# Patient Record
Sex: Male | Born: 2000
Health system: Southern US, Community
[De-identification: ages and names within clinical notes are randomized; demographics above are authoritative.]

---
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
## 2019-05-11 ENCOUNTER — Emergency Department (HOSPITAL_COMMUNITY): Payer: Medicaid Other

## 2019-05-11 ENCOUNTER — Encounter (HOSPITAL_COMMUNITY): Payer: Self-pay | Admitting: Emergency Medicine

## 2019-05-11 ENCOUNTER — Other Ambulatory Visit: Payer: Self-pay

## 2019-05-11 ENCOUNTER — Emergency Department (HOSPITAL_COMMUNITY)
Admission: EM | Admit: 2019-05-11 | Discharge: 2019-05-11 | Disposition: A | Discharge status: Home / Self Care | Payer: Medicaid Other | Attending: Emergency Medicine | Admitting: Emergency Medicine

## 2019-05-11 ENCOUNTER — Telehealth: Payer: Self-pay | Admitting: Physician Assistant

## 2019-05-11 DIAGNOSIS — R590 Localized enlarged lymph nodes: Secondary | ICD-10-CM | POA: Diagnosis not present

## 2019-05-11 DIAGNOSIS — R221 Localized swelling, mass and lump, neck: Secondary | ICD-10-CM | POA: Diagnosis present

## 2019-05-11 DIAGNOSIS — R591 Generalized enlarged lymph nodes: Secondary | ICD-10-CM

## 2019-05-11 LAB — COMPREHENSIVE METABOLIC PANEL
ALT: 42 U/L (ref 0–44)
AST: 30 U/L (ref 15–41)
Albumin: 2.3 g/dL — ABNORMAL LOW (ref 3.5–5.0)
Alkaline Phosphatase: 216 U/L — ABNORMAL HIGH (ref 38–126)
Anion gap: 12 (ref 5–15)
BUN: <5 mg/dL — ABNORMAL LOW (ref 6–20)
CO2: 25 mmol/L (ref 22–32)
Calcium: 8.8 mg/dL — ABNORMAL LOW (ref 8.9–10.3)
Chloride: 95 mmol/L — ABNORMAL LOW (ref 98–111)
Creatinine, Ser: 0.62 mg/dL (ref 0.61–1.24)
GFR calc Af Amer: >60 mL/min (ref >60)
GFR calc non Af Amer: >60 mL/min (ref >60)
Glucose, Bld: 94 mg/dL (ref 70–99)
Potassium: 3.9 mmol/L (ref 3.5–5.1)
Sodium: 132 mmol/L — ABNORMAL LOW (ref 135–145)
Total Bilirubin: 0.4 mg/dL (ref 0.3–1.2)
Total Protein: 8.8 g/dL — ABNORMAL HIGH (ref 6.5–8.1)

## 2019-05-11 LAB — CBC WITH DIFFERENTIAL/PLATELET
Abs Immature Granulocytes: 0 10*3/uL (ref 0.00–0.07)
Basophils Absolute: 0 10*3/uL (ref 0.0–0.1)
Basophils Relative: 0 %
Eosinophils Absolute: 0 10*3/uL (ref 0.0–0.5)
Eosinophils Relative: 0 %
HCT: 27.5 % — ABNORMAL LOW (ref 39.0–52.0)
Hemoglobin: 8.3 g/dL — ABNORMAL LOW (ref 13.0–17.0)
Lymphocytes Relative: 5 %
Lymphs Abs: 1.3 10*3/uL (ref 0.7–4.0)
MCH: 22.4 pg — ABNORMAL LOW (ref 26.0–34.0)
MCHC: 30.2 g/dL (ref 30.0–36.0)
MCV: 74.1 fL — ABNORMAL LOW (ref 80.0–100.0)
Monocytes Absolute: 1.5 10*3/uL — ABNORMAL HIGH (ref 0.1–1.0)
Monocytes Relative: 6 %
Neutro Abs: 22.7 10*3/uL — ABNORMAL HIGH (ref 1.7–7.7)
Neutrophils Relative %: 89 %
Platelets: 478 10*3/uL — ABNORMAL HIGH (ref 150–400)
RBC: 3.71 MIL/uL — ABNORMAL LOW (ref 4.22–5.81)
RDW: 18.8 % — ABNORMAL HIGH (ref 11.5–15.5)
WBC: 25.5 10*3/uL — ABNORMAL HIGH (ref 4.0–10.5)
nRBC: 0 % (ref 0.0–0.2)
nRBC: 0 /100 WBC

## 2019-05-11 LAB — LIPASE, BLOOD: Lipase: 18 U/L (ref 11–51)

## 2019-05-11 MED ORDER — ONDANSETRON 4 MG PO TBDP: ORAL_TABLET | ORAL | 0 refills | Status: DC

## 2019-05-11 MED ORDER — IOHEXOL 350 MG/ML SOLN
100.0000 mL | Freq: Once | INTRAVENOUS | Status: AC | PRN
  Administered 2019-05-11: 100 mL via INTRAVENOUS

## 2019-05-11 MED ORDER — SODIUM CHLORIDE 0.9 % IV BOLUS
1000.0000 mL | Freq: Once | INTRAVENOUS | Status: AC
  Administered 2019-05-11: 1000 mL via INTRAVENOUS

## 2019-05-11 MED ORDER — SODIUM CHLORIDE 0.9 % IV SOLN
Freq: Once | INTRAVENOUS | Status: AC
  Administered 2019-05-11: 17:00:00 via INTRAVENOUS

## 2019-05-11 MED ORDER — IOHEXOL 300 MG/ML  SOLN
75.0000 mL | Freq: Once | INTRAMUSCULAR | Status: AC | PRN
Start: 2019-05-11 — End: 2019-05-11
  Administered 2019-05-11: 75 mL via INTRAVENOUS

## 2019-05-11 NOTE — Discharge Instructions (Signed)
Your CT scans are concerning for lymphoma and your lab work today shows increased white blood cells and low red blood cells which can also be seen with lymphoma.  You should receive a phone call from the cancer center for follow-up appointment in the next few days, if you do not hear from them please give them a call.  Return to the ED for any new or worsening symptoms.

## 2019-05-11 NOTE — ED Provider Notes (Addendum)
[facility] EMERGENCY DEPARTMENT Provider Note   CSN: [id number] Arrival date & time: 05/11/19  1216     History   Chief Complaint Chief Complaint  Patient presents with   neck swelling    HPI Child Campoy is a 18 y.o. male.     Jatavious Cartwright is a 18 y.o. male with a history of obesity, depression and ODD, who presents for evaluation of a left sided neck mass. Patient reports about 3-4 months ago he noticed a small movable bump at the base of the left side of his neck.  Over the past few months it has become increasingly large and intermittently painful.  He reports it is somewhat movable.  He reports since it has gotten larger it has caused him more pain.  He denies it causing sore throat or difficulty breathing.  He has not had any associated fevers or chills.  But does report that since this started he has noted that he has had a more sensitive stomach with frequent nausea and intermittent diarrhea, he reports some intermittent generalized abdominal pains but no focal pain in one area, and denies any current abdominal pain.  Denies any pain in his chest or shortness of breath.  He does report that over the past few months he has noted an unintentional weight loss of an estimated 20 pounds.  He reports that he thinks his mom had a similar issue that they said was cancer and she had to have a surgery on her neck.  He reports he does not have insurance and has avoided coming in regarding this, he has never been in hospital before and is very anxious.     History reviewed. No pertinent past medical history.  Patient Active Problem List   Diagnosis Date Noted   Morbid obesity ([place]) 05/31/2015   MDD (major depressive disorder), single episode, severe ([place]) 05/26/2015   ODD (oppositional defiant disorder) 05/25/2015    History reviewed. No pertinent surgical history.      Home Medications    Prior to Admission medications   Medication Sig Start  Date End Date Taking? Authorizing Provider  ondansetron (ZOFRAN ODT) 4 MG disintegrating tablet 40m ODT q4 hours prn nausea/vomit 05/11/19   FJacqlyn Larsen PA-C    Family History No family history on file.  Social History Social History   Tobacco Use   Smoking status: Never Smoker  Substance Use Topics   Alcohol use: No   Drug use: No     Allergies   Patient has no known allergies.   Review of Systems Review of Systems  Constitutional: Positive for appetite change and unexpected weight change. Negative for chills and fever.  HENT: Negative for facial swelling, sore throat, trouble swallowing and voice change.   Respiratory: Negative for cough and shortness of breath.   Cardiovascular: Negative for chest pain.  Gastrointestinal: Positive for abdominal pain, diarrhea, nausea and vomiting. Negative for blood in stool and constipation.  Musculoskeletal: Positive for neck pain (Neck mass). Negative for arthralgias and back pain.  Skin: Negative for color change, rash and wound.  Neurological: Negative for headaches.  All other systems reviewed and are negative.    Physical Exam Updated Vital Signs BP (!) 147/82    Pulse (!) 118    Temp 98.5 F (36.9 C) (Oral)    Resp 15    Ht 6' (1.829 m)    Wt 127 kg    SpO2 100%    BMI 37.97 kg/m  Physical Exam Vitals signs and nursing note reviewed.  Constitutional:      General: He is not in acute distress.    Appearance: Normal appearance. He is well-developed. He is not ill-appearing or diaphoretic.  HENT:     Head: Normocephalic and atraumatic.     Mouth/Throat:     Mouth: Mucous membranes are moist.     Pharynx: Oropharynx is clear.     Comments: Posterior oropharynx clear with no erythema, edema, or exudates.  Uvula midline.  Tolerating secretions, membranes moist. Eyes:     General:        Right eye: No discharge.        Left eye: No discharge.     Pupils: Pupils are equal, round, and reactive to light.  Neck:      Musculoskeletal: Neck supple.     Comments: Bilateral significantly enlarged lymph nodes palpable at the base of the neck, left worse than right, slightly tender to palpation, movable on exam.  No overlying skin changes. No stridor on auscultation Cardiovascular:     Rate and Rhythm: Regular rhythm. Tachycardia present.     Heart sounds: Normal heart sounds.     Comments: Tachycardia with regular rhythm Pulmonary:     Effort: Pulmonary effort is normal. No respiratory distress.     Breath sounds: Normal breath sounds. No wheezing or rales.     Comments: Respirations equal and unlabored, patient able to speak in full sentences, lungs clear to auscultation bilaterally Abdominal:     General: Bowel sounds are normal. There is no distension.     Palpations: Abdomen is soft. There is no mass.     Tenderness: There is no abdominal tenderness. There is no guarding.     Comments: Abdomen soft, nondistended, nontender to palpation in all quadrants without guarding or peritoneal signs  Musculoskeletal:        General: No deformity.  Lymphadenopathy:     Cervical: Cervical adenopathy present.  Skin:    General: Skin is warm and dry.     Capillary Refill: Capillary refill takes less than 2 seconds.  Neurological:     Mental Status: He is alert.     Coordination: Coordination normal.     Comments: Speech is clear, able to follow commands Moves extremities without ataxia, coordination intact  Psychiatric:        Mood and Affect: Mood is anxious.        Behavior: Behavior normal.      ED Treatments / Results  Labs (all labs ordered are listed, but only abnormal results are displayed) Labs Reviewed  CBC WITH DIFFERENTIAL/PLATELET - Abnormal; Notable for the following components:      Result Value   WBC 25.5 (*)    RBC 3.71 (*)    Hemoglobin 8.3 (*)    HCT 27.5 (*)    MCV 74.1 (*)    MCH 22.4 (*)    RDW 18.8 (*)    Platelets 478 (*)    Neutro Abs 22.7 (*)    Monocytes Absolute 1.5  (*)    All other components within normal limits  COMPREHENSIVE METABOLIC PANEL - Abnormal; Notable for the following components:   Sodium 132 (*)    Chloride 95 (*)    BUN <5 (*)    Calcium 8.8 (*)    Total Protein 8.8 (*)    Albumin 2.3 (*)    Alkaline Phosphatase 216 (*)    All other components within normal limits  LIPASE,  BLOOD    EKG None  Radiology Ct Soft Tissue Neck W Contrast  Result Date: 05/11/2019 CLINICAL DATA:  Painful left-sided neck mass. EXAM: CT NECK WITH CONTRAST TECHNIQUE: Multidetector CT imaging of the neck was performed using the standard protocol following the bolus administration of intravenous contrast. CONTRAST:  24m OMNIPAQUE IOHEXOL 300 MG/ML  SOLN COMPARISON:  None. FINDINGS: Pharynx and larynx: No focal mucosal or submucosal lesions are present. Adenoid tissue is within normal limits. Soft palate and tongue base are normal. Vallecular is normal. Hypopharynx is within normal limits. Vocal cords are midline and symmetric. The trachea is normal. Salivary glands: Submandibular and parotid glands and ducts are within normal limits. Thyroid: Normal Lymph nodes: Multiple bulky lymph nodes are present bilaterally. Homogeneous node in the left level 2 station is likely palpable, measuring 3.4 x 2.3 x 3.0 cm. Left posterior level 2 and level 3 lymph node measures up to 3.6 cm. A left level 3 node or nodal mass measures 4.6 x 3.7 x 5.3 cm. Enlarged supraclavicular nodes are present bilaterally, right greater than left. Marked adenopathy extends into the upper mediastinum. The largest right paratracheal node measures 5.2 x 3.5 cm. Left prevascular nodes measure up to 4.3 x 4.1 cm. Vascular: The right internal jugular vein is displaced anteriorly without occlusion. No arterial lesions are present. Limited intracranial: Within normal limits. Visualized orbits: The globes and orbits are within normal limits. Mastoids and visualized paranasal sinuses: The paranasal sinuses and  mastoid air cells are clear. Skeleton: Vertebral body heights alignment are normal. No focal lytic or blastic lesions are present. Upper chest: The lung apices are clear. IMPRESSION: 1. Extensive bulky adenopathy throughout the neck bilaterally, right greater than left. This is most concerning for a lymphoproliferative disorder such as lymphoma. 2. No primary lesion is evident. 3. The right internal jugular vein is displaced anteriorly without occlusion. These results were called by telephone at the time of interpretation on 05/11/2019 at 3:30 pm to provider [facility] , who verbally acknowledged these results. Electronically Signed   By: CSan MorelleM.D.   On: 05/11/2019 15:33   Ct Angio Chest Pe W And/or Wo Contrast  Result Date: 05/11/2019 CLINICAL DATA:  New diagnosis of probable lymphoma in the neck. Tachycardia. Rule out pulmonary embolism. Evaluate for adenopathy. EXAM: CT ANGIOGRAPHY CHEST CT ABDOMEN AND PELVIS WITH CONTRAST TECHNIQUE: Multidetector CT imaging of the chest was performed using the standard protocol during bolus administration of intravenous contrast. Multiplanar CT image reconstructions and MIPs were obtained to evaluate the vascular anatomy. Multidetector CT imaging of the abdomen and pelvis was performed using the standard protocol during bolus administration of intravenous contrast. CONTRAST:  1072mOMNIPAQUE IOHEXOL 350 MG/ML SOLN COMPARISON:  None. FINDINGS: CTA CHEST FINDINGS Cardiovascular: The quality of this exam for evaluation of pulmonary embolism is poor to moderate. The bolus is suboptimally timed, with contrast centered in the SVC. No pulmonary embolism to the large lobar level. Normal aortic caliber. Mild cardiomegaly. Small pericardial effusion. Mediastinum/Nodes: Massive low cervical/supraclavicular adenopathy, as on dedicated neck CT. Left axillary adenopathy including at 1.5 cm on 29/5. Confluent mediastinal adenopathy, with a nodal conglomerate in the  prevascular space measuring on the order of 8.9 x 6.2 cm on 41/5. Right paratracheal node measures 4.4 cm on image 43/5. Right hilar adenopathy at 2.8 cm on 56/5. Right paravertebral adenopathy versus extramedullary hematopoiesis. Example at 1.9 x 4.2 cm on 120/5. Lungs/Pleura: No pleural fluid. Pleural-based nodules, including the right upper lobe at 7  mm on 57/6 most likely represent subpleural lymph nodes. No lobar consolidation. Musculoskeletal: Moderate bilateral gynecomastia. No acute osseous abnormality. Review of the MIP images confirms the above findings. CT ABDOMEN and PELVIS FINDINGS Hepatobiliary: Hepatomegaly at 23.8 cm craniocaudal. Mild hepatic steatosis with sparing adjacent the gallbladder. Normal gallbladder, without biliary ductal dilatation. Pancreas: Normal, without mass or ductal dilatation. Spleen: Splenomegaly, including at 16.7 x 10.7 x 13.2 cm. Heterogeneous splenic density, likely due to lymphomatous involvement. Example vague inferior splenic mass of 4.0 cm on 37/3. Adrenals/Urinary Tract: Normal adrenal glands. Normal kidneys, without hydronephrosis. Normal urinary bladder. Stomach/Bowel: Normal stomach, without wall thickening. Normal colon, appendix, and terminal ileum. Normal small bowel. Vascular/Lymphatic: Normal caliber of the aorta and branch vessels. Abdominal retroperitoneal adenopathy, with a 1.8 cm left periaortic node on 38/3. Porta hepatis node of 1.2 cm on 33/3. No pelvic sidewall adenopathy. Reproductive: Normal prostate. Other: No significant free fluid. Musculoskeletal: No acute osseous abnormality. Review of the MIP images confirms the above findings. IMPRESSION: 1. suboptimal evaluation for pulmonary embolism. No large embolism as detailed above. 2. Adenopathy within the chest and abdomen, consistent with lymphoma. 3. Hepatosplenomegaly with heterogeneous splenic enhancement/attenuation, suspicious for splenic involvement. 4. Paravertebral adenopathy versus  extramedullary hematopoiesis. 5. Bilateral gynecomastia. Electronically Signed   By: Abigail Miyamoto M.D.   On: 05/11/2019 17:26   Ct Abdomen Pelvis W Contrast  Result Date: 05/11/2019 CLINICAL DATA:  New diagnosis of probable lymphoma in the neck. Tachycardia. Rule out pulmonary embolism. Evaluate for adenopathy. EXAM: CT ANGIOGRAPHY CHEST CT ABDOMEN AND PELVIS WITH CONTRAST TECHNIQUE: Multidetector CT imaging of the chest was performed using the standard protocol during bolus administration of intravenous contrast. Multiplanar CT image reconstructions and MIPs were obtained to evaluate the vascular anatomy. Multidetector CT imaging of the abdomen and pelvis was performed using the standard protocol during bolus administration of intravenous contrast. CONTRAST:  173m OMNIPAQUE IOHEXOL 350 MG/ML SOLN COMPARISON:  None. FINDINGS: CTA CHEST FINDINGS Cardiovascular: The quality of this exam for evaluation of pulmonary embolism is poor to moderate. The bolus is suboptimally timed, with contrast centered in the SVC. No pulmonary embolism to the large lobar level. Normal aortic caliber. Mild cardiomegaly. Small pericardial effusion. Mediastinum/Nodes: Massive low cervical/supraclavicular adenopathy, as on dedicated neck CT. Left axillary adenopathy including at 1.5 cm on 29/5. Confluent mediastinal adenopathy, with a nodal conglomerate in the prevascular space measuring on the order of 8.9 x 6.2 cm on 41/5. Right paratracheal node measures 4.4 cm on image 43/5. Right hilar adenopathy at 2.8 cm on 56/5. Right paravertebral adenopathy versus extramedullary hematopoiesis. Example at 1.9 x 4.2 cm on 120/5. Lungs/Pleura: No pleural fluid. Pleural-based nodules, including the right upper lobe at 7 mm on 57/6 most likely represent subpleural lymph nodes. No lobar consolidation. Musculoskeletal: Moderate bilateral gynecomastia. No acute osseous abnormality. Review of the MIP images confirms the above findings. CT ABDOMEN and  PELVIS FINDINGS Hepatobiliary: Hepatomegaly at 23.8 cm craniocaudal. Mild hepatic steatosis with sparing adjacent the gallbladder. Normal gallbladder, without biliary ductal dilatation. Pancreas: Normal, without mass or ductal dilatation. Spleen: Splenomegaly, including at 16.7 x 10.7 x 13.2 cm. Heterogeneous splenic density, likely due to lymphomatous involvement. Example vague inferior splenic mass of 4.0 cm on 37/3. Adrenals/Urinary Tract: Normal adrenal glands. Normal kidneys, without hydronephrosis. Normal urinary bladder. Stomach/Bowel: Normal stomach, without wall thickening. Normal colon, appendix, and terminal ileum. Normal small bowel. Vascular/Lymphatic: Normal caliber of the aorta and branch vessels. Abdominal retroperitoneal adenopathy, with a 1.8 cm left periaortic node on 38/3.  Porta hepatis node of 1.2 cm on 33/3. No pelvic sidewall adenopathy. Reproductive: Normal prostate. Other: No significant free fluid. Musculoskeletal: No acute osseous abnormality. Review of the MIP images confirms the above findings. IMPRESSION: 1. suboptimal evaluation for pulmonary embolism. No large embolism as detailed above. 2. Adenopathy within the chest and abdomen, consistent with lymphoma. 3. Hepatosplenomegaly with heterogeneous splenic enhancement/attenuation, suspicious for splenic involvement. 4. Paravertebral adenopathy versus extramedullary hematopoiesis. 5. Bilateral gynecomastia. Electronically Signed   By: Abigail Miyamoto M.D.   On: 05/11/2019 17:26    Procedures Procedures (including critical care time)  Medications Ordered in ED Medications  sodium chloride 0.9 % bolus 1,000 mL (0 mLs Intravenous Stopped 05/11/19 1704)  iohexol (OMNIPAQUE) 300 MG/ML solution 75 mL (75 mLs Intravenous Contrast Given 05/11/19 1519)  0.9 %  sodium chloride infusion ( Intravenous Stopped 05/11/19 1845)  iohexol (OMNIPAQUE) 350 MG/ML injection 100 mL (100 mLs Intravenous Contrast Given 05/11/19 1653)     Initial  Impression / Assessment and Plan / ED Course  I have reviewed the triage vital signs and the nursing notes.  Pertinent labs & imaging results that were available during my care of the patient were reviewed by me and considered in my medical decision making (see chart for details).  18 year old male presents with large mass at the base of his neck suspicious for enlarged lymph nodes he has similar but smaller mass on the left as well as some additional smaller cervical lymphadenopathy.  Patient reports this has been enlarging over the past 3 to 4 months.  Patient also reports associated night sweats and a 20 pound weight loss over the past 3 to 4 months.  On arrival patient is tachycardic but afebrile, he does endorse that he is very anxious and does not like coming to hospitals.  He has no other focal infectious symptoms.  Posterior oropharynx is clear and patient has no stridor.  Lungs are clear and abdomen is nontender to palpation.  Patient does report that he has had some frequent nausea and vomiting and some occasional diarrhea but does not have any current abdominal pain.  Will get basic labs and lipase and CT soft tissue neck with contrast to evaluate neck masses, very concerned for enlarged lymph nodes and with patient's associated weight loss and night sweats concern for potential lymphoma.  Labs show a leukocytosis of 25.5 with left shift and new anemia of 8.3, platelets of 478, again this raises concern for hematologic cancer.  Patient does not have fevers or focal infectious symptoms to suggest infectious etiology for leukocytosis.  Patient with mild hyponatremia and hypochloremia, given IV fluids, alk phos elevated, all other LFTs normal and patient without focal right upper quadrant tenderness, suspect this is in the setting of malignancy.  CT soft tissue neck with extensive enlarged lymph nodes in the neck bilaterally concerning for a lymphoproliferative disorder such as lymphoma, no evident  primary lesion.  The right internal jugular vein is displaced anteriorly but no evidence of occlusion.  I was called by Dr. Lawrence Santiago with radiology regarding these results.  Will consult oncology.  Case discussed with Dr. Earlie Server with oncology who recommends getting CTs of the chest abdomen and pelvis, but feels that this can be treated outpatient and patient will not require admission for further evaluation.  Given patient's tachycardia will get CT angio of the chest to rule out PE and CT abdomen pelvis with contrast to help better characterize spread of potential lymphoma.  CTA suboptimal but shows  no large PE patient is not having any chest pain or shortness of breath., adenopathy within the chest and abdomen consistent with lymphoma as well as hepatosplenomegaly with heterogeneous splenic enhancement suggestive of spleen involvement.  There is also paravertebral adenopathy versus extra medullary hematopoiesis noted.  Discussed results with patient and the concern for lymphoma.  Patient expresses understanding and I provided ample time to ask questions. I stressed the importance of follow-up, discussed with patient that he should hear from the oncology clinic in the next few days but if not he will call them to schedule follow-up.  Will prescribe Zofran for patient's intermittent nausea and vomiting.  Encouraged frequent small meals and drinking plenty of water.  Return precautions discussed.  Patient expresses understanding and agreement, discharged home in good condition.  Final Clinical Impressions(s) / ED Diagnoses   Final diagnoses:  Lymphadenopathy    ED Discharge Orders         Ordered    Ambulatory referral to Hematology / Oncology    Comments: Newly diagnosed lymphoma, discussed with Dr. Earlie Server   05/11/19 1816    ondansetron (ZOFRAN ODT) 4 MG disintegrating tablet     05/11/19 1843           Jacqlyn Larsen, PA-C 05/11/19 1904    Jacqlyn Larsen, PA-C 05/11/19  [place], DO 05/12/19 [phone]

## 2019-05-11 NOTE — ED Notes (Signed)
Patient transported to CT 

## 2019-05-11 NOTE — ED Triage Notes (Signed)
Pt reports swelling to L side of neck for the past few months, states lesion was initially a small bump under his skin that was moveable and lesion has gotten larger.

## 2019-05-11 NOTE — Telephone Encounter (Signed)
Received a new patient referral via staff msg from Dr. Julien Nordmann to schedule an appt w/either him or Cassie for lymphoma. I scheduled Terry Quinn to see Cassie on 12/10 at 1pm. Pt is currently in the hospital.

## 2019-05-13 ENCOUNTER — Ambulatory Visit: Payer: Self-pay | Admitting: Physician Assistant

## 2019-05-14 ENCOUNTER — Telehealth: Payer: Self-pay | Admitting: Physician Assistant

## 2019-05-14 ENCOUNTER — Telehealth: Payer: Self-pay | Admitting: Internal Medicine

## 2019-05-14 ENCOUNTER — Ambulatory Visit: Payer: Self-pay | Admitting: Physician Assistant

## 2019-05-14 ENCOUNTER — Telehealth: Payer: Self-pay | Admitting: *Deleted

## 2019-05-14 NOTE — Telephone Encounter (Signed)
Mr. Terry Quinn has been cld and rescheduled to see Dr. Julien Nordmann on 12/11 at [facility]. Pt aware to arrive 15 minutes early.

## 2019-05-14 NOTE — Telephone Encounter (Signed)
Received call from pt stating that he missed call yesterday.  Informed of appt today.  He states he needs to r/s.  Message to new pt coordinator.

## 2019-05-14 NOTE — Telephone Encounter (Signed)
Pt has been moved to Cassie's schedule for 12/11 at 9am.

## 2019-05-15 ENCOUNTER — Telehealth: Payer: Self-pay | Admitting: Physician Assistant

## 2019-05-15 ENCOUNTER — Ambulatory Visit: Payer: Self-pay | Admitting: Physician Assistant

## 2019-05-15 ENCOUNTER — Other Ambulatory Visit: Payer: Self-pay | Admitting: Physician Assistant

## 2019-05-15 DIAGNOSIS — R591 Generalized enlarged lymph nodes: Secondary | ICD-10-CM

## 2019-05-15 NOTE — Telephone Encounter (Signed)
I spoke to the patient about his appointment today. The patient did not show up for either of his scheduled appointments on 12/10 and 12/11. The patient stated that he did not have a ride to his appointment today and got on the incorrect bus. I discussed with the patient that we would still like to see him today if he is able to make it here. He stated that he could not come in today. I will reach out to our transportation coordinator to see if she is able to assist him with his transportation needs. We will reschedule him for Monday 05/18/2019 at 11:30. He expressed understanding.

## 2019-05-15 NOTE — Telephone Encounter (Signed)
Pt has been rescheduled to see Cassie on 12/14 at 1130am w/labs at 11am.

## 2019-05-18 ENCOUNTER — Inpatient Hospital Stay: Payer: Medicaid Other

## 2019-05-18 ENCOUNTER — Telehealth: Payer: Self-pay | Admitting: Physician Assistant

## 2019-05-18 ENCOUNTER — Other Ambulatory Visit: Payer: Self-pay

## 2019-05-18 ENCOUNTER — Inpatient Hospital Stay: Payer: Medicaid Other | Attending: Physician Assistant | Admitting: Physician Assistant

## 2019-05-18 VITALS — BP 135/76 | HR 109 | Temp 98.0°F | Resp 18 | Ht 72.0 in | Wt 224.4 lb

## 2019-05-18 DIAGNOSIS — K760 Fatty (change of) liver, not elsewhere classified: Secondary | ICD-10-CM | POA: Diagnosis not present

## 2019-05-18 DIAGNOSIS — R911 Solitary pulmonary nodule: Secondary | ICD-10-CM | POA: Diagnosis not present

## 2019-05-18 DIAGNOSIS — Z560 Unemployment, unspecified: Secondary | ICD-10-CM | POA: Insufficient documentation

## 2019-05-18 DIAGNOSIS — R590 Localized enlarged lymph nodes: Secondary | ICD-10-CM | POA: Insufficient documentation

## 2019-05-18 DIAGNOSIS — Z20828 Contact with and (suspected) exposure to other viral communicable diseases: Secondary | ICD-10-CM | POA: Insufficient documentation

## 2019-05-18 DIAGNOSIS — R591 Generalized enlarged lymph nodes: Secondary | ICD-10-CM | POA: Diagnosis not present

## 2019-05-18 DIAGNOSIS — R0609 Other forms of dyspnea: Secondary | ICD-10-CM | POA: Diagnosis not present

## 2019-05-18 DIAGNOSIS — R5383 Other fatigue: Secondary | ICD-10-CM | POA: Insufficient documentation

## 2019-05-18 DIAGNOSIS — D649 Anemia, unspecified: Secondary | ICD-10-CM | POA: Insufficient documentation

## 2019-05-18 DIAGNOSIS — R162 Hepatomegaly with splenomegaly, not elsewhere classified: Secondary | ICD-10-CM | POA: Insufficient documentation

## 2019-05-18 DIAGNOSIS — R000 Tachycardia, unspecified: Secondary | ICD-10-CM | POA: Insufficient documentation

## 2019-05-18 DIAGNOSIS — R112 Nausea with vomiting, unspecified: Secondary | ICD-10-CM | POA: Diagnosis not present

## 2019-05-18 DIAGNOSIS — Z8249 Family history of ischemic heart disease and other diseases of the circulatory system: Secondary | ICD-10-CM | POA: Insufficient documentation

## 2019-05-18 DIAGNOSIS — R0602 Shortness of breath: Secondary | ICD-10-CM | POA: Diagnosis not present

## 2019-05-18 DIAGNOSIS — I313 Pericardial effusion (noninflammatory): Secondary | ICD-10-CM | POA: Insufficient documentation

## 2019-05-18 DIAGNOSIS — I77819 Aortic ectasia, unspecified site: Secondary | ICD-10-CM | POA: Diagnosis not present

## 2019-05-18 DIAGNOSIS — R634 Abnormal weight loss: Secondary | ICD-10-CM | POA: Insufficient documentation

## 2019-05-18 DIAGNOSIS — C782 Secondary malignant neoplasm of pleura: Secondary | ICD-10-CM | POA: Insufficient documentation

## 2019-05-18 DIAGNOSIS — Z79899 Other long term (current) drug therapy: Secondary | ICD-10-CM | POA: Insufficient documentation

## 2019-05-18 DIAGNOSIS — N62 Hypertrophy of breast: Secondary | ICD-10-CM | POA: Diagnosis not present

## 2019-05-18 DIAGNOSIS — Z808 Family history of malignant neoplasm of other organs or systems: Secondary | ICD-10-CM | POA: Insufficient documentation

## 2019-05-18 DIAGNOSIS — C8110 Nodular sclerosis classical Hodgkin lymphoma, unspecified site: Secondary | ICD-10-CM | POA: Insufficient documentation

## 2019-05-18 DIAGNOSIS — R61 Generalized hyperhidrosis: Secondary | ICD-10-CM | POA: Insufficient documentation

## 2019-05-18 DIAGNOSIS — R197 Diarrhea, unspecified: Secondary | ICD-10-CM | POA: Insufficient documentation

## 2019-05-18 DIAGNOSIS — R7989 Other specified abnormal findings of blood chemistry: Secondary | ICD-10-CM | POA: Diagnosis not present

## 2019-05-18 LAB — CMP (CANCER CENTER ONLY)
ALT: 49 U/L — ABNORMAL HIGH (ref 0–44)
AST: 47 U/L — ABNORMAL HIGH (ref 15–41)
Albumin: 2.4 g/dL — ABNORMAL LOW (ref 3.5–5.0)
Alkaline Phosphatase: 248 U/L — ABNORMAL HIGH (ref 38–126)
Anion gap: 11 (ref 5–15)
BUN: 7 mg/dL (ref 6–20)
CO2: 28 mmol/L (ref 22–32)
Calcium: 9.3 mg/dL (ref 8.9–10.3)
Chloride: 95 mmol/L — ABNORMAL LOW (ref 98–111)
Creatinine: 0.71 mg/dL (ref 0.61–1.24)
GFR, Est AFR Am: >60 mL/min (ref >60)
GFR, Estimated: >60 mL/min (ref >60)
Glucose, Bld: 95 mg/dL (ref 70–99)
Potassium: 4.5 mmol/L (ref 3.5–5.1)
Sodium: 134 mmol/L — ABNORMAL LOW (ref 135–145)
Total Bilirubin: 0.5 mg/dL (ref 0.3–1.2)
Total Protein: 9.1 g/dL — ABNORMAL HIGH (ref 6.5–8.1)

## 2019-05-18 LAB — CBC WITH DIFFERENTIAL (CANCER CENTER ONLY)
Abs Immature Granulocytes: 1.54 10*3/uL — ABNORMAL HIGH (ref 0.00–0.07)
Basophils Absolute: 0.1 10*3/uL (ref 0.0–0.1)
Basophils Relative: 0 %
Eosinophils Absolute: 0 10*3/uL (ref 0.0–0.5)
Eosinophils Relative: 0 %
HCT: 25.6 % — ABNORMAL LOW (ref 39.0–52.0)
Hemoglobin: 7.6 g/dL — ABNORMAL LOW (ref 13.0–17.0)
Immature Granulocytes: 5 %
Lymphocytes Relative: 4 %
Lymphs Abs: 1.1 10*3/uL (ref 0.7–4.0)
MCH: 22.2 pg — ABNORMAL LOW (ref 26.0–34.0)
MCHC: 29.7 g/dL — ABNORMAL LOW (ref 30.0–36.0)
MCV: 74.6 fL — ABNORMAL LOW (ref 80.0–100.0)
Monocytes Absolute: 1.5 10*3/uL — ABNORMAL HIGH (ref 0.1–1.0)
Monocytes Relative: 5 %
Neutro Abs: 24.1 10*3/uL — ABNORMAL HIGH (ref 1.7–7.7)
Neutrophils Relative %: 86 %
Platelet Count: 466 10*3/uL — ABNORMAL HIGH (ref 150–400)
RBC: 3.43 MIL/uL — ABNORMAL LOW (ref 4.22–5.81)
RDW: 19 % — ABNORMAL HIGH (ref 11.5–15.5)
WBC Count: 28.5 10*3/uL — ABNORMAL HIGH (ref 4.0–10.5)
nRBC: 0 % (ref 0.0–0.2)

## 2019-05-18 LAB — HEPATITIS PANEL, ACUTE
HCV Ab: NONREACTIVE
Hep A IgM: NONREACTIVE
Hep B C IgM: NONREACTIVE
Hepatitis B Surface Ag: NONREACTIVE

## 2019-05-18 LAB — SEDIMENTATION RATE: Sed Rate: 136 mm/hr — ABNORMAL HIGH (ref 0–16)

## 2019-05-18 LAB — LACTATE DEHYDROGENASE: LDH: 514 U/L — ABNORMAL HIGH (ref 98–192)

## 2019-05-18 LAB — URIC ACID: Uric Acid, Serum: 6.6 mg/dL (ref 3.7–8.6)

## 2019-05-18 NOTE — Progress Notes (Signed)
[place] [phone]   [phone]  CONSULT NOTE  REFERRING PHYSICIAN: Benedetto Goad PA-C  REASON FOR CONSULTATION:  Suspicious lymphoma   HPI Terry Quinn is a 18 y.o. male with a past medical history significant for depression with history of hospitalization for suicidal ideation in 2016, oppositional defiant disorder, and morbid obesity is referred to the clinic for evaluation of extensive lymphadenopathy which remains highly suspicious for lymphoma.  The patient presented to the emergency room on 05/11/2019 for the chief complaint of a left neck mass which had been enlarging over the past 3 to 4 months. The patient had a CT of the neck, chest, and abdomen and pelvis performed while in the ER to further characterize his condition.  His imaging showed extensive bulky adenopathy throughout the neck bilaterally, chest, and abdomen which remains highly suspicious for lymphoma. The scan also demonstrated hepatosplenomegaly with heterogeneous splenic enhancement/attenuation which is suspicious for splenic involvement. CBC from the ER showed leukocytosis with a WBC count of 25.5, microcytic anemia with a hemoglobin of 8.3, and elevated platelets at 478. He is here today for further evaluation and recommendations regarding his condition.  The patient's main complaint today is related to decreased exercise tolerance and significant fatigue.  He denies any recent fever, chills, or recent infections.  The patient endorses night sweats which has been occurring for approximately 2 months.  He also notes an approximately 10 to 15 pound unintentional weight loss over the course of the last 3-4 months.  He denies any nasal congestion, sore throat, dysuria, or skin infections.  He denies any chest pain, cough, or hemoptysis but reports shortness of breath with exertion.  He notes intermittent nausea and vomiting particularly with certain smells as the trigger.  He was  given a prescription for antiemetics while in the emergency room.  He denies any bleeding or bruising including epistaxis, gingival bleeding, hematuria, melena, or hematochezia. He used to donate blood but was told he was anemic at some point in the past during his blood donation.  He denies any history of any viral infections such as hepatitis, HIV, or Epstein-Barr virus. He mentions that he occasionally experiences diarrhea with one loose bowel movement every other day or so. He also noted that he has been having "itchy" patches all over his body that resolve spontaneously. He attributes this to possibly using new soap.   The patient's family history significant for a mother who had thyroid cancer and hypertension. The patient does not know his father's medical history.  The patient has a brother and a sister who are healthy.  The patient is currently unemployed.  He is single and does not have any children.  Patient denies any alcohol abuse.  He denies any history of cigarette smoking but endorses occasional marijuana use.  HPI  No past medical history on file.  No past surgical history on file.  No family history on file.  Social History Social History   Tobacco Use  . Smoking status: Never Smoker  Substance Use Topics  . Alcohol use: No  . Drug use: No    No Known Allergies  Current Outpatient Medications  Medication Sig Dispense Refill  . ondansetron (ZOFRAN ODT) 4 MG disintegrating tablet 76m ODT q4 hours prn nausea/vomit (Patient not taking: Reported on 05/18/2019) 10 tablet 0   No current facility-administered medications for this visit.    Review of Systems REVIEW OF SYSTEMS:   Review of Systems  Constitutional: Positive for  fatigue, night sweats, and unexpected weight loss. Negative for appetite change, chills, and fever.  HENT: Negative for mouth sores, nosebleeds, sore throat and trouble swallowing.   Eyes: Negative for eye problems and icterus.  Respiratory:  Positive for shortness of breath with exertion. Negative for cough, hemoptysis, and wheezing.  Cardiovascular: Negative for chest pain and leg swelling.  Gastrointestinal: Positive for intermittent nausea and vomiting and occassional diarrhea. Positive for occasional epigastric "cramping". Questionable early satiety. Negative for constipation.  Genitourinary: Negative for bladder incontinence, difficulty urinating, dysuria, frequency and hematuria.   Musculoskeletal: Negative for back pain, gait problem, neck pain and neck stiffness.  Skin: Positive for occasional pruritic rash "all over his body" (resolved at this time).  Neurological: Negative for dizziness, extremity weakness, gait problem, headaches, light-headedness and seizures.  Hematological: Positive for bilaterally cervical lymphadenopathy. Does not bruise/bleed easily.  Psychiatric/Behavioral: Negative for confusion, depression and sleep disturbance. The patient is not nervous/anxious.     PHYSICAL EXAMINATION:  Blood pressure 135/76, pulse (!) 109, temperature 98 F (36.7 C), temperature source Temporal, resp. rate 18, height 6' (1.829 m), weight 224 lb 6.4 oz (101.8 kg), SpO2 100 %.  ECOG PERFORMANCE STATUS: 1  Physical Exam  Constitutional: Oriented to person, place, and time and well-developed, well-nourished, and in no distress.  HENT:  Head: Normocephalic and atraumatic.  Mouth/Throat: Oropharynx is clear and moist. No oropharyngeal exudate.  Eyes: Conjunctivae are normal. Right eye exhibits no discharge. Left eye exhibits no discharge. No scleral icterus.  Neck: Normal range of motion. Neck supple.  Cardiovascular: Normal rate, regular rhythm, normal heart sounds and intact distal pulses.   Pulmonary/Chest: Effort normal and breath sounds normal. No respiratory distress. No wheezes. No rales.  Abdominal: Soft. Bowel sounds are normal. Exhibits no distension and no mass. There is no tenderness.  Musculoskeletal: Normal  range of motion. Exhibits no edema.  Lymphadenopathy:    Bulky cervical/supraclavicular lymphadenopathy bilaterally.  Neurological: Alert and oriented to person, place, and time. Exhibits normal muscle tone. Gait normal. Coordination normal.  Skin: Skin is warm and dry. No rash noted. Not diaphoretic. No erythema. No pallor.  Psychiatric: Mood, memory and judgment normal.  Vitals reviewed.  PERFORMANCE STATUS: ECOG 1  LABORATORY DATA: Lab Results  Component Value Date   WBC 28.5 (H) 05/18/2019   HGB 7.6 (L) 05/18/2019   HCT 25.6 (L) 05/18/2019   MCV 74.6 (L) 05/18/2019   PLT 466 (H) 05/18/2019      Chemistry      Component Value Date/Time   NA 134 (L) 05/18/2019 1044   K 4.5 05/18/2019 1044   CL 95 (L) 05/18/2019 1044   CO2 28 05/18/2019 1044   BUN 7 05/18/2019 1044   CREATININE 0.71 05/18/2019 1044      Component Value Date/Time   CALCIUM 9.3 05/18/2019 1044   ALKPHOS 248 (H) 05/18/2019 1044   AST 47 (H) 05/18/2019 1044   ALT 49 (H) 05/18/2019 1044   BILITOT 0.5 05/18/2019 1044       RADIOGRAPHIC STUDIES: CT Soft Tissue Neck W Contrast  Result Date: 05/11/2019 CLINICAL DATA:  Painful left-sided neck mass. EXAM: CT NECK WITH CONTRAST TECHNIQUE: Multidetector CT imaging of the neck was performed using the standard protocol following the bolus administration of intravenous contrast. CONTRAST:  17m OMNIPAQUE IOHEXOL 300 MG/ML  SOLN COMPARISON:  None. FINDINGS: Pharynx and larynx: No focal mucosal or submucosal lesions are present. Adenoid tissue is within normal limits. Soft palate and tongue base are normal. Vallecular is  normal. Hypopharynx is within normal limits. Vocal cords are midline and symmetric. The trachea is normal. Salivary glands: Submandibular and parotid glands and ducts are within normal limits. Thyroid: Normal Lymph nodes: Multiple bulky lymph nodes are present bilaterally. Homogeneous node in the left level 2 station is likely palpable, measuring 3.4 x 2.3  x 3.0 cm. Left posterior level 2 and level 3 lymph node measures up to 3.6 cm. A left level 3 node or nodal mass measures 4.6 x 3.7 x 5.3 cm. Enlarged supraclavicular nodes are present bilaterally, right greater than left. Marked adenopathy extends into the upper mediastinum. The largest right paratracheal node measures 5.2 x 3.5 cm. Left prevascular nodes measure up to 4.3 x 4.1 cm. Vascular: The right internal jugular vein is displaced anteriorly without occlusion. No arterial lesions are present. Limited intracranial: Within normal limits. Visualized orbits: The globes and orbits are within normal limits. Mastoids and visualized paranasal sinuses: The paranasal sinuses and mastoid air cells are clear. Skeleton: Vertebral body heights alignment are normal. No focal lytic or blastic lesions are present. Upper chest: The lung apices are clear. IMPRESSION: 1. Extensive bulky adenopathy throughout the neck bilaterally, right greater than left. This is most concerning for a lymphoproliferative disorder such as lymphoma. 2. No primary lesion is evident. 3. The right internal jugular vein is displaced anteriorly without occlusion. These results were called by telephone at the time of interpretation on 05/11/2019 at 3:30 pm to provider [facility] , who verbally acknowledged these results. Electronically Signed   By: San Morelle M.D.   On: 05/11/2019 15:33   CT Angio Chest PE W and/or Wo Contrast  Result Date: 05/11/2019 CLINICAL DATA:  New diagnosis of probable lymphoma in the neck. Tachycardia. Rule out pulmonary embolism. Evaluate for adenopathy. EXAM: CT ANGIOGRAPHY CHEST CT ABDOMEN AND PELVIS WITH CONTRAST TECHNIQUE: Multidetector CT imaging of the chest was performed using the standard protocol during bolus administration of intravenous contrast. Multiplanar CT image reconstructions and MIPs were obtained to evaluate the vascular anatomy. Multidetector CT imaging of the abdomen and pelvis was  performed using the standard protocol during bolus administration of intravenous contrast. CONTRAST:  125m OMNIPAQUE IOHEXOL 350 MG/ML SOLN COMPARISON:  None. FINDINGS: CTA CHEST FINDINGS Cardiovascular: The quality of this exam for evaluation of pulmonary embolism is poor to moderate. The bolus is suboptimally timed, with contrast centered in the SVC. No pulmonary embolism to the large lobar level. Normal aortic caliber. Mild cardiomegaly. Small pericardial effusion. Mediastinum/Nodes: Massive low cervical/supraclavicular adenopathy, as on dedicated neck CT. Left axillary adenopathy including at 1.5 cm on 29/5. Confluent mediastinal adenopathy, with a nodal conglomerate in the prevascular space measuring on the order of 8.9 x 6.2 cm on 41/5. Right paratracheal node measures 4.4 cm on image 43/5. Right hilar adenopathy at 2.8 cm on 56/5. Right paravertebral adenopathy versus extramedullary hematopoiesis. Example at 1.9 x 4.2 cm on 120/5. Lungs/Pleura: No pleural fluid. Pleural-based nodules, including the right upper lobe at 7 mm on 57/6 most likely represent subpleural lymph nodes. No lobar consolidation. Musculoskeletal: Moderate bilateral gynecomastia. No acute osseous abnormality. Review of the MIP images confirms the above findings. CT ABDOMEN and PELVIS FINDINGS Hepatobiliary: Hepatomegaly at 23.8 cm craniocaudal. Mild hepatic steatosis with sparing adjacent the gallbladder. Normal gallbladder, without biliary ductal dilatation. Pancreas: Normal, without mass or ductal dilatation. Spleen: Splenomegaly, including at 16.7 x 10.7 x 13.2 cm. Heterogeneous splenic density, likely due to lymphomatous involvement. Example vague inferior splenic mass of 4.0 cm on 37/3.  Adrenals/Urinary Tract: Normal adrenal glands. Normal kidneys, without hydronephrosis. Normal urinary bladder. Stomach/Bowel: Normal stomach, without wall thickening. Normal colon, appendix, and terminal ileum. Normal small bowel. Vascular/Lymphatic:  Normal caliber of the aorta and branch vessels. Abdominal retroperitoneal adenopathy, with a 1.8 cm left periaortic node on 38/3. Porta hepatis node of 1.2 cm on 33/3. No pelvic sidewall adenopathy. Reproductive: Normal prostate. Other: No significant free fluid. Musculoskeletal: No acute osseous abnormality. Review of the MIP images confirms the above findings. IMPRESSION: 1. suboptimal evaluation for pulmonary embolism. No large embolism as detailed above. 2. Adenopathy within the chest and abdomen, consistent with lymphoma. 3. Hepatosplenomegaly with heterogeneous splenic enhancement/attenuation, suspicious for splenic involvement. 4. Paravertebral adenopathy versus extramedullary hematopoiesis. 5. Bilateral gynecomastia. Electronically Signed   By: Abigail Miyamoto M.D.   On: 05/11/2019 17:26   CT ABDOMEN PELVIS W CONTRAST  Result Date: 05/11/2019 CLINICAL DATA:  New diagnosis of probable lymphoma in the neck. Tachycardia. Rule out pulmonary embolism. Evaluate for adenopathy. EXAM: CT ANGIOGRAPHY CHEST CT ABDOMEN AND PELVIS WITH CONTRAST TECHNIQUE: Multidetector CT imaging of the chest was performed using the standard protocol during bolus administration of intravenous contrast. Multiplanar CT image reconstructions and MIPs were obtained to evaluate the vascular anatomy. Multidetector CT imaging of the abdomen and pelvis was performed using the standard protocol during bolus administration of intravenous contrast. CONTRAST:  179m OMNIPAQUE IOHEXOL 350 MG/ML SOLN COMPARISON:  None. FINDINGS: CTA CHEST FINDINGS Cardiovascular: The quality of this exam for evaluation of pulmonary embolism is poor to moderate. The bolus is suboptimally timed, with contrast centered in the SVC. No pulmonary embolism to the large lobar level. Normal aortic caliber. Mild cardiomegaly. Small pericardial effusion. Mediastinum/Nodes: Massive low cervical/supraclavicular adenopathy, as on dedicated neck CT. Left axillary adenopathy  including at 1.5 cm on 29/5. Confluent mediastinal adenopathy, with a nodal conglomerate in the prevascular space measuring on the order of 8.9 x 6.2 cm on 41/5. Right paratracheal node measures 4.4 cm on image 43/5. Right hilar adenopathy at 2.8 cm on 56/5. Right paravertebral adenopathy versus extramedullary hematopoiesis. Example at 1.9 x 4.2 cm on 120/5. Lungs/Pleura: No pleural fluid. Pleural-based nodules, including the right upper lobe at 7 mm on 57/6 most likely represent subpleural lymph nodes. No lobar consolidation. Musculoskeletal: Moderate bilateral gynecomastia. No acute osseous abnormality. Review of the MIP images confirms the above findings. CT ABDOMEN and PELVIS FINDINGS Hepatobiliary: Hepatomegaly at 23.8 cm craniocaudal. Mild hepatic steatosis with sparing adjacent the gallbladder. Normal gallbladder, without biliary ductal dilatation. Pancreas: Normal, without mass or ductal dilatation. Spleen: Splenomegaly, including at 16.7 x 10.7 x 13.2 cm. Heterogeneous splenic density, likely due to lymphomatous involvement. Example vague inferior splenic mass of 4.0 cm on 37/3. Adrenals/Urinary Tract: Normal adrenal glands. Normal kidneys, without hydronephrosis. Normal urinary bladder. Stomach/Bowel: Normal stomach, without wall thickening. Normal colon, appendix, and terminal ileum. Normal small bowel. Vascular/Lymphatic: Normal caliber of the aorta and branch vessels. Abdominal retroperitoneal adenopathy, with a 1.8 cm left periaortic node on 38/3. Porta hepatis node of 1.2 cm on 33/3. No pelvic sidewall adenopathy. Reproductive: Normal prostate. Other: No significant free fluid. Musculoskeletal: No acute osseous abnormality. Review of the MIP images confirms the above findings. IMPRESSION: 1. suboptimal evaluation for pulmonary embolism. No large embolism as detailed above. 2. Adenopathy within the chest and abdomen, consistent with lymphoma. 3. Hepatosplenomegaly with heterogeneous splenic  enhancement/attenuation, suspicious for splenic involvement. 4. Paravertebral adenopathy versus extramedullary hematopoiesis. 5. Bilateral gynecomastia. Electronically Signed   By: KAbigail MiyamotoM.D.   On:  05/11/2019 17:26    ASSESSMENT: This is a very pleasant 18 year old Hispanic male who was referred to the clinic for evaluation for extensive bulky bilateral neck, chest, and abdominal lymphadenopathy which remains highly suspicious for lymphoma.    PLAN: Patient was seen with Dr. Julien Nordmann today.  Dr. Julien Nordmann recommends further evaluation of his condition. He had several labs studies performed today including a CBC, CMP, LDH, ESR, Epstein-Barr Barr serology, hepatitis panel, beta-2 microglobulin, and uric acid.  His CBC from today continues to show elevated WBC, anemia, and thrombocytosis.   Dr. Julien Nordmann recommends that we continue with the staging work-up and to confirm the diagnosis.  I sent a referral to interventional radiology for consideration of a bone marrow biopsy and aspirate to assess for bone marrow involvement as well as a ultrasound-guided needle core biopsy of the cervical lymph node to confirm the diagnosis.   I also will arrange for the patient to have a initial staging PET scan performed.  I have also placed an order for baseline PFTs and an echocardiogram to be performed which is needed prior to initiating chemotherapy.   The patient was also instructed to take an over-the-counter iron supplement for his anemia. I will add a sample to blood bank for his next lab draw in case he requires a blood transfusion in the near future.   We will see the patient back for follow-up visit in approximately 2 weeks for a more detailed discussion regarding his diagnosis and treatment options once we have the results of all of these studies.  The patient voices understanding of current disease status and treatment options and is in agreement with the current care plan.  All questions were  answered. The patient knows to call the clinic with any problems, questions or concerns. We can certainly see the patient much sooner if necessary.  Thank you so much for allowing me to participate in the care of Terry Quinn. I will continue to follow up the patient with you and assist in his care.  I spent 40 minutes counseling the patient face to face. The total time spent in the appointment was 60 minutes.  Disclaimer: This note was dictated with voice recognition software. Similar sounding words can inadvertently be transcribed and may not be corrected upon review.   Cassandra L Heilingoetter May 18, 2019, 1:19 PM    ADDENDUM: Hematology/Oncology Attending: I had a face-to-face encounter with the patient today.  I recommended his care plan.  This is a very pleasant 18 years old white male who presented to the emergency department a week ago complaining of progressive enlargement of lymphadenopathy in the left neck area.  Imaging studies including CT scan of the neck, chest, abdomen and pelvis showed extensive bulky adenopathy throughout the neck bilaterally right greater than left consistent with a lymphoproliferative disorder such as lymphoma.  The scan of the chest and abdomen showed adenopathy within the chest and abdomen with hepatosplenomegaly with heterogeneous splenic enhancement/attenuation suspicious for splenic involvement.  There was also paravertebral adenopathy versus extra medullary hematopoiesis. The patient was referred to me today for evaluation and recommendation regarding treatment of his condition. When seen today the patient continues to complain of unintentional weight loss as well as night sweats.  He has no recent viral infection.  He denied having any current chest pain or shortness of breath. This is likely a lymphoproliferative disorder like Hodgkin's lymphoma but non-Hodgkin lymphoma is also a possibility. I recommended for the patient to have several  studies  for evaluation of his lymphadenopathy including a PET scan as well as bone marrow biopsy and aspirate in addition to ultrasound-guided core biopsy of one of the supraclavicular enlarged lymph nodes.  We will also arrange for the patient to have 2D echo as well as pulmonary function test. We will order several serological studies today including hepatitis panel, HIV, EBV in addition to repeat CBC which showed pancytopenia as well as comprehensive metabolic panel, LDH and uric acid. The patient will come back for follow-up visit in 2 weeks for evaluation and more detailed discussion of his treatment options based on the final pathology report as well as the staging work-up. The patient was advised to call immediately if he has any concerning symptoms in the interval.  Disclaimer: This note was dictated with voice recognition software. Similar sounding words can inadvertently be transcribed and may be missed upon review. Eilleen Kempf, MD 05/18/19

## 2019-05-18 NOTE — Patient Instructions (Signed)
-  Please pick up over the counter iron supplements from your pharmacy and take 1 tablet daily.  -I have placed a referral to a department called interventional radiology. They need to do two procedures. They should hopefully be able to both on the same day. You will need a ultrasound biopsy of the lymph node in the neck. This is very important to confirm the diagnoses. You will also need a biopsy of your bone marrow to make sure that there is no involvement of the bone marrow.  -We will need a scan of your body to see all of the areas that are involved. This helps [place] stage your condition so we know how to best treat you -We will need you to get lung test to see what your lung function is before treatment.  -We also need an ultrasound of your heart to see what your heart function is before we start treatment.  -Please be on the lookout for a phone call from all of these departments. If you do not hear from one of the departments please call us at [phone]. Ask to talk to Dr. Worthy Flank nurse or [place].

## 2019-05-18 NOTE — Telephone Encounter (Signed)
I lft the pt a vm to confirm that he is coming to his scheduled appt w/Cassie for today.

## 2019-05-19 ENCOUNTER — Encounter (HOSPITAL_COMMUNITY): Payer: Self-pay

## 2019-05-19 ENCOUNTER — Telehealth: Payer: Self-pay | Admitting: Internal Medicine

## 2019-05-19 NOTE — Telephone Encounter (Signed)
Scheduled per los. Called and spoke with patient. Confirmed appt 

## 2019-05-19 NOTE — Progress Notes (Signed)
Terry Quinn Male, 18 y.o., 11/10/2000 MRN:  [id number] Phone:  [phone] ([phone] PCP:  Patient, No Pcp Per Primary Cvg:  None Next Appt With Radiology ([facility] PET) 05/22/2019 at 2:00 PM  RE: Biopsy Received: Yesterday Message Contents  Hassell, Daniel, MD  Graves, Carrielelia E      Same day or 1st avail.   Previous Messages  ----- Message -----  From: Graves, Carrielelia E  Sent: 05/18/2019  4:48 PM EST  To: Daniel Hassell, MD  Subject: RE: Biopsy                    The Bone Marrow Biopsy, when should that be scheduled?  ----- Message -----  From: Hassell, Daniel, MD  Sent: 05/18/2019  4:45 PM EST  To: Carrielelia E Graves  Subject: RE: Biopsy                    Ok   US core R supraclav LAN  R/o lymphoma    DD[facility]    ----- Message -----  From: Graves, Carrielelia E  Sent: 05/18/2019  3:38 PM EST  To: [facility] Procedure Requests  Subject: Biopsy                      Procedure Requested: US Biopsy (Lymph Node)   Reason for Procedure: Initial staging suspicious lymphoma. Left supraclavicular lymphadenopathy    Provider Requesting:  Heilingoetter, Cassandra L. PA-C  Provider Telephone: [phone]    Other Info: Future exams not yet scheduled: Bone Marrow Biopsy, PET, Echocardiogram limited   

## 2019-05-20 ENCOUNTER — Telehealth: Payer: Self-pay | Admitting: Physician Assistant

## 2019-05-20 LAB — BETA 2 MICROGLOBULIN, SERUM: Beta-2 Microglobulin: 2.8 mg/L — ABNORMAL HIGH (ref 0.6–2.4)

## 2019-05-20 NOTE — Telephone Encounter (Signed)
Gave the patient the number to schedule his PFT's. He expressed understanding and stated that he would call.

## 2019-05-21 LAB — EPSTEIN BARR VRS(EBV DNA BY PCR)
EBV DNA QN by PCR: 366 copies/mL
log10 EBV DNA Qn PCR: 2.563 log10 copy/mL

## 2019-05-22 ENCOUNTER — Other Ambulatory Visit: Payer: Self-pay | Admitting: Physician Assistant

## 2019-05-22 ENCOUNTER — Other Ambulatory Visit: Payer: Self-pay

## 2019-05-22 ENCOUNTER — Ambulatory Visit (HOSPITAL_COMMUNITY)
Admission: RE | Admit: 2019-05-22 | Discharge: 2019-05-22 | Disposition: A | Discharge status: Home / Self Care | Payer: Medicaid Other | Source: Ambulatory Visit | Attending: Physician Assistant | Admitting: Physician Assistant

## 2019-05-22 DIAGNOSIS — R591 Generalized enlarged lymph nodes: Secondary | ICD-10-CM

## 2019-05-22 LAB — GLUCOSE, CAPILLARY: Glucose-Capillary: 85 mg/dL (ref 70–99)

## 2019-05-22 MED ORDER — FLUDEOXYGLUCOSE F - 18 (FDG) INJECTION
12.0000 | Freq: Once | INTRAVENOUS | Status: AC | PRN
  Administered 2019-05-22: 13:00:00 12 via INTRAVENOUS

## 2019-05-25 ENCOUNTER — Other Ambulatory Visit: Payer: Self-pay

## 2019-05-25 ENCOUNTER — Ambulatory Visit (HOSPITAL_COMMUNITY)
Admission: RE | Admit: 2019-05-25 | Discharge: 2019-05-25 | Disposition: A | Discharge status: Home / Self Care | Payer: Medicaid Other | Source: Ambulatory Visit | Attending: Physician Assistant | Admitting: Physician Assistant

## 2019-05-25 DIAGNOSIS — I313 Pericardial effusion (noninflammatory): Secondary | ICD-10-CM | POA: Insufficient documentation

## 2019-05-25 DIAGNOSIS — Z01810 Encounter for preprocedural cardiovascular examination: Secondary | ICD-10-CM | POA: Diagnosis present

## 2019-05-25 DIAGNOSIS — R591 Generalized enlarged lymph nodes: Secondary | ICD-10-CM | POA: Diagnosis not present

## 2019-05-25 NOTE — Progress Notes (Signed)
  Echocardiogram 2D Echocardiogram has been performed.  Terry Quinn 05/25/2019, 2:24 PM

## 2019-05-26 ENCOUNTER — Other Ambulatory Visit: Payer: Self-pay | Admitting: Radiology

## 2019-05-27 ENCOUNTER — Ambulatory Visit (HOSPITAL_COMMUNITY)
Admission: RE | Admit: 2019-05-27 | Discharge: 2019-05-27 | Disposition: A | Discharge status: Home / Self Care | Payer: Medicaid Other | Source: Ambulatory Visit | Attending: Physician Assistant | Admitting: Physician Assistant

## 2019-05-27 ENCOUNTER — Encounter (HOSPITAL_COMMUNITY): Payer: Self-pay

## 2019-05-27 ENCOUNTER — Ambulatory Visit (HOSPITAL_COMMUNITY)
Admission: RE | Admit: 2019-05-27 | Discharge: 2019-05-27 | Disposition: A | Discharge status: Home / Self Care | Payer: Medicaid Other | Source: Ambulatory Visit | Attending: Diagnostic Radiology | Admitting: Diagnostic Radiology

## 2019-05-27 ENCOUNTER — Other Ambulatory Visit: Payer: Self-pay | Admitting: Physician Assistant

## 2019-05-27 ENCOUNTER — Other Ambulatory Visit: Payer: Self-pay

## 2019-05-27 DIAGNOSIS — R591 Generalized enlarged lymph nodes: Secondary | ICD-10-CM

## 2019-05-27 DIAGNOSIS — D72829 Elevated white blood cell count, unspecified: Secondary | ICD-10-CM | POA: Insufficient documentation

## 2019-05-27 DIAGNOSIS — D72822 Plasmacytosis: Secondary | ICD-10-CM | POA: Insufficient documentation

## 2019-05-27 DIAGNOSIS — C782 Secondary malignant neoplasm of pleura: Secondary | ICD-10-CM | POA: Diagnosis not present

## 2019-05-27 DIAGNOSIS — D509 Iron deficiency anemia, unspecified: Secondary | ICD-10-CM | POA: Insufficient documentation

## 2019-05-27 DIAGNOSIS — D7589 Other specified diseases of blood and blood-forming organs: Secondary | ICD-10-CM | POA: Diagnosis not present

## 2019-05-27 HISTORY — PX: IR US GUIDE BX ASP/DRAIN: IMG2392

## 2019-05-27 LAB — CBC WITH DIFFERENTIAL/PLATELET
Abs Immature Granulocytes: 1.62 10*3/uL — ABNORMAL HIGH (ref 0.00–0.07)
Basophils Absolute: 0.1 10*3/uL (ref 0.0–0.1)
Basophils Relative: 0 %
Eosinophils Absolute: 0 10*3/uL (ref 0.0–0.5)
Eosinophils Relative: 0 %
HCT: 25.3 % — ABNORMAL LOW (ref 39.0–52.0)
Hemoglobin: 7.6 g/dL — ABNORMAL LOW (ref 13.0–17.0)
Immature Granulocytes: 5 %
Lymphocytes Relative: 3 %
Lymphs Abs: 1 10*3/uL (ref 0.7–4.0)
MCH: 22.6 pg — ABNORMAL LOW (ref 26.0–34.0)
MCHC: 30 g/dL (ref 30.0–36.0)
MCV: 75.3 fL — ABNORMAL LOW (ref 80.0–100.0)
Monocytes Absolute: 1.5 10*3/uL — ABNORMAL HIGH (ref 0.1–1.0)
Monocytes Relative: 5 %
Neutro Abs: 27.6 10*3/uL — ABNORMAL HIGH (ref 1.7–7.7)
Neutrophils Relative %: 87 %
Platelets: 400 10*3/uL (ref 150–400)
RBC: 3.36 MIL/uL — ABNORMAL LOW (ref 4.22–5.81)
RDW: 18.8 % — ABNORMAL HIGH (ref 11.5–15.5)
WBC: 31.8 10*3/uL — ABNORMAL HIGH (ref 4.0–10.5)
nRBC: 0 % (ref 0.0–0.2)

## 2019-05-27 LAB — PROTIME-INR
INR: 1.4 — ABNORMAL HIGH (ref 0.8–1.2)
Prothrombin Time: 17.2 seconds — ABNORMAL HIGH (ref 11.4–15.2)

## 2019-05-27 MED ORDER — DIPHENHYDRAMINE HCL 50 MG/ML IJ SOLN
INTRAMUSCULAR | Status: AC
  Filled 2019-05-27: qty 1

## 2019-05-27 MED ORDER — FENTANYL CITRATE (PF) 100 MCG/2ML IJ SOLN
INTRAMUSCULAR | Status: AC | PRN
  Administered 2019-05-27 (×2): 50 ug via INTRAVENOUS

## 2019-05-27 MED ORDER — SODIUM CHLORIDE 0.9 % IV SOLN: INTRAVENOUS | Status: DC

## 2019-05-27 MED ORDER — FENTANYL CITRATE (PF) 100 MCG/2ML IJ SOLN
INTRAMUSCULAR | Status: AC
  Filled 2019-05-27: qty 4

## 2019-05-27 MED ORDER — DIPHENHYDRAMINE HCL 50 MG/ML IJ SOLN
INTRAMUSCULAR | Status: AC | PRN
  Administered 2019-05-27: 25 mg via INTRAVENOUS

## 2019-05-27 MED ORDER — LIDOCAINE HCL (PF) 1 % IJ SOLN
INTRAMUSCULAR | Status: AC | PRN
  Administered 2019-05-27: 10 mL
  Administered 2019-05-27: 5 mL

## 2019-05-27 MED ORDER — MIDAZOLAM HCL 2 MG/2ML IJ SOLN
INTRAMUSCULAR | Status: AC
  Filled 2019-05-27: qty 6

## 2019-05-27 MED ORDER — MIDAZOLAM HCL 2 MG/2ML IJ SOLN
INTRAMUSCULAR | Status: AC | PRN
  Administered 2019-05-27 (×3): 1 mg via INTRAVENOUS

## 2019-05-27 NOTE — Discharge Instructions (Signed)
Bone Marrow Aspiration and Bone Marrow Biopsy, Adult, Care After This sheet gives you information about how to care for yourself after your procedure. Your health care provider may also give you more specific instructions. If you have problems or questions, contact your health care provider. What can I expect after the procedure? After the procedure, it is common to have:  Mild pain and tenderness.  Swelling.  Bruising. Follow these instructions at home: Puncture site care  Follow instructions from your health care provider about how to take care of the puncture site. Make sure you: ? Wash your hands with soap and water before you change your bandage (dressing). If soap and water are not available, use hand sanitizer. ? Change your dressing as told by your health care provider.  Check your puncture siteevery day for signs of infection. Check for: ? More redness, swelling, or pain. ? More fluid or blood. ? Warmth. ? Pus or a bad smell. General instructions  Take over-the-counter and prescription medicines only as told by your health care provider.  Do not take baths, swim, or use a hot tub until your health care provider approves. Ask if you can take a shower or have a sponge bath.  Return to your normal activities as told by your health care provider. Ask your health care provider what activities are safe for you.  Do not drive for 24 hours if you were given a medicine to help you relax (sedative) during your procedure.  Keep all follow-up visits as told by your health care provider. This is important. Contact a health care provider if:  Your pain is not controlled with medicine. Get help right away if:  You have a fever.  You have more redness, swelling, or pain around the puncture site.  You have more fluid or blood coming from the puncture site.  Your puncture site feels warm to the touch.  You have pus or a bad smell coming from the puncture site. These symptoms  may represent a serious problem that is an emergency. Do not wait to see if the symptoms will go away. Get medical help right away. Call your local emergency services (911 in the [place] Do not drive yourself to the hospital. Summary  After the procedure, it is common to have mild pain, tenderness, swelling, and bruising.  Follow instructions from your health care provider about how to take care of the puncture site.  Get help right away if you have any symptoms of infection or if you have more blood or fluid coming from the puncture site. This information is not intended to replace advice given to you by your health care provider. Make sure you discuss any questions you have with your health care provider. Document Released: 12/08/2004 Document Revised: 09/03/2017 Document Reviewed: 11/02/2015 Elsevier Patient Education  2020 [employer].   Needle Biopsy, Care After These instructions tell you how to care for yourself after your procedure. Your doctor may also give you more specific instructions. Call your doctor if you have any problems or questions. What can I expect after the procedure? After the procedure, it is common to have:  Soreness.  Bruising.  Mild pain. Follow these instructions at home:   Return to your normal activities as told by your doctor. Ask your doctor what activities are safe for you.  Take over-the-counter and prescription medicines only as told by your doctor.  Wash your hands with soap and water before you change your bandage (dressing). If you cannot  use soap and water, use hand sanitizer.  Follow instructions from your doctor about: ? How to take care of your puncture site. ? When and how to change your bandage. ? When to remove your bandage.  Check your puncture site every day for signs of infection. Watch for: ? Redness, swelling, or pain. ? Fluid or blood. ? Pus or a bad smell. ? Warmth.  Do not take baths, swim, or use a hot tub until your  doctor approves. Ask your doctor if you may take showers. You may only be allowed to take sponge baths.  Keep all follow-up visits as told by your doctor. This is important. Contact a doctor if you have:  A fever.  Redness, swelling, or pain at the puncture site, and it lasts longer than a few days.  Fluid, blood, or pus coming from the puncture site.  Warmth coming from the puncture site. Get help right away if:  You have a lot of bleeding from the puncture site. Summary  After the procedure, it is common to have soreness, bruising, or mild pain at the puncture site.  Check your puncture site every day for signs of infection, such as redness, swelling, or pain.  Get help right away if you have severe bleeding from your puncture site. This information is not intended to replace advice given to you by your health care provider. Make sure you discuss any questions you have with your health care provider. Document Released: 05/03/2008 Document Revised: 06/03/2017 Document Reviewed: 06/03/2017 Elsevier Patient Education  2020 [employer].   Moderate Conscious Sedation, Adult, Care After These instructions provide you with information about caring for yourself after your procedure. Your health care provider may also give you more specific instructions. Your treatment has been planned according to current medical practices, but problems sometimes occur. Call your health care provider if you have any problems or questions after your procedure. What can I expect after the procedure? After your procedure, it is common:  To feel sleepy for several hours.  To feel clumsy and have poor balance for several hours.  To have poor judgment for several hours.  To vomit if you eat too soon. Follow these instructions at home: For at least 24 hours after the procedure:   Do not: ? Participate in activities where you could fall or become injured. ? Drive. ? Use heavy machinery. ? Drink  alcohol. ? Take sleeping pills or medicines that cause drowsiness. ? Make important decisions or sign legal documents. ? Take care of children on your own.  Rest. Eating and drinking  Follow the diet recommended by your health care provider.  If you vomit: ? Drink water, juice, or soup when you can drink without vomiting. ? Make sure you have little or no nausea before eating solid foods. General instructions  Have a responsible adult stay with you until you are awake and alert.  Take over-the-counter and prescription medicines only as told by your health care provider.  If you smoke, do not smoke without supervision.  Keep all follow-up visits as told by your health care provider. This is important. Contact a health care provider if:  You keep feeling nauseous or you keep vomiting.  You feel light-headed.  You develop a rash.  You have a fever. Get help right away if:  You have trouble breathing. This information is not intended to replace advice given to you by your health care provider. Make sure you discuss any questions you have with  your health care provider. Document Released: 03/11/2013 Document Revised: 05/03/2017 Document Reviewed: 09/10/2015 Elsevier Patient Education  2020 [employer].

## 2019-05-27 NOTE — Consult Note (Signed)
Chief Complaint: Patient was seen in consultation today for CT-guided bone marrow biopsy and image guided right supraclavicular lymph node biopsy   Referring Physician(s): Heilingoetter,Cassandra L/ Mohamed,M  Supervising Physician: Markus Daft  Patient Status: Terry Quinn  History of Present Illness: Terry Quinn is a 18 y.o. male smoker with past medical history significant for depression, suicidal ideation in 2016, oppositional defiant disorder, obesity, and enlarging left neck masses/lymphadenopathy suspicious for lymphoma.  Subsequent imaging has revealed bulky hypermetabolic, cervical and mediastinal adenopathy with metastases to the pleural space in the right lung, mildly enlarged hypermetabolic retroperitoneal and periaortic adenopathy as well as iliac adenopathy, diffuse hypermetabolic activity in the marrow and multiple foci of hypermetabolic activity in spleen worrisome for high-grade lymphoma.  He presents today for CT-guided bone marrow biopsy as well as right supraclavicular lymph node biopsy for further evaluation.  No past medical history on file.  No past surgical history on file.  Allergies: Patient has no known allergies.  Medications: Prior to Admission medications   Medication Sig Start Date End Date Taking? Authorizing Provider  ondansetron (ZOFRAN ODT) 4 MG disintegrating tablet 76m ODT q4 hours prn nausea/vomit Patient not taking: Reported on 05/18/2019 05/11/19   FJacqlyn Larsen PA-C     No family history on file.  Social History   Socioeconomic History   Marital status: Single    Spouse name: Not on file   Number of children: Not on file   Years of education: Not on file   Highest education level: Not on file  Occupational History   Not on file  Tobacco Use   Smoking status: Never Smoker  Substance and Sexual Activity   Alcohol use: No   Drug use: No   Sexual activity: Not on file  Other Topics Concern   Not on file    Social History Narrative   Not on file   Social Determinants of Health   Financial Resource Strain:    Difficulty of Paying Living Expenses: Not on file  Food Insecurity:    Wor[place] the Last Year: Not on file   Ran Out of Food in the Last Year: Not on file  Transportation Needs:    Lack of Transportation (Medical): Not on file   Lack of Transportation (Non-Medical): Not on file  Physical Activity:    Days of Exercise per Week: Not on file   Minutes of Exercise per Session: Not on file  Stress:    Feeling of Stress : Not on file  Social Connections:    Frequency of Communication with Friends and Family: Not on file   Frequency of Social Gatherings with Friends and Family: Not on file   Attends Religious Services: Not on file   Active Member of Clubs or Organizations: Not on file   Attends CArchivistMeetings: Not on file   Marital Status: Not on file      Review of Systems ; has had some mild temperature elevations as well as night sweats, weight loss, dyspnea, occasional cough, back pain, intermittent nausea/ vomiting.  Vital Signs: BP 138/81 (BP Location: Right Arm)    Pulse (!) 135    Temp 99.5 F (37.5 C) (Oral)    Resp 18    SpO2 100%   Physical Exam awake, alert.  Chest clear to auscultation bilaterally.  Heart with tachycardic but regular rhythm.  Abdomen soft, positive bowel sounds, some mild periumbilical tenderness to palpation.  No lower extremity  edema.  Significant/bulky bilateral cervical and supraclavicular adenopathy.  Imaging: CT Soft Tissue Neck W Contrast  Result Date: 05/11/2019 CLINICAL DATA:  Painful left-sided neck mass. EXAM: CT NECK WITH CONTRAST TECHNIQUE: Multidetector CT imaging of the neck was performed using the standard protocol following the bolus administration of intravenous contrast. CONTRAST:  5m OMNIPAQUE IOHEXOL 300 MG/ML  SOLN COMPARISON:  None. FINDINGS: Pharynx and larynx: No  focal mucosal or submucosal lesions are present. Adenoid tissue is within normal limits. Soft palate and tongue base are normal. Vallecular is normal. Hypopharynx is within normal limits. Vocal cords are midline and symmetric. The trachea is normal. Salivary glands: Submandibular and parotid glands and ducts are within normal limits. Thyroid: Normal Lymph nodes: Multiple bulky lymph nodes are present bilaterally. Homogeneous node in the left level 2 station is likely palpable, measuring 3.4 x 2.3 x 3.0 cm. Left posterior level 2 and level 3 lymph node measures up to 3.6 cm. A left level 3 node or nodal mass measures 4.6 x 3.7 x 5.3 cm. Enlarged supraclavicular nodes are present bilaterally, right greater than left. Marked adenopathy extends into the upper mediastinum. The largest right paratracheal node measures 5.2 x 3.5 cm. Left prevascular nodes measure up to 4.3 x 4.1 cm. Vascular: The right internal jugular vein is displaced anteriorly without occlusion. No arterial lesions are present. Limited intracranial: Within normal limits. Visualized orbits: The globes and orbits are within normal limits. Mastoids and visualized paranasal sinuses: The paranasal sinuses and mastoid air cells are clear. Skeleton: Vertebral body Quinn alignment are normal. No focal lytic or blastic lesions are present. Upper chest: The lung apices are clear. IMPRESSION: 1. Extensive bulky adenopathy throughout the neck bilaterally, right greater than left. This is most concerning for a lymphoproliferative disorder such as lymphoma. 2. No primary lesion is evident. 3. The right internal jugular vein is displaced anteriorly without occlusion. These results were called by telephone at the time of interpretation on 05/11/2019 at 3:30 pm to provider [facility] , who verbally acknowledged these results. Electronically Signed   By: CSan MorelleM.D.   On: 05/11/2019 15:33   CT Angio Chest PE W and/or Wo Contrast  Result Date:  05/11/2019 CLINICAL DATA:  New diagnosis of probable lymphoma in the neck. Tachycardia. Rule out pulmonary embolism. Evaluate for adenopathy. EXAM: CT ANGIOGRAPHY CHEST CT ABDOMEN AND PELVIS WITH CONTRAST TECHNIQUE: Multidetector CT imaging of the chest was performed using the standard protocol during bolus administration of intravenous contrast. Multiplanar CT image reconstructions and MIPs were obtained to evaluate the vascular anatomy. Multidetector CT imaging of the abdomen and pelvis was performed using the standard protocol during bolus administration of intravenous contrast. CONTRAST:  1065mOMNIPAQUE IOHEXOL 350 MG/ML SOLN COMPARISON:  None. FINDINGS: CTA CHEST FINDINGS Cardiovascular: The quality of this exam for evaluation of pulmonary embolism is poor to moderate. The bolus is suboptimally timed, with contrast centered in the SVC. No pulmonary embolism to the large lobar level. Normal aortic caliber. Mild cardiomegaly. Small pericardial effusion. Mediastinum/Nodes: Massive low cervical/supraclavicular adenopathy, as on dedicated neck CT. Left axillary adenopathy including at 1.5 cm on 29/5. Confluent mediastinal adenopathy, with a nodal conglomerate in the prevascular space measuring on the order of 8.9 x 6.2 cm on 41/5. Right paratracheal node measures 4.4 cm on image 43/5. Right hilar adenopathy at 2.8 cm on 56/5. Right paravertebral adenopathy versus extramedullary hematopoiesis. Example at 1.9 x 4.2 cm on 120/5. Lungs/Pleura: No pleural fluid. Pleural-based nodules, including the right upper lobe  at 7 mm on 57/6 most likely represent subpleural lymph nodes. No lobar consolidation. Musculoskeletal: Moderate bilateral gynecomastia. No acute osseous abnormality. Review of the MIP images confirms the above findings. CT ABDOMEN and PELVIS FINDINGS Hepatobiliary: Hepatomegaly at 23.8 cm craniocaudal. Mild hepatic steatosis with sparing adjacent the gallbladder. Normal gallbladder, without biliary ductal  dilatation. Pancreas: Normal, without mass or ductal dilatation. Spleen: Splenomegaly, including at 16.7 x 10.7 x 13.2 cm. Heterogeneous splenic density, likely due to lymphomatous involvement. Example vague inferior splenic mass of 4.0 cm on 37/3. Adrenals/Urinary Tract: Normal adrenal glands. Normal kidneys, without hydronephrosis. Normal urinary bladder. Stomach/Bowel: Normal stomach, without wall thickening. Normal colon, appendix, and terminal ileum. Normal small bowel. Vascular/Lymphatic: Normal caliber of the aorta and branch vessels. Abdominal retroperitoneal adenopathy, with a 1.8 cm left periaortic node on 38/3. Porta hepatis node of 1.2 cm on 33/3. No pelvic sidewall adenopathy. Reproductive: Normal prostate. Other: No significant free fluid. Musculoskeletal: No acute osseous abnormality. Review of the MIP images confirms the above findings. IMPRESSION: 1. suboptimal evaluation for pulmonary embolism. No large embolism as detailed above. 2. Adenopathy within the chest and abdomen, consistent with lymphoma. 3. Hepatosplenomegaly with heterogeneous splenic enhancement/attenuation, suspicious for splenic involvement. 4. Paravertebral adenopathy versus extramedullary hematopoiesis. 5. Bilateral gynecomastia. Electronically Signed   By: Abigail Miyamoto M.D.   On: 05/11/2019 17:26   CT ABDOMEN PELVIS W CONTRAST  Result Date: 05/11/2019 CLINICAL DATA:  New diagnosis of probable lymphoma in the neck. Tachycardia. Rule out pulmonary embolism. Evaluate for adenopathy. EXAM: CT ANGIOGRAPHY CHEST CT ABDOMEN AND PELVIS WITH CONTRAST TECHNIQUE: Multidetector CT imaging of the chest was performed using the standard protocol during bolus administration of intravenous contrast. Multiplanar CT image reconstructions and MIPs were obtained to evaluate the vascular anatomy. Multidetector CT imaging of the abdomen and pelvis was performed using the standard protocol during bolus administration of intravenous contrast.  CONTRAST:  132m OMNIPAQUE IOHEXOL 350 MG/ML SOLN COMPARISON:  None. FINDINGS: CTA CHEST FINDINGS Cardiovascular: The quality of this exam for evaluation of pulmonary embolism is poor to moderate. The bolus is suboptimally timed, with contrast centered in the SVC. No pulmonary embolism to the large lobar level. Normal aortic caliber. Mild cardiomegaly. Small pericardial effusion. Mediastinum/Nodes: Massive low cervical/supraclavicular adenopathy, as on dedicated neck CT. Left axillary adenopathy including at 1.5 cm on 29/5. Confluent mediastinal adenopathy, with a nodal conglomerate in the prevascular space measuring on the order of 8.9 x 6.2 cm on 41/5. Right paratracheal node measures 4.4 cm on image 43/5. Right hilar adenopathy at 2.8 cm on 56/5. Right paravertebral adenopathy versus extramedullary hematopoiesis. Example at 1.9 x 4.2 cm on 120/5. Lungs/Pleura: No pleural fluid. Pleural-based nodules, including the right upper lobe at 7 mm on 57/6 most likely represent subpleural lymph nodes. No lobar consolidation. Musculoskeletal: Moderate bilateral gynecomastia. No acute osseous abnormality. Review of the MIP images confirms the above findings. CT ABDOMEN and PELVIS FINDINGS Hepatobiliary: Hepatomegaly at 23.8 cm craniocaudal. Mild hepatic steatosis with sparing adjacent the gallbladder. Normal gallbladder, without biliary ductal dilatation. Pancreas: Normal, without mass or ductal dilatation. Spleen: Splenomegaly, including at 16.7 x 10.7 x 13.2 cm. Heterogeneous splenic density, likely due to lymphomatous involvement. Example vague inferior splenic mass of 4.0 cm on 37/3. Adrenals/Urinary Tract: Normal adrenal glands. Normal kidneys, without hydronephrosis. Normal urinary bladder. Stomach/Bowel: Normal stomach, without wall thickening. Normal colon, appendix, and terminal ileum. Normal small bowel. Vascular/Lymphatic: Normal caliber of the aorta and branch vessels. Abdominal retroperitoneal adenopathy, with a  1.8 cm left periaortic  node on 38/3. Porta hepatis node of 1.2 cm on 33/3. No pelvic sidewall adenopathy. Reproductive: Normal prostate. Other: No significant free fluid. Musculoskeletal: No acute osseous abnormality. Review of the MIP images confirms the above findings. IMPRESSION: 1. suboptimal evaluation for pulmonary embolism. No large embolism as detailed above. 2. Adenopathy within the chest and abdomen, consistent with lymphoma. 3. Hepatosplenomegaly with heterogeneous splenic enhancement/attenuation, suspicious for splenic involvement. 4. Paravertebral adenopathy versus extramedullary hematopoiesis. 5. Bilateral gynecomastia. Electronically Signed   By: Abigail Miyamoto M.D.   On: 05/11/2019 17:26   NM PET Image Initial (PI) Skull Base To Thigh  Result Date: 05/22/2019 CLINICAL DATA:  Initial treatment strategy for lymphadenopathy. Concern for lymphoma. Elevated white blood cell count (28,000) and decreased hemoglobin (7.6) EXAM: NUCLEAR MEDICINE PET SKULL BASE TO THIGH TECHNIQUE: 12.0 mCi F-18 FDG was injected intravenously. Full-ring PET imaging was performed from the skull base to thigh after the radiotracer. CT data was obtained and used for attenuation correction and anatomic localization. Fasting blood glucose: 85 mg/dl COMPARISON:  CT 05/11/2019 FINDINGS: Mediastinal blood pool activity: SUV max 1.2 Liver activity: SUV max NA NECK: Bulky intensely hypermetabolic bilateral cervical adenopathy extends from the level 2 nodal station to the supraclavicular nodes. Example cluster of nodes in the LEFT supraclavicular neck with SUV max equal 23.3. Incidental CT findings: The bulky nodes measure up to 2.4 cm CHEST: Bulky hypermetabolic mediastinal lymph nodes. A prevascular node on the LEFT measures 3 cm with SUV max equal 20.0. Subcarinal nodes with SUV max equal 14.1. Hypermetabolic moderately enlarged LEFT axillary lymph node with SUV max equal 16.8. Two subpleural nodule RIGHT middle lobe subpleural  space (image 76/4) have associated metabolic activity. Pleural base mass in the medial RIGHT lower lobe measuring 4.5 by 3.1 cm with intense metabolic activity (SUV max equal 16.1 Incidental CT findings: none ABDOMEN/PELVIS: The liver is enlarged and hypodense suggesting hepatic steatosis. No focal hypermetabolic activity liver. The spleen has multiple foci of intense metabolic activity with SUV max equal 17.7. Mild spleen enlargement. There are hypermetabolic retroperitoneal periaortic lymph nodes which are relatively small. Example node LEFT aorta with SUV max equal 14.1. Moderately enlarged pelvic lymph nodes. One external iliac lymph node on the LEFT measures 10 mm with SUV max equal 10.1. Incidental CT findings: none SKELETON: There is diffuse hypermetabolic activity throughout the axillary and proximal appendicular skeleton. Activity is moderate. For example of the L5 vertebral body SUV max equal 7.2. Activity in the RIGHT proximal femur SUV max equal 5.7. No focal lesion. The CT portion exam no lesion identified. Incidental CT findings: none IMPRESSION: 1. Bulky hypermetabolic cervical and mediastinal adenopathy consistent with high-grade lymphoma. 2. Evidence of metastasis to the pleural space in the RIGHT lung. 3. Multiple foci of hypermetabolic lymphoma involvement in the spleen. 4. Mildly enlarged hypermetabolic retroperitoneal periaortic adenopathy and iliac adenopathy. 5. Diffuse hypermetabolic activity in the marrow which is moderate in intensity. This may relate to patient's anemic condition and marrow stimulation. Cannot exclude marrow involvement by lymphoma although less favored. Electronically Signed   By: Suzy Bouchard M.D.   On: 05/22/2019 17:22   ECHOCARDIOGRAM COMPLETE  Result Date: 05/25/2019   ECHOCARDIOGRAM REPORT   Patient Name:   KEIMON BASALDUA Date of Exam: 05/25/2019 Medical Rec #:  [id number]           Height:       72.0 in Accession #:    [id number]          Weight:  224.4 lb Date of Birth:  2000-10-11          BSA:          2.24 m Patient Age:    18 years            BP:           127/75 mmHg Patient Gender: M                   HR:           117 bpm. Exam Location:  Outpatient Procedure: 2D Echo, Cardiac Doppler, Color Doppler and Strain Analysis Indications:    Z51.11 Encounter for antineoplastic chemotheraphy  History:        Patient has no prior history of Echocardiogram examinations.  Sonographer:    Jonelle Sidle Dance Referring Phys: [id number] [place]  1. Left ventricular ejection fraction, by visual estimation, is 50%. The left ventricle has low normal function. There is no left ventricular hypertrophy.  2. The average left ventricular global longitudinal strain is -14.1 %.  3. Left ventricular diastolic parameters are indeterminate.  4. Global right ventricle has normal systolic function.The right ventricular size is normal. No increase in right ventricular wall thickness.  5. Left atrial size was normal.  6. Right atrial size was normal.  7. Small pericardial effusion.  8. The pericardial effusion is circumferential.  9. The mitral valve is normal in structure. Trivial mitral valve regurgitation. No evidence of mitral stenosis. 10. The tricuspid valve is normal in structure. Tricuspid valve regurgitation is not demonstrated. 11. The aortic valve is normal in structure. Aortic valve regurgitation is not visualized. No evidence of aortic valve sclerosis or stenosis. 12. The pulmonic valve was normal in structure. Pulmonic valve regurgitation is trivial. 13. TR signal is inadequate for assessing pulmonary artery systolic pressure. 14. The inferior vena cava is normal in size with greater than 50% respiratory variability, suggesting right atrial pressure of 3 mmHg. 15. There is borderline dilatation of the aortic root measuring 39 mm. FINDINGS  Left Ventricle: Left ventricular ejection fraction, by visual estimation, is 50%. The left ventricle has low  normal function. The average left ventricular global longitudinal strain is -14.1 %. The left ventricle is not well visualized. There is no left  ventricular hypertrophy. Left ventricular diastolic parameters are indeterminate. Normal left atrial pressure. Right Ventricle: The right ventricular size is normal. No increase in right ventricular wall thickness. Global RV systolic function is has normal systolic function. Left Atrium: Left atrial size was normal in size. Right Atrium: Right atrial size was normal in size Pericardium: A small pericardial effusion is present. The pericardial effusion is circumferential. There is no evidence of pericardial constriction. There is no evidence of cardiac tamponade. Mitral Valve: The mitral valve is normal in structure. Trivial mitral valve regurgitation. No evidence of mitral valve stenosis by observation. Tricuspid Valve: The tricuspid valve is normal in structure. Tricuspid valve regurgitation is not demonstrated. Aortic Valve: The aortic valve is normal in structure. Aortic valve regurgitation is not visualized. The aortic valve is structurally normal, with no evidence of sclerosis or stenosis. Pulmonic Valve: The pulmonic valve was normal in structure. Pulmonic valve regurgitation is trivial. Pulmonic regurgitation is trivial. Aorta: Aortic dilatation noted. There is borderline dilatation of the aortic root measuring 39 mm. Venous: The inferior vena cava is normal in size with greater than 50% respiratory variability, suggesting right atrial pressure of 3 mmHg. IAS/Shunts: No atrial level shunt detected by color  flow Doppler. There is no evidence of a patent foramen ovale. No ventricular septal defect is seen or detected. There is no evidence of an atrial septal defect.  LEFT VENTRICLE PLAX 2D LVIDd:         3.90 cm  Diastology LVIDs:         2.40 cm  LV e' lateral:   14.20 cm/s LV PW:         1.20 cm  LV E/e' lateral: 6.0 LV IVS:        0.90 cm  LV e' medial:    13.20  cm/s LVOT diam:     2.20 cm  LV E/e' medial:  6.5 LV SV:         46 ml LV SV Index:   19.90    2D Longitudinal Strain LVOT Area:     3.80 cm 2D Strain GLS Avg:     -14.1 %  RIGHT VENTRICLE             IVC RV Basal diam:  2.50 cm     IVC diam: 1.95 cm RV S prime:     23.90 cm/s TAPSE (M-mode): 2.6 cm LEFT ATRIUM             Index       RIGHT ATRIUM           Index LA diam:        3.30 cm 1.47 cm/m  RA Area:     10.50 cm LA Vol (A2C):   43.6 ml 19.49 ml/m RA Volume:   17.00 ml  7.60 ml/m LA Vol (A4C):   56.8 ml 25.39 ml/m LA Biplane Vol: 50.8 ml 22.71 ml/m  AORTIC VALVE LVOT Vmax:   113.00 cm/s LVOT Vmean:  67.600 cm/s LVOT VTI:    0.151 m  AORTA Ao Root diam: 3.90 cm Ao Asc diam:  2.60 cm MITRAL VALVE MV Area (PHT): 4.36 cm            SHUNTS MV PHT:        50.46 msec          Systemic VTI:  0.15 m MV Decel Time: 174 msec            Systemic Diam: 2.20 cm MV E velocity: 85.55 cm/s 103 cm/s  Cherlynn Kaiser MD Electronically signed by Cherlynn Kaiser MD Signature Date/Time: 05/25/2019/3:35:18 PM    Final     Labs:  CBC: Recent Labs    05/11/19 1320 05/18/19 1044  WBC 25.5* 28.5*  HGB 8.3* 7.6*  HCT 27.5* 25.6*  PLT 478* 466*    COAGS: No results for input(s): INR, APTT in the last 8760 hours.  BMP: Recent Labs    05/11/19 1400 05/18/19 1044  NA 132* 134*  K 3.9 4.5  CL 95* 95*  CO2 25 28  GLUCOSE 94 95  BUN <5* 7  CALCIUM 8.8* 9.3  CREATININE 0.62 0.71  GFRNONAA >60 >60  GFRAA >60 >60    LIVER FUNCTION TESTS: Recent Labs    05/11/19 1400 05/18/19 1044  BILITOT 0.4 0.5  AST 30 47*  ALT 42 49*  ALKPHOS 216* 248*  PROT 8.8* 9.1*  ALBUMIN 2.3* 2.4*    TUMOR MARKERS: No results for input(s): AFPTM, CEA, CA199, CHROMGRNA in the last 8760 hours.  Assessment and Plan: 18 y.o. male smoker with past medical history significant for depression, suicidal ideation in 2016, oppositional defiant disorder, obesity, and enlarging left neck masses/lymphadenopathy suspicious for  lymphoma.  Subsequent imaging has revealed bulky hypermetabolic, cervical and mediastinal adenopathy with metastases to the pleural space in the right lung, mildly enlarged hypermetabolic retroperitoneal and periaortic adenopathy as well as iliac adenopathy, diffuse hypermetabolic activity in the marrow and multiple foci of hypermetabolic activity in spleen worrisome for high-grade lymphoma.  He presents today for CT-guided bone marrow biopsy as well as right supraclavicular lymph node biopsy for further evaluation.Risks and benefits of procedure was discussed with the patient  including, but not limited to bleeding, infection, damage to adjacent structures or low yield requiring additional tests.  All of the questions were answered and there is agreement to proceed.  Consent signed and in chart.  LABS PENDING   Thank you for this interesting consult.  I greatly enjoyed meeting Terry Quinn and look forward to participating in their care.  A copy of this report was sent to the requesting provider on this date.  Electronically Signed: D. Rowe Robert, PA-C 05/27/2019, 10:08 AM   I spent a total of  25 minutes   in face to face in clinical consultation, greater than 50% of which was counseling/coordinating care for CT-guided bone marrow biopsy and image guided right supraclavicular lymph node biopsy

## 2019-05-27 NOTE — Procedures (Signed)
Interventional Radiology Procedure:   Indications: Lymphadenopathy  Procedure: CT guided bone marrow biopsy and US guided core biopsy of left supraclavicular lymph node  Findings: 2 aspirates and 1 core from right ilium.  6 cores from left neck lymph node  Complications: None     EBL: Minimal, less than 10 ml  Plan: Discharge to home in one hour.   Adam R. Henn, MD  Pager: [phone]   

## 2019-05-28 ENCOUNTER — Other Ambulatory Visit: Payer: Self-pay

## 2019-06-01 ENCOUNTER — Telehealth: Payer: Self-pay | Admitting: *Deleted

## 2019-06-01 NOTE — Telephone Encounter (Signed)
[place] Work  Clinical Social Work was referred by medical oncology PA for assessment of psychosocial needs.  Clinical Social Worker contacted patient by phone  to offer support and assess for needs.  Terry Quinn reported feeling somewhat overwhelmed awaiting results of possible diagnosis, referenced many others in his situation must always feel this.  CSW validated patients feelings and discussed resources for coping with health changes.  Terry Quinn identified his mother as his main support, whom he lives with. Patient is interested in applying for Medicaid and plans to follow up with financial resource specialist once diagnosis/treatment is confirmed for financial assistance.  CSW strongly encouraged patient to follow up with CSW as needs arise and for supportive counseling.   Gwinda Maine, LCSW  Clinical Social Worker [facility]

## 2019-06-02 ENCOUNTER — Inpatient Hospital Stay (HOSPITAL_BASED_OUTPATIENT_CLINIC_OR_DEPARTMENT_OTHER): Payer: Medicaid Other | Admitting: Medical

## 2019-06-02 ENCOUNTER — Other Ambulatory Visit: Payer: Self-pay

## 2019-06-02 ENCOUNTER — Inpatient Hospital Stay: Payer: Medicaid Other

## 2019-06-02 ENCOUNTER — Telehealth: Payer: Self-pay | Admitting: Medical Oncology

## 2019-06-02 ENCOUNTER — Other Ambulatory Visit: Payer: Self-pay | Admitting: Medical Oncology

## 2019-06-02 ENCOUNTER — Encounter: Payer: Self-pay | Admitting: Internal Medicine

## 2019-06-02 ENCOUNTER — Inpatient Hospital Stay (HOSPITAL_BASED_OUTPATIENT_CLINIC_OR_DEPARTMENT_OTHER): Payer: Medicaid Other | Admitting: Internal Medicine

## 2019-06-02 ENCOUNTER — Telehealth: Payer: Self-pay | Admitting: Physician Assistant

## 2019-06-02 ENCOUNTER — Telehealth: Payer: Self-pay | Admitting: *Deleted

## 2019-06-02 ENCOUNTER — Ambulatory Visit: Payer: Self-pay

## 2019-06-02 ENCOUNTER — Other Ambulatory Visit: Payer: Self-pay | Admitting: Medical

## 2019-06-02 VITALS — BP 135/76 | HR 122 | Temp 97.8°F | Resp 20 | Ht 72.0 in | Wt 213.9 lb

## 2019-06-02 DIAGNOSIS — C8111 Nodular sclerosis classical Hodgkin lymphoma, lymph nodes of head, face, and neck: Secondary | ICD-10-CM | POA: Insufficient documentation

## 2019-06-02 DIAGNOSIS — C8110 Nodular sclerosis classical Hodgkin lymphoma, unspecified site: Secondary | ICD-10-CM | POA: Diagnosis not present

## 2019-06-02 DIAGNOSIS — D649 Anemia, unspecified: Secondary | ICD-10-CM

## 2019-06-02 DIAGNOSIS — R591 Generalized enlarged lymph nodes: Secondary | ICD-10-CM

## 2019-06-02 DIAGNOSIS — Z5111 Encounter for antineoplastic chemotherapy: Secondary | ICD-10-CM | POA: Insufficient documentation

## 2019-06-02 DIAGNOSIS — Z7189 Other specified counseling: Secondary | ICD-10-CM | POA: Diagnosis not present

## 2019-06-02 LAB — CBC WITH DIFFERENTIAL (CANCER CENTER ONLY)
Abs Immature Granulocytes: 2.09 10*3/uL — ABNORMAL HIGH (ref 0.00–0.07)
Basophils Absolute: 0.1 10*3/uL (ref 0.0–0.1)
Basophils Relative: 0 %
Eosinophils Absolute: 0 10*3/uL (ref 0.0–0.5)
Eosinophils Relative: 0 %
HCT: 25.2 % — ABNORMAL LOW (ref 39.0–52.0)
Hemoglobin: 7.6 g/dL — ABNORMAL LOW (ref 13.0–17.0)
Immature Granulocytes: 6 %
Lymphocytes Relative: 3 %
Lymphs Abs: 1.1 10*3/uL (ref 0.7–4.0)
MCH: 22.8 pg — ABNORMAL LOW (ref 26.0–34.0)
MCHC: 30.2 g/dL (ref 30.0–36.0)
MCV: 75.7 fL — ABNORMAL LOW (ref 80.0–100.0)
Monocytes Absolute: 1.6 10*3/uL — ABNORMAL HIGH (ref 0.1–1.0)
Monocytes Relative: 5 %
Neutro Abs: 28 10*3/uL — ABNORMAL HIGH (ref 1.7–7.7)
Neutrophils Relative %: 86 %
Platelet Count: 449 10*3/uL — ABNORMAL HIGH (ref 150–400)
RBC: 3.33 MIL/uL — ABNORMAL LOW (ref 4.22–5.81)
RDW: 19.8 % — ABNORMAL HIGH (ref 11.5–15.5)
WBC Count: 32.7 10*3/uL — ABNORMAL HIGH (ref 4.0–10.5)
nRBC: 0 % (ref 0.0–0.2)

## 2019-06-02 LAB — CMP (CANCER CENTER ONLY)
ALT: 57 U/L — ABNORMAL HIGH (ref 0–44)
AST: 38 U/L (ref 15–41)
Albumin: 2.4 g/dL — ABNORMAL LOW (ref 3.5–5.0)
Alkaline Phosphatase: 303 U/L — ABNORMAL HIGH (ref 38–126)
Anion gap: 16 — ABNORMAL HIGH (ref 5–15)
BUN: 6 mg/dL (ref 6–20)
CO2: 24 mmol/L (ref 22–32)
Calcium: 9.2 mg/dL (ref 8.9–10.3)
Chloride: 95 mmol/L — ABNORMAL LOW (ref 98–111)
Creatinine: 0.73 mg/dL (ref 0.61–1.24)
GFR, Est AFR Am: >60 mL/min (ref >60)
GFR, Estimated: >60 mL/min (ref >60)
Glucose, Bld: 103 mg/dL — ABNORMAL HIGH (ref 70–99)
Potassium: 3.8 mmol/L (ref 3.5–5.1)
Sodium: 135 mmol/L (ref 135–145)
Total Bilirubin: 0.7 mg/dL (ref 0.3–1.2)
Total Protein: 9.8 g/dL — ABNORMAL HIGH (ref 6.5–8.1)

## 2019-06-02 LAB — SAMPLE TO BLOOD BANK

## 2019-06-02 LAB — PREPARE RBC (CROSSMATCH)

## 2019-06-02 LAB — SURGICAL PATHOLOGY

## 2019-06-02 LAB — ABO/RH: ABO/RH(D): O POS

## 2019-06-02 LAB — SARS CORONAVIRUS 2 BY RT PCR (HOSPITAL ORDER, PERFORMED IN ~~LOC~~ HOSPITAL LAB): SARS Coronavirus 2: NEGATIVE

## 2019-06-02 MED ORDER — ACETAMINOPHEN 325 MG PO TABS
ORAL_TABLET | ORAL | Status: AC
  Filled 2019-06-02: qty 2

## 2019-06-02 MED ORDER — ACETAMINOPHEN 325 MG PO TABS
650.0000 mg | ORAL_TABLET | Freq: Once | ORAL | Status: AC
  Administered 2019-06-02: 650 mg via ORAL

## 2019-06-02 MED ORDER — DIPHENHYDRAMINE HCL 25 MG PO CAPS
25.0000 mg | ORAL_CAPSULE | Freq: Once | ORAL | Status: AC
  Administered 2019-06-02: 25 mg via ORAL

## 2019-06-02 MED ORDER — LIDOCAINE-PRILOCAINE 2.5-2.5 % EX CREA: 1.0000 "application " | TOPICAL_CREAM | CUTANEOUS | 1 refills | Status: DC | PRN

## 2019-06-02 MED ORDER — SODIUM CHLORIDE 0.9% IV SOLUTION
250.0000 mL | Freq: Once | INTRAVENOUS | Status: AC
  Administered 2019-06-02: 250 mL via INTRAVENOUS
  Filled 2019-06-02: qty 250

## 2019-06-02 MED ORDER — ALLOPURINOL 100 MG PO TABS: 100.0000 mg | ORAL_TABLET | Freq: Two times a day (BID) | ORAL | 2 refills | Status: DC

## 2019-06-02 MED ORDER — DIPHENHYDRAMINE HCL 25 MG PO CAPS
ORAL_CAPSULE | ORAL | Status: AC
  Filled 2019-06-02: qty 1

## 2019-06-02 MED ORDER — PROCHLORPERAZINE MALEATE 10 MG PO TABS: 10.0000 mg | ORAL_TABLET | Freq: Four times a day (QID) | ORAL | 0 refills | Status: DC | PRN

## 2019-06-02 NOTE — Patient Instructions (Signed)
Blood Transfusion, Adult, Care After This sheet gives you information about how to care for yourself after your procedure. Your doctor may also give you more specific instructions. If you have problems or questions, contact your doctor. Follow these instructions at home:   Take over-the-counter and prescription medicines only as told by your doctor.  Go back to your normal activities as told by your doctor.  Follow instructions from your doctor about how to take care of the area where an IV tube was put into your vein (insertion site). Make sure you: ? Wash your hands with soap and water before you change your bandage (dressing). If there is no soap and water, use hand sanitizer. ? Change your bandage as told by your doctor.  Check your IV insertion site every day for signs of infection. Check for: ? More redness, swelling, or pain. ? More fluid or blood. ? Warmth. ? Pus or a bad smell. Contact a doctor if:  You have more redness, swelling, or pain around the IV insertion site.  You have more fluid or blood coming from the IV insertion site.  Your IV insertion site feels warm to the touch.  You have pus or a bad smell coming from the IV insertion site.  Your pee (urine) turns pink, red, or brown.  You feel weak after doing your normal activities. Get help right away if:  You have signs of a serious allergic or body defense (immune) system reaction, including: ? Itchiness. ? Hives. ? Trouble breathing. ? Anxiety. ? Pain in your chest or lower back. ? Fever, flushing, and chills. ? Fast pulse. ? Rash. ? Watery poop (diarrhea). ? Throwing up (vomiting). ? Dark pee. ? Serious headache. ? Dizziness. ? Stiff neck. ? Yellow color in your face or the white parts of your eyes (jaundice). Summary  After a blood transfusion, return to your normal activities as told by your doctor.  Every day, check for signs of infection where the IV tube was put into your vein.  Some  signs of infection are warm skin, more redness and pain, more fluid or blood, and pus or a bad smell where the needle went in.  Contact your doctor if you feel weak or have any unusual symptoms. This information is not intended to replace advice given to you by your health care provider. Make sure you discuss any questions you have with your health care provider. Document Released: 06/11/2014 Document Revised: 09/25/2017 Document Reviewed: 01/13/2016 Elsevier Patient Education  2020 Elsevier Inc.  Coronavirus (COVID-19) Are you at risk?  Are you at risk for the Coronavirus (COVID-19)?  To be considered HIGH RISK for Coronavirus (COVID-19), you have to meet the following criteria:  . Traveled to [place], [place], [place], [place] or [place]; or in the [place] to [place], [place], [place], or [place]; and have fever, cough, and shortness of breath within the last 2 weeks of travel OR . Been in close contact with a person diagnosed with COVID-19 within the last 2 weeks and have fever, cough, and shortness of breath . IF YOU DO NOT MEET THESE CRITERIA, YOU ARE CONSIDERED LOW RISK FOR COVID-19.  What to do if you are HIGH RISK for COVID-19?  . If you are having a medical emergency, call 911. . Seek medical care right away. Before you go to a doctor's office, urgent care or emergency department, call ahead and tell them about your recent travel, contact with someone diagnosed with COVID-19, and   your symptoms. You should receive instructions from your physician's office regarding next steps of care.  . When you arrive at healthcare provider, tell the healthcare staff immediately you have returned from visiting [place], [place], [place], [place] or [place]; or traveled in the [place] to [place], [place], [place], or [place]; in the last two weeks or you have been in close contact with a person diagnosed with COVID-19 in the last 2 weeks.   . Tell the health care staff about  your symptoms: fever, cough and shortness of breath. . After you have been seen by a medical provider, you will be either: o Tested for (COVID-19) and discharged home on quarantine except to seek medical care if symptoms worsen, and asked to  - Stay home and avoid contact with others until you get your results (4-5 days)  - Avoid travel on public transportation if possible (such as bus, train, or airplane) or o Sent to the Emergency Department by EMS for evaluation, COVID-19 testing, and possible admission depending on your condition and test results.  What to do if you are LOW RISK for COVID-19?  Reduce your risk of any infection by using the same precautions used for avoiding the common cold or flu:  . Wash your hands often with soap and warm water for at least 20 seconds.  If soap and water are not readily available, use an alcohol-based hand sanitizer with at least 60% alcohol.  . If coughing or sneezing, cover your mouth and nose by coughing or sneezing into the elbow areas of your shirt or coat, into a tissue or into your sleeve (not your hands). . Avoid shaking hands with others and consider head nods or verbal greetings only. . Avoid touching your eyes, nose, or mouth with unwashed hands.  . Avoid close contact with people who are sick. . Avoid places or events with large numbers of people in one location, like concerts or sporting events. . Carefully consider travel plans you have or are making. . If you are planning any travel outside or inside the [place], visit the CDC's Travelers' Health webpage for the latest health notices. . If you have some symptoms but not all symptoms, continue to monitor at home and seek medical attention if your symptoms worsen. . If you are having a medical emergency, call 911.   ADDITIONAL HEALTHCARE OPTIONS FOR PATIENTS  [facility] / e-Visit: [email or web address]         [facility]:  [phone]  [facility]: [phone]                   [facility]: [phone]   

## 2019-06-02 NOTE — Progress Notes (Signed)
START ON PATHWAY REGIMEN - Lymphoma and CLL     A cycle is every 28 days:     Doxorubicin      Dacarbazine      Vinblastine      Bleomycin   **Always confirm dose/schedule in your pharmacy ordering system**  Patient Characteristics: Classical Hodgkin Lymphoma, First Line, Stage III / IV, Age < 82 Disease Type: Not Applicable Disease Type: Not Applicable Disease Type: Classical Hodgkin Lymphoma Line of therapy: First Line [place] Stage: IIIB Age: < 60 Intent of Therapy: Curative Intent, Discussed with Patient

## 2019-06-02 NOTE — Progress Notes (Signed)
CRITICAL VALUE STICKER  CRITICAL VALUE: Hgb 7.6  RECEIVER (on-site recipient of call): Shelle Iron, RN  DATE & TIME NOTIFIED: 06/02/2019 @11 :40 am  MESSENGER (representative from lab):  MD NOTIFIED: Dr.Mohamed  TIME OF NOTIFICATION: 06/02/2019 @ 11:23 am   RESPONSE: He is seeing patient today.  Made him aware that sample to blood bank was sent and patient does not have any transfusion appointments scheduled as of yet.

## 2019-06-02 NOTE — Progress Notes (Signed)
This patient was seen today for collection of COVID-19 testing.  A nasopharyngeal swab was collected and was submitted to the lab.  This returned negative.  Sandi Mealy, MHS, PA-C Physician Assistant

## 2019-06-02 NOTE — Telephone Encounter (Signed)
Portacath- LVM for IR to return call to see if pt can get Port same day as PFT at [facility].

## 2019-06-02 NOTE — Telephone Encounter (Signed)
Called the patient to inform him that his pre-procedure COVID test was negative and he can plan to proceed with his PFTs tomorrow as scheduled. He expressed understanding.

## 2019-06-02 NOTE — Progress Notes (Signed)
These preliminary result these preliminary results were noted.  Awaiting final report.

## 2019-06-02 NOTE — Patient Instructions (Addendum)
Hodgkin Lymphoma, Adult  Hodgkin lymphoma, also called Hodgkin disease, is a cancer that affects the lymphatic system. The lymphatic system is part of the body's defense system (immune system), which protects the body from infections, germs, and diseases. Hodgkin lymphoma often affects white blood cells and the lymph nodes. Hodgkin lymphoma can spread from lymph node to lymph node and to areas of the body where there is lymph tissue, including to the center of the bones (bone marrow). Advanced Hodgkin lymphoma can spread into blood vessels and be carried almost anywhere in the body. There are two types of Hodgkin lymphoma:  Classical Hodgkin lymphoma.  Nodular lymphocyte-predominant Hodgkin lymphoma (NLPHL). What are the causes? The cause is not known. What increases the risk? You may be more likely to develop Hodgkin lymphoma if:  You are male.  You have been infected with the virus that causes mononucleosis (Epstein-Barr virus).  You have a brother or sister with Hodgkin lymphoma.  You have a weakened immune system.  You have HIV.  You have a personal or family history of autoimmune conditions such as rheumatoid arthritis, systemic lupus erythematosus (SLE), or sarcoidosis. What are the signs or symptoms? The first sign of Hodgkin lymphoma is often a painless swelling in a lymph node. The swelling may be felt in the neck, under the arm, or in the groin. Other symptoms include:  Fever.  Drenching night sweats.  Feeling tired all the time.  Cough.  Shortness of breath.  Itchy skin.  Loss of appetite.  Weight loss.  Pain in the lymph nodes after drinking alcohol. How is this diagnosed? This condition may be diagnosed based on your medical history and symptoms, a physical exam, and a procedure in which a tissue sample is removed from a lymph node and then examined under a microscope (biopsy). You may also have other tests to find out how advanced the cancer is and  whether it has spread. This is called staging. These tests may include:  Blood tests.  Imaging tests, such as: ? A chest X-ray. ? A CT scan. ? An MRI. ? A PET scan.  A bone marrow biopsy to see if the disease has spread to the bone marrow. How is this treated? Your treatment will depend on the type of Hodgkin lymphoma you have, the stage of your cancer, your age, and your overall health. Treatment usually starts with one of the following:  Chemotherapy. Chemotherapy is the use of medicines to stop or slow the growth of cancer cells.  Radiation therapy. Radiation therapy is the use of high-energy X-rays to kill cancer cells.  A combination of chemotherapy and radiation therapy. If chemotherapy and radiation therapy are not completely successful, you may have treatment with:  Targeted therapy. This treatment targets specific parts of cancer cells and the area around them to block the growth and spread of cancer.  Very high doses of chemotherapy followed by a stem cell transplant. Stem cells are cells that help your body develop new healthy blood cells after chemotherapy. Follow these instructions at home:  Eating and drinking  Do not take dietary supplements or herbal medicines unless your health care provider tells you to take them. Some supplements can interfere with how well the treatment works.  Try to eat regular, healthy meals. Some of your treatments might affect your appetite. Lifestyle   Make sure you are getting enough sleep on a regular basis. Most adults need 6-8 hours of sleep each night. During treatment, you may need  more sleep.  Consider joining a cancer support group. Ask your health care provider for more information about local and online support groups.  Do not use any products that contain nicotine or tobacco, such as cigarettes, e-cigarettes, and chewing tobacco. If you need help quitting, ask your health care provider. General instructions  Take  over-the-counter and prescription medicines only as directed by your health care provider.  Keep all follow-up visits as told by your health care provider. This is important during and after treatment.  Cancer treatment may increase your risk for other cancers and infections. After treatment: ? Make sure you get all vaccinations as recommended by your health care provider. ? Have regular cancer screenings as recommended by your health care provider. Where to find more information  American Cancer Society (ACS): www.cancer.org  Leukemia and Lymphoma Society (LLS): PreviewPal.pl  National Cancer Institute ([place]): www.cancer.gov Contact a health care provider if:  You have a fever or other signs of infection.  You develop new symptoms or your old symptoms come back. Get help right away if:  You have chest pain.  You have trouble breathing. Summary  Hodgkin lymphoma, also called Hodgkin disease, is a cancer that affects the lymphatic system.  The first sign of Hodgkin lymphoma is often a painless swelling in a lymph node. The swelling may be felt in the neck, under the arm, or in the groin.  Your treatment will depend on the type of cancer cells you have, the stage of your cancer, your age, and your overall health. Treatment may include a combination of chemotherapy and radiation therapy.  Keep all follow-up visits as told by your health care provider. This is important during and after treatment. This information is not intended to replace advice given to you by your health care provider. Make sure you discuss any questions you have with your health care provider. Document Released: 08/28/2007 Document Revised: 03/06/2018 Document Reviewed: 03/06/2018 Elsevier Patient Education  2020 [place] job on quitting smoking .Here are some tips if you need them.   Steps to Quit Smoking Smoking tobacco is the leading cause of preventable death. It can affect almost every  organ in the body. Smoking puts you and people around you at risk for many serious, long-lasting (chronic) diseases. Quitting smoking can be hard, but it is one of the best things that you can do for your health. It is never too late to quit. How do I get ready to quit? When you decide to quit smoking, make a plan to help you succeed. Before you quit:  Pick a date to quit. Set a date within the next 2 weeks to give you time to prepare.  Write down the reasons why you are quitting. Keep this list in places where you will see it often.  Tell your family, friends, and co-workers that you are quitting. Their support is important.  Talk with your doctor about the choices that may help you quit.  Find out if your health insurance will pay for these treatments.  Know the people, places, things, and activities that make you want to smoke (triggers). Avoid them. What first steps can I take to quit smoking?  Throw away all cigarettes at home, at work, and in your car.  Throw away the things that you use when you smoke, such as ashtrays and lighters.  Clean your car. Make sure to empty the ashtray.  Clean your home, including curtains and carpets. What can  I do to help me quit smoking? Talk with your doctor about taking medicines and seeing a counselor at the same time. You are more likely to succeed when you do both.  If you are pregnant or breastfeeding, talk with your doctor about counseling or other ways to quit smoking. Do not take medicine to help you quit smoking unless your doctor tells you to do so. To quit smoking: Quit right away  Quit smoking totally, instead of slowly cutting back on how much you smoke over a period of time.  Go to counseling. You are more likely to quit if you go to counseling sessions regularly. Take medicine You may take medicines to help you quit. Some medicines need a prescription, and some you can buy over-the-counter. Some medicines may contain a drug  called nicotine to replace the nicotine in cigarettes. Medicines may:  Help you to stop having the desire to smoke (cravings).  Help to stop the problems that come when you stop smoking (withdrawal symptoms). Your doctor may ask you to use:  Nicotine patches, gum, or lozenges.  Nicotine inhalers or sprays.  Non-nicotine medicine that is taken by mouth. Find resources Find resources and other ways to help you quit smoking and remain smoke-free after you quit. These resources are most helpful when you use them often. They include:  Online chats with a Social worker.  Phone quitlines.  Printed Furniture conservator/restorer.  Support groups or group counseling.  Text messaging programs.  Mobile phone apps. Use apps on your mobile phone or tablet that can help you stick to your quit plan. There are many free apps for mobile phones and tablets as well as websites. Examples include Quit Guide from the [employer] and smokefree.gov  What things can I do to make it easier to quit?   Talk to your family and friends. Ask them to support and encourage you.  Call a phone quitline (1-800-QUIT-NOW), reach out to support groups, or work with a Social worker.  Ask people who smoke to not smoke around you.  Avoid places that make you want to smoke, such as: ? Bars. ? Parties. ? Smoke-break areas at work.  Spend time with people who do not smoke.  Lower the stress in your life. Stress can make you want to smoke. Try these things to help your stress: ? Getting regular exercise. ? Doing deep-breathing exercises. ? Doing yoga. ? Meditating. ? Doing a body scan. To do this, close your eyes, focus on one area of your body at a time from head to toe. Notice which parts of your body are tense. Try to relax the muscles in those areas. How will I feel when I quit smoking? Day 1 to 3 weeks Within the first 24 hours, you may start to have some problems that come from quitting tobacco. These problems are very bad 2-3 days  after you quit, but they do not often last for more than 2-3 weeks. You may get these symptoms:  Mood swings.  Feeling restless, nervous, angry, or annoyed.  Trouble concentrating.  Dizziness.  Strong desire for high-sugar foods and nicotine.  Weight gain.  Trouble pooping (constipation).  Feeling like you may vomit (nausea).  Coughing or a sore throat.  Changes in how the medicines that you take for other issues work in your body.  Depression.  Trouble sleeping (insomnia). Week 3 and afterward After the first 2-3 weeks of quitting, you may start to notice more positive results, such as:  Better sense of  smell and taste.  Less coughing and sore throat.  Slower heart rate.  Lower blood pressure.  Clearer skin.  Better breathing.  Fewer sick days. Quitting smoking can be hard. Do not give up if you fail the first time. Some people need to try a few times before they succeed. Do your best to stick to your quit plan, and talk with your doctor if you have any questions or concerns. Summary  Smoking tobacco is the leading cause of preventable death. Quitting smoking can be hard, but it is one of the best things that you can do for your health.  When you decide to quit smoking, make a plan to help you succeed.  Quit smoking right away, not slowly over a period of time.  When you start quitting, seek help from your doctor, family, or friends. This information is not intended to replace advice given to you by your health care provider. Make sure you discuss any questions you have with your health care provider. Document Released: 03/17/2009 Document Revised: 08/08/2018 Document Reviewed: 08/09/2018 Elsevier Patient Education  2020 [employer].

## 2019-06-02 NOTE — Progress Notes (Signed)
FYI, Terry Quinn

## 2019-06-02 NOTE — Progress Notes (Signed)
[place] [phone]   [phone]  OFFICE PROGRESS NOTE  Patient, No Pcp Per No address on file  DIAGNOSIS: Classical Hodgkin Lymphoma, Nodular Sclerosis subtype. He presented with bulky bilateral cervical lymphadenopathy, mediastinal lymphadenopathy, and mildly enlarged retroperitoneal periaortic adenopathy and iliac adenopathy. He also presented with involvement of the spleen. He was diagnosed in December 2020.   PRIOR THERAPY: None   CURRENT THERAPY: Systemic chemotherapy with doxorubicin, dacarbazine, vinblastine, and bleomycin on days 1 and 15 every 4 weeks.  First dose expected on January 5th, 2021.   INTERVAL HISTORY: Terry Quinn 18 y.o. male returns to the clinic today for evaluation. Today, is reporting fatigue, decreased exercise tolerance, and night sweats. He also has unintentionally weight loss an additional 11 lb over the course of the last two weeks. He denies any fevers or chills. He denies any recent infections. He denies any chest pain, cough, or hemoptysis but reports shortness of breath with exertion. He denies any nausea, vomiting, diarrhea, or constipation. He denies any bleeding or bruising including epistaxis, gingival bleeding, hematuria, melena, or hematochezia. He is scheduled for his pulmonary function test tomorrow, although he expresses concern with being unable to have his drive-thru pre-procedure covid test due to not owning a car. He recently had several studies performed including a PET scan, lymph node biopsy, bone marrow biopsy and aspirate, and echocardiogram. He is here today for evaluation and to discuss his current condition and treatment options.    MEDICAL HISTORY:History reviewed. No pertinent past medical history.  ALLERGIES:  has No Known Allergies.  MEDICATIONS:  Current Outpatient Medications  Medication Sig Dispense Refill  . ondansetron (ZOFRAN ODT) 4 MG disintegrating tablet 17m ODT q4 hours  prn nausea/vomit (Patient not taking: Reported on 05/18/2019) 10 tablet 0   No current facility-administered medications for this visit.    SURGICAL HISTORY:  Past Surgical History:  Procedure Laterality Date  . IR [place] BX ASP/DRAIN  05/27/2019    REVIEW OF SYSTEMS:  Constitutional: positive for fatigue and weight loss Eyes: negative Ears, nose, mouth, throat, and face: negative Respiratory: positive for dyspnea on exertion Cardiovascular: negative Gastrointestinal: negative Genitourinary:negative Integument/breast: negative Hematologic/lymphatic: positive for lymphadenopathy Musculoskeletal:negative Neurological: negative Behavioral/Psych: negative Endocrine: negative Allergic/Immunologic: negative   PHYSICAL EXAMINATION: General appearance: alert, cooperative, fatigued and no distress Head: Normocephalic, without obvious abnormality, atraumatic Neck: marked anterior cervical adenopathy, no carotid bruit, no JVD, supple, symmetrical, trachea midline and thyroid not enlarged, symmetric, no tenderness/mass/nodules Lymph nodes: Cervical adenopathy: Large bilateral cervical and supraclavicular lymphadenopathy Resp: clear to auscultation bilaterally Back: symmetric, no curvature. ROM normal. No CVA tenderness. Cardio: regular rate and rhythm, S1, S2 normal, no murmur, click, rub or gallop GI: soft, non-tender; bowel sounds normal; no masses,  no organomegaly Extremities: extremities normal, atraumatic, no cyanosis or edema Neurologic: Alert and oriented X 3, normal strength and tone. Normal symmetric reflexes. Normal coordination and gait  ECOG PERFORMANCE STATUS: 1 - Symptomatic but completely ambulatory  Blood pressure 135/76, pulse (!) 122, temperature 97.8 F (36.6 C), temperature source Oral, resp. rate 20, height 6' (1.829 m), weight 213 lb 14.4 oz (97 kg), SpO2 100 %.  LABORATORY DATA: Lab Results  Component Value Date   WBC 32.7 (H) 06/02/2019   HGB 7.6 (L)  06/02/2019   HCT 25.2 (L) 06/02/2019   MCV 75.7 (L) 06/02/2019   PLT 449 (H) 06/02/2019      Chemistry      Component Value Date/Time  NA 135 06/02/2019 1051   K 3.8 06/02/2019 1051   CL 95 (L) 06/02/2019 1051   CO2 24 06/02/2019 1051   BUN 6 06/02/2019 1051   CREATININE 0.73 06/02/2019 1051      Component Value Date/Time   CALCIUM 9.2 06/02/2019 1051   ALKPHOS 303 (H) 06/02/2019 1051   AST 38 06/02/2019 1051   ALT 57 (H) 06/02/2019 1051   BILITOT 0.7 06/02/2019 1051       RADIOGRAPHIC STUDIES: CT Soft Tissue Neck W Contrast  Result Date: 05/11/2019 CLINICAL DATA:  Painful left-sided neck mass. EXAM: CT NECK WITH CONTRAST TECHNIQUE: Multidetector CT imaging of the neck was performed using the standard protocol following the bolus administration of intravenous contrast. CONTRAST:  33m OMNIPAQUE IOHEXOL 300 MG/ML  SOLN COMPARISON:  None. FINDINGS: Pharynx and larynx: No focal mucosal or submucosal lesions are present. Adenoid tissue is within normal limits. Soft palate and tongue base are normal. Vallecular is normal. Hypopharynx is within normal limits. Vocal cords are midline and symmetric. The trachea is normal. Salivary glands: Submandibular and parotid glands and ducts are within normal limits. Thyroid: Normal Lymph nodes: Multiple bulky lymph nodes are present bilaterally. Homogeneous node in the left level 2 station is likely palpable, measuring 3.4 x 2.3 x 3.0 cm. Left posterior level 2 and level 3 lymph node measures up to 3.6 cm. A left level 3 node or nodal mass measures 4.6 x 3.7 x 5.3 cm. Enlarged supraclavicular nodes are present bilaterally, right greater than left. Marked adenopathy extends into the upper mediastinum. The largest right paratracheal node measures 5.2 x 3.5 cm. Left prevascular nodes measure up to 4.3 x 4.1 cm. Vascular: The right internal jugular vein is displaced anteriorly without occlusion. No arterial lesions are present. Limited intracranial: Within  normal limits. Visualized orbits: The globes and orbits are within normal limits. Mastoids and visualized paranasal sinuses: The paranasal sinuses and mastoid air cells are clear. Skeleton: Vertebral body heights alignment are normal. No focal lytic or blastic lesions are present. Upper chest: The lung apices are clear. IMPRESSION: 1. Extensive bulky adenopathy throughout the neck bilaterally, right greater than left. This is most concerning for a lymphoproliferative disorder such as lymphoma. 2. No primary lesion is evident. 3. The right internal jugular vein is displaced anteriorly without occlusion. These results were called by telephone at the time of interpretation on 05/11/2019 at 3:30 pm [facility] , who verbally acknowledged these results. Electronically Signed   By: CSan MorelleM.D.   On: 05/11/2019 15:33   CT Angio Chest PE W and/or Wo Contrast  Result Date: 05/11/2019 CLINICAL DATA:  New diagnosis of probable lymphoma in the neck. Tachycardia. Rule out pulmonary embolism. Evaluate for adenopathy. EXAM: CT ANGIOGRAPHY CHEST CT ABDOMEN AND PELVIS WITH CONTRAST TECHNIQUE: Multidetector CT imaging of the chest was performed using the standard protocol during bolus administration of intravenous contrast. Multiplanar CT image reconstructions and MIPs were obtained to evaluate the vascular anatomy. Multidetector CT imaging of the abdomen and pelvis was performed using the standard protocol during bolus administration of intravenous contrast. CONTRAST:  1059mOMNIPAQUE IOHEXOL 350 MG/ML SOLN COMPARISON:  None. FINDINGS: CTA CHEST FINDINGS Cardiovascular: The quality of this exam for evaluation of pulmonary embolism is poor to moderate. The bolus is suboptimally timed, with contrast centered in the SVC. No pulmonary embolism to the large lobar level. Normal aortic caliber. Mild cardiomegaly. Small pericardial effusion. Mediastinum/Nodes: Massive low cervical/supraclavicular adenopathy,  as on dedicated neck CT.  Left axillary adenopathy including at 1.5 cm on 29/5. Confluent mediastinal adenopathy, with a nodal conglomerate in the prevascular space measuring on the order of 8.9 x 6.2 cm on 41/5. Right paratracheal node measures 4.4 cm on image 43/5. Right hilar adenopathy at 2.8 cm on 56/5. Right paravertebral adenopathy versus extramedullary hematopoiesis. Example at 1.9 x 4.2 cm on 120/5. Lungs/Pleura: No pleural fluid. Pleural-based nodules, including the right upper lobe at 7 mm on 57/6 most likely represent subpleural lymph nodes. No lobar consolidation. Musculoskeletal: Moderate bilateral gynecomastia. No acute osseous abnormality. Review of the MIP images confirms the above findings. CT ABDOMEN and PELVIS FINDINGS Hepatobiliary: Hepatomegaly at 23.8 cm craniocaudal. Mild hepatic steatosis with sparing adjacent the gallbladder. Normal gallbladder, without biliary ductal dilatation. Pancreas: Normal, without mass or ductal dilatation. Spleen: Splenomegaly, including at 16.7 x 10.7 x 13.2 cm. Heterogeneous splenic density, likely due to lymphomatous involvement. Example vague inferior splenic mass of 4.0 cm on 37/3. Adrenals/Urinary Tract: Normal adrenal glands. Normal kidneys, without hydronephrosis. Normal urinary bladder. Stomach/Bowel: Normal stomach, without wall thickening. Normal colon, appendix, and terminal ileum. Normal small bowel. Vascular/Lymphatic: Normal caliber of the aorta and branch vessels. Abdominal retroperitoneal adenopathy, with a 1.8 cm left periaortic node on 38/3. Porta hepatis node of 1.2 cm on 33/3. No pelvic sidewall adenopathy. Reproductive: Normal prostate. Other: No significant free fluid. Musculoskeletal: No acute osseous abnormality. Review of the MIP images confirms the above findings. IMPRESSION: 1. suboptimal evaluation for pulmonary embolism. No large embolism as detailed above. 2. Adenopathy within the chest and abdomen, consistent with lymphoma. 3.  Hepatosplenomegaly with heterogeneous splenic enhancement/attenuation, suspicious for splenic involvement. 4. Paravertebral adenopathy versus extramedullary hematopoiesis. 5. Bilateral gynecomastia. Electronically Signed   By: Abigail Miyamoto M.D.   On: 05/11/2019 17:26   CT ABDOMEN PELVIS W CONTRAST  Result Date: 05/11/2019 CLINICAL DATA:  New diagnosis of probable lymphoma in the neck. Tachycardia. Rule out pulmonary embolism. Evaluate for adenopathy. EXAM: CT ANGIOGRAPHY CHEST CT ABDOMEN AND PELVIS WITH CONTRAST TECHNIQUE: Multidetector CT imaging of the chest was performed using the standard protocol during bolus administration of intravenous contrast. Multiplanar CT image reconstructions and MIPs were obtained to evaluate the vascular anatomy. Multidetector CT imaging of the abdomen and pelvis was performed using the standard protocol during bolus administration of intravenous contrast. CONTRAST:  177m OMNIPAQUE IOHEXOL 350 MG/ML SOLN COMPARISON:  None. FINDINGS: CTA CHEST FINDINGS Cardiovascular: The quality of this exam for evaluation of pulmonary embolism is poor to moderate. The bolus is suboptimally timed, with contrast centered in the SVC. No pulmonary embolism to the large lobar level. Normal aortic caliber. Mild cardiomegaly. Small pericardial effusion. Mediastinum/Nodes: Massive low cervical/supraclavicular adenopathy, as on dedicated neck CT. Left axillary adenopathy including at 1.5 cm on 29/5. Confluent mediastinal adenopathy, with a nodal conglomerate in the prevascular space measuring on the order of 8.9 x 6.2 cm on 41/5. Right paratracheal node measures 4.4 cm on image 43/5. Right hilar adenopathy at 2.8 cm on 56/5. Right paravertebral adenopathy versus extramedullary hematopoiesis. Example at 1.9 x 4.2 cm on 120/5. Lungs/Pleura: No pleural fluid. Pleural-based nodules, including the right upper lobe at 7 mm on 57/6 most likely represent subpleural lymph nodes. No lobar consolidation.  Musculoskeletal: Moderate bilateral gynecomastia. No acute osseous abnormality. Review of the MIP images confirms the above findings. CT ABDOMEN and PELVIS FINDINGS Hepatobiliary: Hepatomegaly at 23.8 cm craniocaudal. Mild hepatic steatosis with sparing adjacent the gallbladder. Normal gallbladder, without biliary ductal dilatation. Pancreas: Normal, without mass or ductal dilatation. Spleen:  Splenomegaly, including at 16.7 x 10.7 x 13.2 cm. Heterogeneous splenic density, likely due to lymphomatous involvement. Example vague inferior splenic mass of 4.0 cm on 37/3. Adrenals/Urinary Tract: Normal adrenal glands. Normal kidneys, without hydronephrosis. Normal urinary bladder. Stomach/Bowel: Normal stomach, without wall thickening. Normal colon, appendix, and terminal ileum. Normal small bowel. Vascular/Lymphatic: Normal caliber of the aorta and branch vessels. Abdominal retroperitoneal adenopathy, with a 1.8 cm left periaortic node on 38/3. Porta hepatis node of 1.2 cm on 33/3. No pelvic sidewall adenopathy. Reproductive: Normal prostate. Other: No significant free fluid. Musculoskeletal: No acute osseous abnormality. Review of the MIP images confirms the above findings. IMPRESSION: 1. suboptimal evaluation for pulmonary embolism. No large embolism as detailed above. 2. Adenopathy within the chest and abdomen, consistent with lymphoma. 3. Hepatosplenomegaly with heterogeneous splenic enhancement/attenuation, suspicious for splenic involvement. 4. Paravertebral adenopathy versus extramedullary hematopoiesis. 5. Bilateral gynecomastia. Electronically Signed   By: Abigail Miyamoto M.D.   On: 05/11/2019 17:26   NM PET Image Initial (PI) Skull Base To Thigh  Result Date: 05/22/2019 CLINICAL DATA:  Initial treatment strategy for lymphadenopathy. Concern for lymphoma. Elevated white blood cell count (28,000) and decreased hemoglobin (7.6) EXAM: NUCLEAR MEDICINE PET SKULL BASE TO THIGH TECHNIQUE: 12.0 mCi F-18 FDG was  injected intravenously. Full-ring PET imaging was performed from the skull base to thigh after the radiotracer. CT data was obtained and used for attenuation correction and anatomic localization. Fasting blood glucose: 85 mg/dl COMPARISON:  CT 05/11/2019 FINDINGS: Mediastinal blood pool activity: SUV max 1.2 Liver activity: SUV max NA NECK: Bulky intensely hypermetabolic bilateral cervical adenopathy extends from the level 2 nodal station to the supraclavicular nodes. Example cluster of nodes in the LEFT supraclavicular neck with SUV max equal 23.3. Incidental CT findings: The bulky nodes measure up to 2.4 cm CHEST: Bulky hypermetabolic mediastinal lymph nodes. A prevascular node on the LEFT measures 3 cm with SUV max equal 20.0. Subcarinal nodes with SUV max equal 14.1. Hypermetabolic moderately enlarged LEFT axillary lymph node with SUV max equal 16.8. Two subpleural nodule RIGHT middle lobe subpleural space (image 76/4) have associated metabolic activity. Pleural base mass in the medial RIGHT lower lobe measuring 4.5 by 3.1 cm with intense metabolic activity (SUV max equal 16.1 Incidental CT findings: none ABDOMEN/PELVIS: The liver is enlarged and hypodense suggesting hepatic steatosis. No focal hypermetabolic activity liver. The spleen has multiple foci of intense metabolic activity with SUV max equal 17.7. Mild spleen enlargement. There are hypermetabolic retroperitoneal periaortic lymph nodes which are relatively small. Example node LEFT aorta with SUV max equal 14.1. Moderately enlarged pelvic lymph nodes. One external iliac lymph node on the LEFT measures 10 mm with SUV max equal 10.1. Incidental CT findings: none SKELETON: There is diffuse hypermetabolic activity throughout the axillary and proximal appendicular skeleton. Activity is moderate. For example of the L5 vertebral body SUV max equal 7.2. Activity in the RIGHT proximal femur SUV max equal 5.7. No focal lesion. The CT portion exam no lesion  identified. Incidental CT findings: none IMPRESSION: 1. Bulky hypermetabolic cervical and mediastinal adenopathy consistent with high-grade lymphoma. 2. Evidence of metastasis to the pleural space in the RIGHT lung. 3. Multiple foci of hypermetabolic lymphoma involvement in the spleen. 4. Mildly enlarged hypermetabolic retroperitoneal periaortic adenopathy and iliac adenopathy. 5. Diffuse hypermetabolic activity in the marrow which is moderate in intensity. This may relate to patient's anemic condition and marrow stimulation. Cannot exclude marrow involvement by lymphoma although less favored. Electronically Signed   By: Suzy Bouchard  M.D.   On: 05/22/2019 17:22   IR US Guide Bx Asp/Drain  Result Date: 05/27/2019 INDICATION: 18 year old with extensive lymphadenopathy and suspect lymphoma. Patient needs a tissue diagnosis. Ultrasound-guided lymph node biopsy was performed immediately following the CT-guided bone marrow biopsy. EXAM: ULTRASOUND-GUIDED CORE BIOPSY OF LEFT SUPRACLAVICULAR LYMPH NODE MEDICATIONS: 1 mg Versed ANESTHESIA/SEDATION: Moderate Sedation Time: 10 minutes. The sedation time was a continuation of the sedation from the CT-guided bone marrow biopsy. The patient's level of consciousness and vital signs were monitored continuously by radiology nursing throughout the procedure under my direct supervision. FLUOROSCOPY TIME:  None COMPLICATIONS: None immediate. PROCEDURE: The procedure was explained to the patient. The risks and benefits of the procedure were discussed and the patient's questions were addressed. Informed consent was obtained from the patient. Left side of the neck was evaluated with ultrasound. Multiple large lymph nodes were identified. Suitable lymph node was identified in the left supraclavicular area. Skin was prepped with chlorhexidine and sterile field was created. Skin and soft tissues were anesthetized with 1% lidocaine. Using ultrasound guidance, 18 gauge core needle was  directed into the targeted lymph node. Total of 6 core biopsies were obtained. Specimens placed in saline. Bandage placed over the puncture site. FINDINGS: Multiple large hypoechoic lymph nodes throughout the left supraclavicular region. Total of 6 core biopsies were obtained from one of the large supraclavicular lymph nodes. No significant bleeding or hematoma formation following the core biopsies. IMPRESSION: Ultrasound-guided core biopsy of a left supraclavicular lymph node. Electronically Signed   By: Markus Daft M.D.   On: 05/27/2019 15:44   CT Biopsy  Result Date: 05/27/2019 INDICATION: 18 year old with extensive lymphadenopathy and suspect lymphoma. Request for bone marrow biopsy. EXAM: CT GUIDED BONE MARROW ASPIRATES AND BIOPSY Physician: Stephan Minister. Anselm Pancoast, MD MEDICATIONS: None. ANESTHESIA/SEDATION: Fentanyl 100 mcg IV; Versed 2.0 mg IV, Benadryl 25 mcg IV Moderate Sedation Time:  25 minutes The patient was continuously monitored during the procedure by the interventional radiology nurse under my direct supervision. COMPLICATIONS: None immediate. PROCEDURE: The procedure was explained to the patient. The risks and benefits of the procedure were discussed and the patient's questions were addressed. Informed consent was obtained from the patient. The patient was placed prone on CT table. Images of the pelvis were obtained. The right side of back was prepped and draped in sterile fashion. The skin and right posterior ilium were anesthetized with 1% lidocaine. 11 gauge bone needle was directed into the right ilium with CT guidance. Two aspirates and one core biopsy were obtained. Bandage placed over the puncture site. IMPRESSION: CT guided bone marrow aspiration and core biopsy. Electronically Signed   By: Markus Daft M.D.   On: 05/27/2019 15:31   CT BONE MARROW BIOPSY & ASPIRATION Order for BM BX  Result Date: 05/27/2019 INDICATION: 18 year old with extensive lymphadenopathy and suspect lymphoma. Request for  bone marrow biopsy. EXAM: CT GUIDED BONE MARROW ASPIRATES AND BIOPSY Physician: Stephan Minister. Anselm Pancoast, MD MEDICATIONS: None. ANESTHESIA/SEDATION: Fentanyl 100 mcg IV; Versed 2.0 mg IV, Benadryl 25 mcg IV Moderate Sedation Time:  25 minutes The patient was continuously monitored during the procedure by the interventional radiology nurse under my direct supervision. COMPLICATIONS: None immediate. PROCEDURE: The procedure was explained to the patient. The risks and benefits of the procedure were discussed and the patient's questions were addressed. Informed consent was obtained from the patient. The patient was placed prone on CT table. Images of the pelvis were obtained. The right side of back was prepped and draped  in sterile fashion. The skin and right posterior ilium were anesthetized with 1% lidocaine. 11 gauge bone needle was directed into the right ilium with CT guidance. Two aspirates and one core biopsy were obtained. Bandage placed over the puncture site. IMPRESSION: CT guided bone marrow aspiration and core biopsy. Electronically Signed   By: Markus Daft M.D.   On: 05/27/2019 15:31   ECHOCARDIOGRAM COMPLETE  Result Date: 05/25/2019   ECHOCARDIOGRAM REPORT   Patient Name:   Terry Quinn Date of Exam: 05/25/2019 Medical Rec #:  [id number]           Height:       72.0 in Accession #:    [id number]          Weight:       224.4 lb Date of Birth:  December 12, 2000          BSA:          2.24 m Patient Age:    18 years            BP:           127/75 mmHg Patient Gender: M                   HR:           117 bpm. Exam Location:  Outpatient Procedure: 2D Echo, Cardiac Doppler, Color Doppler and Strain Analysis Indications:    Z51.11 Encounter for antineoplastic chemotheraphy  History:        Patient has no prior history of Echocardiogram examinations.  Sonographer:    Jonelle Sidle Dance Referring Phys: [id number] [place]  1. Left ventricular ejection fraction, by visual estimation, is 50%. The left  ventricle has low normal function. There is no left ventricular hypertrophy.  2. The average left ventricular global longitudinal strain is -14.1 %.  3. Left ventricular diastolic parameters are indeterminate.  4. Global right ventricle has normal systolic function.The right ventricular size is normal. No increase in right ventricular wall thickness.  5. Left atrial size was normal.  6. Right atrial size was normal.  7. Small pericardial effusion.  8. The pericardial effusion is circumferential.  9. The mitral valve is normal in structure. Trivial mitral valve regurgitation. No evidence of mitral stenosis. 10. The tricuspid valve is normal in structure. Tricuspid valve regurgitation is not demonstrated. 11. The aortic valve is normal in structure. Aortic valve regurgitation is not visualized. No evidence of aortic valve sclerosis or stenosis. 12. The pulmonic valve was normal in structure. Pulmonic valve regurgitation is trivial. 13. TR signal is inadequate for assessing pulmonary artery systolic pressure. 14. The inferior vena cava is normal in size with greater than 50% respiratory variability, suggesting right atrial pressure of 3 mmHg. 15. There is borderline dilatation of the aortic root measuring 39 mm. FINDINGS  Left Ventricle: Left ventricular ejection fraction, by visual estimation, is 50%. The left ventricle has low normal function. The average left ventricular global longitudinal strain is -14.1 %. The left ventricle is not well visualized. There is no left  ventricular hypertrophy. Left ventricular diastolic parameters are indeterminate. Normal left atrial pressure. Right Ventricle: The right ventricular size is normal. No increase in right ventricular wall thickness. Global RV systolic function is has normal systolic function. Left Atrium: Left atrial size was normal in size. Right Atrium: Right atrial size was normal in size Pericardium: A small pericardial effusion is present. The pericardial effusion  is circumferential. There is no evidence of pericardial  constriction. There is no evidence of cardiac tamponade. Mitral Valve: The mitral valve is normal in structure. Trivial mitral valve regurgitation. No evidence of mitral valve stenosis by observation. Tricuspid Valve: The tricuspid valve is normal in structure. Tricuspid valve regurgitation is not demonstrated. Aortic Valve: The aortic valve is normal in structure. Aortic valve regurgitation is not visualized. The aortic valve is structurally normal, with no evidence of sclerosis or stenosis. Pulmonic Valve: The pulmonic valve was normal in structure. Pulmonic valve regurgitation is trivial. Pulmonic regurgitation is trivial. Aorta: Aortic dilatation noted. There is borderline dilatation of the aortic root measuring 39 mm. Venous: The inferior vena cava is normal in size with greater than 50% respiratory variability, suggesting right atrial pressure of 3 mmHg. IAS/Shunts: No atrial level shunt detected by color flow Doppler. There is no evidence of a patent foramen ovale. No ventricular septal defect is seen or detected. There is no evidence of an atrial septal defect.  LEFT VENTRICLE PLAX 2D LVIDd:         3.90 cm  Diastology LVIDs:         2.40 cm  LV e' lateral:   14.20 cm/s LV PW:         1.20 cm  LV E/e' lateral: 6.0 LV IVS:        0.90 cm  LV e' medial:    13.20 cm/s LVOT diam:     2.20 cm  LV E/e' medial:  6.5 LV SV:         46 ml LV SV Index:   19.90    2D Longitudinal Strain LVOT Area:     3.80 cm 2D Strain GLS Avg:     -14.1 %  RIGHT VENTRICLE             IVC RV Basal diam:  2.50 cm     IVC diam: 1.95 cm RV S prime:     23.90 cm/s TAPSE (M-mode): 2.6 cm LEFT ATRIUM             Index       RIGHT ATRIUM           Index LA diam:        3.30 cm 1.47 cm/m  RA Area:     10.50 cm LA Vol (A2C):   43.6 ml 19.49 ml/m RA Volume:   17.00 ml  7.60 ml/m LA Vol (A4C):   56.8 ml 25.39 ml/m LA Biplane Vol: 50.8 ml 22.71 ml/m  AORTIC VALVE LVOT Vmax:   113.00  cm/s LVOT Vmean:  67.600 cm/s LVOT VTI:    0.151 m  AORTA Ao Root diam: 3.90 cm Ao Asc diam:  2.60 cm MITRAL VALVE MV Area (PHT): 4.36 cm            SHUNTS MV PHT:        50.46 msec          Systemic VTI:  0.15 m MV Decel Time: 174 msec            Systemic Diam: 2.20 cm MV E velocity: 85.55 cm/s 103 cm/s  Cherlynn Kaiser MD Electronically signed by Cherlynn Kaiser MD Signature Date/Time: 05/25/2019/3:35:18 PM    Final     ASSESSMENT AND PLAN:  This is a very pleasant 18 year old male diagnosed with Classical Hodgkin Lymphoma, Nodular Sclerosis subtype. He presented with bulky bilateral cervical lymphadenopathy, mediastinal lymphadenopathy, and mildly enlarged retroperitoneal periaortic adenopathy and iliac adenopathy. He also presented with involvement of the  spleen. He was diagnosed in December 2020.  I had a lengthy discussion with the patient today about his condition and treatment options.  I discussed with the patient treatment with systemic chemotherapy with ABVD.  I discussed with the patient the adverse effect of this treatment including but not limited to alopecia, myelosuppression, nausea and vomiting, peripheral neuropathy, pulmonary or cardiac dysfunction. He is expected to start the first cycle of this treatment next week. He will have a chemotherapy education class before the first dose of his treatment. I will send prescription for Compazine 10 mg p.o. every 6 hours as needed for nausea in addition to allopurinol 100 mg p.o. twice daily to his pharmacy. He will come back for follow-up visit with the start of day 15 of the first cycle. I will arrange for the patient to have a port-a-cath placed prior to starting his first cycle of treatment. I have sent a prescription for EMLA cream to the patient's pharmacy.  I will arrange for the patient to have a chemotherapy education class prior to having his first cycle of treatment I will arrange for the patient to have his pre-procedure covid test  while in the clinic to avoid further delay with obtaining his baseline PFTs tomorrow as scheduled.  For his anemia, I will arrange for the patient to receive 2 units of RBCs.  The patient voices understanding of current disease status and treatment options and is in agreement with the current care plan.  All questions were answered. The patient knows to call the clinic with any problems, questions or concerns. We can certainly see the patient much sooner if necessary.  I spent 30 minutes counseling the patient face to face. The total time spent in the appointment was 40 minutes.  Disclaimer: This note was dictated with voice recognition software. Similar sounding words can inadvertently be transcribed and may not be corrected upon review.

## 2019-06-03 ENCOUNTER — Telehealth: Payer: Self-pay | Admitting: Medical Oncology

## 2019-06-03 ENCOUNTER — Encounter (HOSPITAL_COMMUNITY): Payer: Self-pay | Admitting: Internal Medicine

## 2019-06-03 ENCOUNTER — Ambulatory Visit (HOSPITAL_COMMUNITY)
Admission: RE | Admit: 2019-06-03 | Discharge: 2019-06-03 | Disposition: A | Discharge status: Home / Self Care | Payer: Medicaid Other | Source: Ambulatory Visit | Attending: Physician Assistant | Admitting: Physician Assistant

## 2019-06-03 ENCOUNTER — Other Ambulatory Visit: Payer: Self-pay | Admitting: Radiology

## 2019-06-03 DIAGNOSIS — R591 Generalized enlarged lymph nodes: Secondary | ICD-10-CM | POA: Diagnosis not present

## 2019-06-03 LAB — TYPE AND SCREEN
ABO/RH(D): O POS
Antibody Screen: NEGATIVE
Unit division: 0
Unit division: 0

## 2019-06-03 LAB — PULMONARY FUNCTION TEST
DL/VA % pred: 124 %
DL/VA: 6.41 ml/min/mmHg/L
DLCO cor % pred: 107 %
DLCO cor: 36.82 ml/min/mmHg
DLCO unc % pred: 78 %
DLCO unc: 26.7 ml/min/mmHg
FEF 25-75 Post: 1.5 L/sec
FEF 25-75 Pre: 1.85 L/sec
FEF2575-%Change-Post: -18 %
FEF2575-%Pred-Post: 32 %
FEF2575-%Pred-Pre: 39 %
FEV1-%Change-Post: -1 %
FEV1-%Pred-Post: 61 %
FEV1-%Pred-Pre: 62 %
FEV1-Post: 2.57 L
FEV1-Pre: 2.62 L
FEV1FVC-%Change-Post: 1 %
FEV1FVC-%Pred-Pre: 60 %
FEV6-%Change-Post: -3 %
FEV6-%Pred-Post: 100 %
FEV6-%Pred-Pre: 103 %
FEV6-Post: 4.88 L
FEV6-Pre: 5.04 L
FEV6FVC-%Pred-Post: 100 %
FEV6FVC-%Pred-Pre: 100 %
FVC-%Change-Post: -3 %
FVC-%Pred-Post: 99 %
FVC-%Pred-Pre: 102 %
FVC-Post: 4.88 L
FVC-Pre: 5.04 L
Post FEV1/FVC ratio: 53 %
Post FEV6/FVC ratio: 100 %
Pre FEV1/FVC ratio: 52 %
Pre FEV6/FVC Ratio: 100 %
RV % pred: 64 %
RV: 0.98 L
TLC % pred: 83 %
TLC: 6.04 L

## 2019-06-03 LAB — BPAM RBC
Blood Product Expiration Date: 202101302359
Blood Product Expiration Date: 202101302359
ISSUE DATE / TIME: 202012291333
ISSUE DATE / TIME: 202012291333
Unit Type and Rh: 5100
Unit Type and Rh: 5100

## 2019-06-03 LAB — SURGICAL PATHOLOGY

## 2019-06-03 MED ORDER — ALBUTEROL SULFATE (2.5 MG/3ML) 0.083% IN NEBU
2.5000 mg | INHALATION_SOLUTION | Freq: Once | RESPIRATORY_TRACT | Status: AC
  Administered 2019-06-03: 2.5 mg via RESPIRATORY_TRACT

## 2019-06-03 NOTE — Telephone Encounter (Signed)
Pt cannot do port on jan 4th.

## 2019-06-04 ENCOUNTER — Telehealth: Payer: Self-pay | Admitting: Internal Medicine

## 2019-06-04 NOTE — Telephone Encounter (Signed)
Scheduled per los. Called and spoke with patient. Confirmed appts  

## 2019-06-08 ENCOUNTER — Other Ambulatory Visit: Payer: Self-pay | Admitting: Radiology

## 2019-06-08 ENCOUNTER — Ambulatory Visit (HOSPITAL_COMMUNITY): Admission: RE | Admit: 2019-06-08 | Payer: Self-pay | Source: Ambulatory Visit

## 2019-06-09 ENCOUNTER — Telehealth: Payer: Self-pay | Admitting: *Deleted

## 2019-06-09 ENCOUNTER — Inpatient Hospital Stay: Payer: Medicaid Other

## 2019-06-09 ENCOUNTER — Encounter: Payer: Self-pay | Admitting: Internal Medicine

## 2019-06-09 NOTE — Telephone Encounter (Signed)
Called pt to do pt Education but pt states that he is unable to do at this time due to being busy.  He would like to change appt to tomorrow @ 8am.  Message sent to schedulers to rearrange.

## 2019-06-09 NOTE — Progress Notes (Signed)
Called patient to introduce myself as Arboriculturist and to offer available resources.   Will go over one-time $700 [employer] and qualifications to assist with personal expenses while going through treatment as well as funds through the [facility] and [place] through [employer] and [employer]) which may have limited availability.    Left my contact information for him to return my call.

## 2019-06-10 ENCOUNTER — Inpatient Hospital Stay: Payer: Medicaid Other | Attending: Physician Assistant

## 2019-06-10 ENCOUNTER — Telehealth: Payer: Self-pay | Admitting: *Deleted

## 2019-06-10 DIAGNOSIS — Z79899 Other long term (current) drug therapy: Secondary | ICD-10-CM | POA: Insufficient documentation

## 2019-06-10 DIAGNOSIS — C8118 Nodular sclerosis classical Hodgkin lymphoma, lymph nodes of multiple sites: Secondary | ICD-10-CM | POA: Insufficient documentation

## 2019-06-10 DIAGNOSIS — R110 Nausea: Secondary | ICD-10-CM | POA: Insufficient documentation

## 2019-06-10 DIAGNOSIS — I340 Nonrheumatic mitral (valve) insufficiency: Secondary | ICD-10-CM | POA: Insufficient documentation

## 2019-06-10 DIAGNOSIS — R5383 Other fatigue: Secondary | ICD-10-CM | POA: Insufficient documentation

## 2019-06-10 DIAGNOSIS — D649 Anemia, unspecified: Secondary | ICD-10-CM | POA: Insufficient documentation

## 2019-06-10 DIAGNOSIS — R61 Generalized hyperhidrosis: Secondary | ICD-10-CM | POA: Insufficient documentation

## 2019-06-10 DIAGNOSIS — R531 Weakness: Secondary | ICD-10-CM | POA: Insufficient documentation

## 2019-06-10 DIAGNOSIS — R0609 Other forms of dyspnea: Secondary | ICD-10-CM | POA: Insufficient documentation

## 2019-06-10 DIAGNOSIS — Z5111 Encounter for antineoplastic chemotherapy: Secondary | ICD-10-CM | POA: Insufficient documentation

## 2019-06-11 ENCOUNTER — Inpatient Hospital Stay: Payer: Medicaid Other

## 2019-06-11 ENCOUNTER — Inpatient Hospital Stay (HOSPITAL_BASED_OUTPATIENT_CLINIC_OR_DEPARTMENT_OTHER): Payer: Medicaid Other | Admitting: Internal Medicine

## 2019-06-11 ENCOUNTER — Other Ambulatory Visit: Payer: Self-pay

## 2019-06-11 ENCOUNTER — Encounter: Payer: Self-pay | Admitting: *Deleted

## 2019-06-11 ENCOUNTER — Encounter: Payer: Self-pay | Admitting: Internal Medicine

## 2019-06-11 ENCOUNTER — Other Ambulatory Visit: Payer: Self-pay | Admitting: *Deleted

## 2019-06-11 VITALS — HR 115

## 2019-06-11 VITALS — BP 114/71 | HR 115 | Temp 97.7°F | Resp 16 | Ht 72.0 in | Wt 211.8 lb

## 2019-06-11 DIAGNOSIS — D649 Anemia, unspecified: Secondary | ICD-10-CM | POA: Diagnosis not present

## 2019-06-11 DIAGNOSIS — R61 Generalized hyperhidrosis: Secondary | ICD-10-CM | POA: Diagnosis not present

## 2019-06-11 DIAGNOSIS — C8111 Nodular sclerosis classical Hodgkin lymphoma, lymph nodes of head, face, and neck: Secondary | ICD-10-CM

## 2019-06-11 DIAGNOSIS — R5383 Other fatigue: Secondary | ICD-10-CM | POA: Diagnosis not present

## 2019-06-11 DIAGNOSIS — Z5111 Encounter for antineoplastic chemotherapy: Secondary | ICD-10-CM | POA: Diagnosis not present

## 2019-06-11 DIAGNOSIS — Z79899 Other long term (current) drug therapy: Secondary | ICD-10-CM | POA: Diagnosis not present

## 2019-06-11 DIAGNOSIS — R0609 Other forms of dyspnea: Secondary | ICD-10-CM | POA: Diagnosis not present

## 2019-06-11 DIAGNOSIS — R531 Weakness: Secondary | ICD-10-CM | POA: Diagnosis not present

## 2019-06-11 DIAGNOSIS — C8118 Nodular sclerosis classical Hodgkin lymphoma, lymph nodes of multiple sites: Secondary | ICD-10-CM | POA: Diagnosis present

## 2019-06-11 DIAGNOSIS — I340 Nonrheumatic mitral (valve) insufficiency: Secondary | ICD-10-CM | POA: Diagnosis not present

## 2019-06-11 DIAGNOSIS — R110 Nausea: Secondary | ICD-10-CM | POA: Diagnosis not present

## 2019-06-11 LAB — CBC WITH DIFFERENTIAL (CANCER CENTER ONLY)
Abs Immature Granulocytes: 1.56 10*3/uL — ABNORMAL HIGH (ref 0.00–0.07)
Basophils Absolute: 0.1 10*3/uL (ref 0.0–0.1)
Basophils Relative: 0 %
Eosinophils Absolute: 0 10*3/uL (ref 0.0–0.5)
Eosinophils Relative: 0 %
HCT: 25.3 % — ABNORMAL LOW (ref 39.0–52.0)
Hemoglobin: 7.7 g/dL — ABNORMAL LOW (ref 13.0–17.0)
Immature Granulocytes: 5 %
Lymphocytes Relative: 3 %
Lymphs Abs: 0.9 10*3/uL (ref 0.7–4.0)
MCH: 23.5 pg — ABNORMAL LOW (ref 26.0–34.0)
MCHC: 30.4 g/dL (ref 30.0–36.0)
MCV: 77.4 fL — ABNORMAL LOW (ref 80.0–100.0)
Monocytes Absolute: 1.5 10*3/uL — ABNORMAL HIGH (ref 0.1–1.0)
Monocytes Relative: 5 %
Neutro Abs: 28.5 10*3/uL — ABNORMAL HIGH (ref 1.7–7.7)
Neutrophils Relative %: 87 %
Platelet Count: 347 10*3/uL (ref 150–400)
RBC: 3.27 MIL/uL — ABNORMAL LOW (ref 4.22–5.81)
RDW: 20.8 % — ABNORMAL HIGH (ref 11.5–15.5)
WBC Count: 32.5 10*3/uL — ABNORMAL HIGH (ref 4.0–10.5)
nRBC: 0 % (ref 0.0–0.2)

## 2019-06-11 LAB — CMP (CANCER CENTER ONLY)
ALT: 80 U/L — ABNORMAL HIGH (ref 0–44)
AST: 85 U/L — ABNORMAL HIGH (ref 15–41)
Albumin: 2.2 g/dL — ABNORMAL LOW (ref 3.5–5.0)
Alkaline Phosphatase: 328 U/L — ABNORMAL HIGH (ref 38–126)
Anion gap: 12 (ref 5–15)
BUN: 6 mg/dL (ref 6–20)
CO2: 27 mmol/L (ref 22–32)
Calcium: 8.7 mg/dL — ABNORMAL LOW (ref 8.9–10.3)
Chloride: 96 mmol/L — ABNORMAL LOW (ref 98–111)
Creatinine: 0.68 mg/dL (ref 0.61–1.24)
GFR, Est AFR Am: >60 mL/min (ref >60)
GFR, Estimated: >60 mL/min (ref >60)
Glucose, Bld: 92 mg/dL (ref 70–99)
Potassium: 3.8 mmol/L (ref 3.5–5.1)
Sodium: 135 mmol/L (ref 135–145)
Total Bilirubin: 0.7 mg/dL (ref 0.3–1.2)
Total Protein: 9.2 g/dL — ABNORMAL HIGH (ref 6.5–8.1)

## 2019-06-11 LAB — TYPE AND SCREEN
ABO/RH(D): O POS
Antibody Screen: NEGATIVE

## 2019-06-11 MED ORDER — SODIUM CHLORIDE 0.9 % IV SOLN
150.0000 mg | Freq: Once | INTRAVENOUS | Status: AC
  Administered 2019-06-11: 150 mg via INTRAVENOUS
  Filled 2019-06-11: qty 5

## 2019-06-11 MED ORDER — ALLOPURINOL 100 MG PO TABS: 100.0000 mg | ORAL_TABLET | Freq: Two times a day (BID) | ORAL | 2 refills | Status: DC

## 2019-06-11 MED ORDER — DEXAMETHASONE SODIUM PHOSPHATE 10 MG/ML IJ SOLN
INTRAMUSCULAR | Status: AC
  Filled 2019-06-11: qty 1

## 2019-06-11 MED ORDER — SODIUM CHLORIDE 0.9 % IV SOLN
10.0000 [IU]/m2 | Freq: Once | INTRAVENOUS | Status: AC
  Administered 2019-06-11: 22 [IU] via INTRAVENOUS
  Filled 2019-06-11: qty 7.33

## 2019-06-11 MED ORDER — SODIUM CHLORIDE 0.9 % IV SOLN: 10.0000 mg | Freq: Once | INTRAVENOUS | Status: DC

## 2019-06-11 MED ORDER — DOXORUBICIN HCL CHEMO IV INJECTION 2 MG/ML
25.0000 mg/m2 | Freq: Once | INTRAVENOUS | Status: AC
  Administered 2019-06-11: 56 mg via INTRAVENOUS
  Filled 2019-06-11: qty 28

## 2019-06-11 MED ORDER — LIDOCAINE-PRILOCAINE 2.5-2.5 % EX CREA: 1.0000 "application " | TOPICAL_CREAM | CUTANEOUS | 1 refills | Status: DC | PRN

## 2019-06-11 MED ORDER — VINBLASTINE SULFATE CHEMO INJECTION 1 MG/ML
5.8500 mg/m2 | Freq: Once | INTRAVENOUS | Status: AC
  Administered 2019-06-11: 13 mg via INTRAVENOUS
  Filled 2019-06-11: qty 13

## 2019-06-11 MED ORDER — SODIUM CHLORIDE 0.9 % IV SOLN
375.0000 mg/m2 | Freq: Once | INTRAVENOUS | Status: AC
  Administered 2019-06-11: 830 mg via INTRAVENOUS
  Filled 2019-06-11: qty 83

## 2019-06-11 MED ORDER — PALONOSETRON HCL INJECTION 0.25 MG/5ML
0.2500 mg | Freq: Once | INTRAVENOUS | Status: AC
  Administered 2019-06-11: 0.25 mg via INTRAVENOUS

## 2019-06-11 MED ORDER — PROCHLORPERAZINE MALEATE 10 MG PO TABS: 10.0000 mg | ORAL_TABLET | Freq: Four times a day (QID) | ORAL | 0 refills | Status: DC | PRN

## 2019-06-11 MED ORDER — DEXAMETHASONE SODIUM PHOSPHATE 10 MG/ML IJ SOLN
10.0000 mg | Freq: Once | INTRAMUSCULAR | Status: AC
  Administered 2019-06-11: 10 mg via INTRAVENOUS

## 2019-06-11 MED ORDER — PALONOSETRON HCL INJECTION 0.25 MG/5ML
INTRAVENOUS | Status: AC
  Filled 2019-06-11: qty 5

## 2019-06-11 MED ORDER — SODIUM CHLORIDE 0.9 % IV SOLN
Freq: Once | INTRAVENOUS | Status: AC
  Filled 2019-06-11: qty 250

## 2019-06-11 MED FILL — ALLOPURINOL 100 MG TABS: 100 | 30 days supply | Qty: 60 | Fill #0

## 2019-06-11 MED FILL — LIDOCAINE-PRILOCAINE CREAM: 2.5-2.5 | 10 days supply | Qty: 30 | Fill #0

## 2019-06-11 MED FILL — PROCHLORPERAZINE 10 MG TAB: 10 | 7 days supply | Qty: 30 | Fill #0

## 2019-06-11 NOTE — Progress Notes (Signed)
[place] Work  Clinical Social Work met with patient in infusion room at request of medical oncology team.  Mr. Damaso reported this was his first treatment, he continues to feel fatigued and "sluggish".  CSW explore patient's emotions, patient reported feeling "numb" to cancer diagnosis and has experienced depression in the past.  CSW discussed relationship between depression and cancer diagnosis- CSW encouraged patient to follow up for counseling.  CSW and patient agreed most urgent concerns and high stressors is access to medications and applying for Medicaid.  Patient met with financial resource specialist today- CSW reiterated importance of patient's mother returning call to provide financial information to qualify for grant.  CSW requested patient's medications be moved to [facility] outpatient pharmacy.  Patient agreed to pick up medications after today's appointment.  CSW wrote down directions for how to get to pharmacy and how to apply for Medicaid.  Patient agreed to call CSW as questions arise.  Gwinda Maine, LCSW  Clinical Social Worker [facility]

## 2019-06-11 NOTE — Patient Instructions (Signed)
[place] Discharge Instructions for Patients Receiving Chemotherapy  Today you received the following chemotherapy agents:  Adriamycin, Vinblastine, Bleomycin, Dacarbazine  To help prevent nausea and vomiting after your treatment, we encourage you to take your nausea medication as prescribed.   If you develop nausea and vomiting that is not controlled by your nausea medication, call the clinic.   BELOW ARE SYMPTOMS THAT SHOULD BE REPORTED IMMEDIATELY:  *FEVER GREATER THAN 100.5 F  *CHILLS WITH OR WITHOUT FEVER  NAUSEA AND VOMITING THAT IS NOT CONTROLLED WITH YOUR NAUSEA MEDICATION  *UNUSUAL SHORTNESS OF BREATH  *UNUSUAL BRUISING OR BLEEDING  TENDERNESS IN MOUTH AND THROAT WITH OR WITHOUT PRESENCE OF ULCERS  *URINARY PROBLEMS  *BOWEL PROBLEMS  UNUSUAL RASH Items with * indicate a potential emergency and should be followed up as soon as possible.  Feel free to call the clinic should you have any questions or concerns. The clinic phone number is [phone].  Please show the [place] at check-in to the Emergency Department and triage nurse.

## 2019-06-11 NOTE — Progress Notes (Signed)
[place] Telephone:([phone]) [phone]   [phone]  OFFICE PROGRESS NOTE  Patient, No Pcp Per No address on file  DIAGNOSIS: Classical Hodgkin Lymphoma, Nodular Sclerosis subtype. He presented with bulky bilateral cervical lymphadenopathy, mediastinal lymphadenopathy, and mildly enlarged retroperitoneal periaortic adenopathy and iliac adenopathy. He also presented with involvement of the spleen. He was diagnosed in December 2020.   PRIOR THERAPY: None   CURRENT THERAPY: Systemic chemotherapy with doxorubicin, dacarbazine, vinblastine, and bleomycin on days 1 and 15 every 4 weeks.  First dose expected on January 7th, 2021.   INTERVAL HISTORY: Terry Quinn 19 y.o. male returns to the clinic today for follow-up visit. The patient continues to complain of increasing fatigue and weakness as well as weight loss or night sweats. He also continues to have the palpable lymphadenopathy in the left supraclavicular area. He denied having any current chest pain, shortness of breath, cough or hemoptysis. He denied having any fever or chills. He has no nausea, vomiting, diarrhea or constipation. He received 2 units of PRBCs transfusion last week and he felt better for a few days but starting feeling more tired and fatigued again. He had pulmonary function tests performed recently and they are adequate for treatment. He was supposed to have a Port-A-Cath placed before for this treatment but unfortunately for many logistic issues he could not do it. He also did not refill his medication for the allopurinol and antiemetics but he will try to get him started today. He is here today for evaluation before starting the first cycle of his treatment with ABVD.  MEDICAL HISTORY:No past medical history on file.  ALLERGIES:  has No Known Allergies.  MEDICATIONS:  Current Outpatient Medications  Medication Sig Dispense Refill  . allopurinol (ZYLOPRIM) 100 MG tablet Take 1 tablet (100  mg total) by mouth 2 (two) times daily. 60 tablet 2  . lidocaine-prilocaine (EMLA) cream Apply 1 application topically as needed. 30 g 1  . ondansetron (ZOFRAN ODT) 4 MG disintegrating tablet 87m ODT q4 hours prn nausea/vomit (Patient not taking: Reported on 05/18/2019) 10 tablet 0  . prochlorperazine (COMPAZINE) 10 MG tablet Take 1 tablet (10 mg total) by mouth every 6 (six) hours as needed for nausea or vomiting. 30 tablet 0   No current facility-administered medications for this visit.    SURGICAL HISTORY:  Past Surgical History:  Procedure Laterality Date  . IR [place] BX ASP/DRAIN  05/27/2019    REVIEW OF SYSTEMS:  Constitutional: positive for fatigue and weight loss Eyes: negative Ears, nose, mouth, throat, and face: negative Respiratory: positive for dyspnea on exertion Cardiovascular: negative Gastrointestinal: negative Genitourinary:negative Integument/breast: negative Hematologic/lymphatic: positive for lymphadenopathy Musculoskeletal:negative Neurological: negative Behavioral/Psych: negative Endocrine: negative Allergic/Immunologic: negative   PHYSICAL EXAMINATION: General appearance: alert, cooperative, fatigued and no distress Head: Normocephalic, without obvious abnormality, atraumatic Neck: marked anterior cervical adenopathy, no carotid bruit, no JVD, supple, symmetrical, trachea midline and thyroid not enlarged, symmetric, no tenderness/mass/nodules Lymph nodes: Cervical adenopathy: Large bilateral cervical and supraclavicular lymphadenopathy Resp: clear to auscultation bilaterally Back: symmetric, no curvature. ROM normal. No CVA tenderness. Cardio: regular rate and rhythm, S1, S2 normal, no murmur, click, rub or gallop GI: soft, non-tender; bowel sounds normal; no masses,  no organomegaly Extremities: extremities normal, atraumatic, no cyanosis or edema Neurologic: Alert and oriented X 3, normal strength and tone. Normal symmetric reflexes. Normal  coordination and gait  ECOG PERFORMANCE STATUS: 1 - Symptomatic but completely ambulatory  Blood pressure 114/71, pulse (Marland Kitchen  115, temperature 97.7 F (36.5 C), temperature source Temporal, resp. rate 16, height 6' (1.829 m), weight 211 lb 12.8 oz (96.1 kg), SpO2 98 %.  LABORATORY DATA: Lab Results  Component Value Date   WBC 32.5 (H) 06/11/2019   HGB 7.7 (L) 06/11/2019   HCT 25.3 (L) 06/11/2019   MCV 77.4 (L) 06/11/2019   PLT 347 06/11/2019      Chemistry      Component Value Date/Time   NA 135 06/11/2019 1118   K 3.8 06/11/2019 1118   CL 96 (L) 06/11/2019 1118   CO2 27 06/11/2019 1118   BUN 6 06/11/2019 1118   CREATININE 0.68 06/11/2019 1118      Component Value Date/Time   CALCIUM 8.7 (L) 06/11/2019 1118   ALKPHOS 328 (H) 06/11/2019 1118   AST 85 (H) 06/11/2019 1118   ALT 80 (H) 06/11/2019 1118   BILITOT 0.7 06/11/2019 1118       RADIOGRAPHIC STUDIES: NM PET Image Initial (PI) Skull Base To Thigh  Result Date: 05/22/2019 CLINICAL DATA:  Initial treatment strategy for lymphadenopathy. Concern for lymphoma. Elevated white blood cell count (28,000) and decreased hemoglobin (7.6) EXAM: NUCLEAR MEDICINE PET SKULL BASE TO THIGH TECHNIQUE: 12.0 mCi F-18 FDG was injected intravenously. Full-ring PET imaging was performed from the skull base to thigh after the radiotracer. CT data was obtained and used for attenuation correction and anatomic localization. Fasting blood glucose: 85 mg/dl COMPARISON:  CT 05/11/2019 FINDINGS: Mediastinal blood pool activity: SUV max 1.2 Liver activity: SUV max NA NECK: Bulky intensely hypermetabolic bilateral cervical adenopathy extends from the level 2 nodal station to the supraclavicular nodes. Example cluster of nodes in the LEFT supraclavicular neck with SUV max equal 23.3. Incidental CT findings: The bulky nodes measure up to 2.4 cm CHEST: Bulky hypermetabolic mediastinal lymph nodes. A prevascular node on the LEFT measures 3 cm with SUV max equal  20.0. Subcarinal nodes with SUV max equal 14.1. Hypermetabolic moderately enlarged LEFT axillary lymph node with SUV max equal 16.8. Two subpleural nodule RIGHT middle lobe subpleural space (image 76/4) have associated metabolic activity. Pleural base mass in the medial RIGHT lower lobe measuring 4.5 by 3.1 cm with intense metabolic activity (SUV max equal 16.1 Incidental CT findings: none ABDOMEN/PELVIS: The liver is enlarged and hypodense suggesting hepatic steatosis. No focal hypermetabolic activity liver. The spleen has multiple foci of intense metabolic activity with SUV max equal 17.7. Mild spleen enlargement. There are hypermetabolic retroperitoneal periaortic lymph nodes which are relatively small. Example node LEFT aorta with SUV max equal 14.1. Moderately enlarged pelvic lymph nodes. One external iliac lymph node on the LEFT measures 10 mm with SUV max equal 10.1. Incidental CT findings: none SKELETON: There is diffuse hypermetabolic activity throughout the axillary and proximal appendicular skeleton. Activity is moderate. For example of the L5 vertebral body SUV max equal 7.2. Activity in the RIGHT proximal femur SUV max equal 5.7. No focal lesion. The CT portion exam no lesion identified. Incidental CT findings: none IMPRESSION: 1. Bulky hypermetabolic cervical and mediastinal adenopathy consistent with high-grade lymphoma. 2. Evidence of metastasis to the pleural space in the RIGHT lung. 3. Multiple foci of hypermetabolic lymphoma involvement in the spleen. 4. Mildly enlarged hypermetabolic retroperitoneal periaortic adenopathy and iliac adenopathy. 5. Diffuse hypermetabolic activity in the marrow which is moderate in intensity. This may relate to patient's anemic condition and marrow stimulation. Cannot exclude marrow involvement by lymphoma although less favored. Electronically Signed   By: Helane Gunther.D.  On: 05/22/2019 17:22   IR US Guide Bx Asp/Drain  Result Date:  05/27/2019 INDICATION: 19 year old with extensive lymphadenopathy and suspect lymphoma. Patient needs a tissue diagnosis. Ultrasound-guided lymph node biopsy was performed immediately following the CT-guided bone marrow biopsy. EXAM: ULTRASOUND-GUIDED CORE BIOPSY OF LEFT SUPRACLAVICULAR LYMPH NODE MEDICATIONS: 1 mg Versed ANESTHESIA/SEDATION: Moderate Sedation Time: 10 minutes. The sedation time was a continuation of the sedation from the CT-guided bone marrow biopsy. The patient's level of consciousness and vital signs were monitored continuously by radiology nursing throughout the procedure under my direct supervision. FLUOROSCOPY TIME:  None COMPLICATIONS: None immediate. PROCEDURE: The procedure was explained to the patient. The risks and benefits of the procedure were discussed and the patient's questions were addressed. Informed consent was obtained from the patient. Left side of the neck was evaluated with ultrasound. Multiple large lymph nodes were identified. Suitable lymph node was identified in the left supraclavicular area. Skin was prepped with chlorhexidine and sterile field was created. Skin and soft tissues were anesthetized with 1% lidocaine. Using ultrasound guidance, 18 gauge core needle was directed into the targeted lymph node. Total of 6 core biopsies were obtained. Specimens placed in saline. Bandage placed over the puncture site. FINDINGS: Multiple large hypoechoic lymph nodes throughout the left supraclavicular region. Total of 6 core biopsies were obtained from one of the large supraclavicular lymph nodes. No significant bleeding or hematoma formation following the core biopsies. IMPRESSION: Ultrasound-guided core biopsy of a left supraclavicular lymph node. Electronically Signed   By: Markus Daft M.D.   On: 05/27/2019 15:44   CT Biopsy  Result Date: 05/27/2019 INDICATION: 19 year old with extensive lymphadenopathy and suspect lymphoma. Request for bone marrow biopsy. EXAM: CT GUIDED  BONE MARROW ASPIRATES AND BIOPSY Physician: Stephan Minister. Anselm Pancoast, MD MEDICATIONS: None. ANESTHESIA/SEDATION: Fentanyl 100 mcg IV; Versed 2.0 mg IV, Benadryl 25 mcg IV Moderate Sedation Time:  25 minutes The patient was continuously monitored during the procedure by the interventional radiology nurse under my direct supervision. COMPLICATIONS: None immediate. PROCEDURE: The procedure was explained to the patient. The risks and benefits of the procedure were discussed and the patient's questions were addressed. Informed consent was obtained from the patient. The patient was placed prone on CT table. Images of the pelvis were obtained. The right side of back was prepped and draped in sterile fashion. The skin and right posterior ilium were anesthetized with 1% lidocaine. 11 gauge bone needle was directed into the right ilium with CT guidance. Two aspirates and one core biopsy were obtained. Bandage placed over the puncture site. IMPRESSION: CT guided bone marrow aspiration and core biopsy. Electronically Signed   By: Markus Daft M.D.   On: 05/27/2019 15:31   CT BONE MARROW BIOPSY & ASPIRATION Order for BM BX  Result Date: 05/27/2019 INDICATION: 19 year old with extensive lymphadenopathy and suspect lymphoma. Request for bone marrow biopsy. EXAM: CT GUIDED BONE MARROW ASPIRATES AND BIOPSY Physician: Stephan Minister. Anselm Pancoast, MD MEDICATIONS: None. ANESTHESIA/SEDATION: Fentanyl 100 mcg IV; Versed 2.0 mg IV, Benadryl 25 mcg IV Moderate Sedation Time:  25 minutes The patient was continuously monitored during the procedure by the interventional radiology nurse under my direct supervision. COMPLICATIONS: None immediate. PROCEDURE: The procedure was explained to the patient. The risks and benefits of the procedure were discussed and the patient's questions were addressed. Informed consent was obtained from the patient. The patient was placed prone on CT table. Images of the pelvis were obtained. The right side of back was prepped and draped  in sterile  fashion. The skin and right posterior ilium were anesthetized with 1% lidocaine. 11 gauge bone needle was directed into the right ilium with CT guidance. Two aspirates and one core biopsy were obtained. Bandage placed over the puncture site. IMPRESSION: CT guided bone marrow aspiration and core biopsy. Electronically Signed   By: Markus Daft M.D.   On: 05/27/2019 15:31   ECHOCARDIOGRAM COMPLETE  Result Date: 05/25/2019   ECHOCARDIOGRAM REPORT   Patient Name:   YURIY CUI Date of Exam: 05/25/2019 Medical Rec #:  [id number]           Height:       72.0 in Accession #:    [id number]          Weight:       224.4 lb Date of Birth:  11-22-2000          BSA:          2.24 m Patient Age:    18 years            BP:           127/75 mmHg Patient Gender: M                   HR:           117 bpm. Exam Location:  Outpatient Procedure: 2D Echo, Cardiac Doppler, Color Doppler and Strain Analysis Indications:    Z51.11 Encounter for antineoplastic chemotheraphy  History:        Patient has no prior history of Echocardiogram examinations.  Sonographer:    Jonelle Sidle Dance Referring Phys: [id number] Swartz  1. Left ventricular ejection fraction, by visual estimation, is 50%. The left ventricle has low normal function. There is no left ventricular hypertrophy.  2. The average left ventricular global longitudinal strain is -14.1 %.  3. Left ventricular diastolic parameters are indeterminate.  4. Global right ventricle has normal systolic function.The right ventricular size is normal. No increase in right ventricular wall thickness.  5. Left atrial size was normal.  6. Right atrial size was normal.  7. Small pericardial effusion.  8. The pericardial effusion is circumferential.  9. The mitral valve is normal in structure. Trivial mitral valve regurgitation. No evidence of mitral stenosis. 10. The tricuspid valve is normal in structure. Tricuspid valve regurgitation is not demonstrated.  11. The aortic valve is normal in structure. Aortic valve regurgitation is not visualized. No evidence of aortic valve sclerosis or stenosis. 12. The pulmonic valve was normal in structure. Pulmonic valve regurgitation is trivial. 13. TR signal is inadequate for assessing pulmonary artery systolic pressure. 14. The inferior vena cava is normal in size with greater than 50% respiratory variability, suggesting right atrial pressure of 3 mmHg. 15. There is borderline dilatation of the aortic root measuring 39 mm. FINDINGS  Left Ventricle: Left ventricular ejection fraction, by visual estimation, is 50%. The left ventricle has low normal function. The average left ventricular global longitudinal strain is -14.1 %. The left ventricle is not well visualized. There is no left  ventricular hypertrophy. Left ventricular diastolic parameters are indeterminate. Normal left atrial pressure. Right Ventricle: The right ventricular size is normal. No increase in right ventricular wall thickness. Global RV systolic function is has normal systolic function. Left Atrium: Left atrial size was normal in size. Right Atrium: Right atrial size was normal in size Pericardium: A small pericardial effusion is present. The pericardial effusion is circumferential. There is no evidence of pericardial constriction. There is  no evidence of cardiac tamponade. Mitral Valve: The mitral valve is normal in structure. Trivial mitral valve regurgitation. No evidence of mitral valve stenosis by observation. Tricuspid Valve: The tricuspid valve is normal in structure. Tricuspid valve regurgitation is not demonstrated. Aortic Valve: The aortic valve is normal in structure. Aortic valve regurgitation is not visualized. The aortic valve is structurally normal, with no evidence of sclerosis or stenosis. Pulmonic Valve: The pulmonic valve was normal in structure. Pulmonic valve regurgitation is trivial. Pulmonic regurgitation is trivial. Aorta: Aortic  dilatation noted. There is borderline dilatation of the aortic root measuring 39 mm. Venous: The inferior vena cava is normal in size with greater than 50% respiratory variability, suggesting right atrial pressure of 3 mmHg. IAS/Shunts: No atrial level shunt detected by color flow Doppler. There is no evidence of a patent foramen ovale. No ventricular septal defect is seen or detected. There is no evidence of an atrial septal defect.  LEFT VENTRICLE PLAX 2D LVIDd:         3.90 cm  Diastology LVIDs:         2.40 cm  LV e' lateral:   14.20 cm/s LV PW:         1.20 cm  LV E/e' lateral: 6.0 LV IVS:        0.90 cm  LV e' medial:    13.20 cm/s LVOT diam:     2.20 cm  LV E/e' medial:  6.5 LV SV:         46 ml LV SV Index:   19.90    2D Longitudinal Strain LVOT Area:     3.80 cm 2D Strain GLS Avg:     -14.1 %  RIGHT VENTRICLE             IVC RV Basal diam:  2.50 cm     IVC diam: 1.95 cm RV S prime:     23.90 cm/s TAPSE (M-mode): 2.6 cm LEFT ATRIUM             Index       RIGHT ATRIUM           Index LA diam:        3.30 cm 1.47 cm/m  RA Area:     10.50 cm LA Vol (A2C):   43.6 ml 19.49 ml/m RA Volume:   17.00 ml  7.60 ml/m LA Vol (A4C):   56.8 ml 25.39 ml/m LA Biplane Vol: 50.8 ml 22.71 ml/m  AORTIC VALVE LVOT Vmax:   113.00 cm/s LVOT Vmean:  67.600 cm/s LVOT VTI:    0.151 m  AORTA Ao Root diam: 3.90 cm Ao Asc diam:  2.60 cm MITRAL VALVE MV Area (PHT): 4.36 cm            SHUNTS MV PHT:        50.46 msec          Systemic VTI:  0.15 m MV Decel Time: 174 msec            Systemic Diam: 2.20 cm MV E velocity: 85.55 cm/s 103 cm/s  Cherlynn Kaiser MD Electronically signed by Cherlynn Kaiser MD Signature Date/Time: 05/25/2019/3:35:18 PM    Final     ASSESSMENT AND PLAN:  This is a very pleasant 19 year old male diagnosed with Classical Hodgkin Lymphoma, Nodular Sclerosis subtype. He presented with bulky bilateral cervical lymphadenopathy, mediastinal lymphadenopathy, and mildly enlarged retroperitoneal periaortic  adenopathy and iliac adenopathy. He also presented with involvement of the spleen. He  was diagnosed in December 2020.  The patient is here today to start the first cycle of ABVD. I recommended for the patient to proceed with the treatment today as planned. He will receive a chemo education before his treatment today. I strongly recommend for the patient to fill his medication and starting it today including allopurinol as well as antiemetics. For the anemia he received 2 units of PRBCs transfusion last week. I advised him to continue on oral iron tablet 1-2 tablets every day. We will reschedule his Port-A-Cath to be performed in the next 1-2 weeks. He will come back for follow-up visit in 2 weeks for evaluation before starting day 15 of cycle #1. The patient was advised to call immediately if he has any concerning symptoms in the interval. The patient voices understanding of current disease status and treatment options and is in agreement with the current care plan.  All questions were answered. The patient knows to call the clinic with any problems, questions or concerns. We can certainly see the patient much sooner if necessary.  Disclaimer: This note was dictated with voice recognition software. Similar sounding words can inadvertently be transcribed and may not be corrected upon review.

## 2019-06-11 NOTE — Progress Notes (Signed)
Per Dr Julien Nordmann proceed with D1C1 AVBD with HGB 7.7, AST 85, and HR >100

## 2019-06-11 NOTE — Progress Notes (Signed)
Met with patient at registration to introduce myself as Arboriculturist and to offer available resources.  Discussed one-time $63 [employer], [employer] to assist with personal expenses while going through treatment. Advised him what is needed to apply. He verbalized understanding.  Advised of available financial assistance through LLS for young adults and pediatrics. Asked patient permission to contact him mom regarding application questions. He gave me permission to call her. Attempted to contact mom but received voicemail. Left my contact name and number for her to return my call to complete application.  Asked patient if he was in need of transportation while explaining grant funds and gas cards. He states he was but figured it was not a free service. Advised him the service is free and asked if I could reach out to transportation coordinator to assist him. He gave me permission. Sent staff message to Di Kindle C to reach out to him regarding transportation concern.   Gave patient my card for any additional financial questions or concerns and wrote down information needed for grant.

## 2019-06-12 ENCOUNTER — Encounter: Payer: Self-pay | Admitting: *Deleted

## 2019-06-12 ENCOUNTER — Telehealth: Payer: Self-pay | Admitting: *Deleted

## 2019-06-12 NOTE — Progress Notes (Signed)
Terry Quinn  Clinical Social Quinn received message from medical oncology RN, patient was unable to purchase medications from pharmacy due to cost.  CSW contacted patient by phone to discuss situation.  Terry Quinn reported he could not afford the additional medications.  CSW strongly recommended patient pick up medications today- CSW team offered to provide [employer] card to cover cost of medications and bus pass to cover cost of transportation.  Patient verbalized importance of taking medications but feels he'll "be okay" over the weekend.  CSW reiterated importance of following treatment recommendations, signing up for [place], applying for Medicaid, and connecting with resources to support him on cancer journey.  CSW will continue to follow  Patient throughout cancer experience.   Gwinda Maine, LCSW  Clinical Social Worker [facility]

## 2019-06-24 ENCOUNTER — Other Ambulatory Visit: Payer: Self-pay

## 2019-06-24 ENCOUNTER — Ambulatory Visit: Payer: Self-pay | Admitting: Nutrition

## 2019-06-24 ENCOUNTER — Encounter: Payer: Self-pay | Admitting: Internal Medicine

## 2019-06-24 ENCOUNTER — Encounter: Payer: Self-pay | Admitting: Licensed Clinical Social Worker

## 2019-06-24 ENCOUNTER — Other Ambulatory Visit: Payer: Self-pay | Admitting: Medical Oncology

## 2019-06-24 ENCOUNTER — Inpatient Hospital Stay: Payer: Medicaid Other

## 2019-06-24 ENCOUNTER — Inpatient Hospital Stay (HOSPITAL_BASED_OUTPATIENT_CLINIC_OR_DEPARTMENT_OTHER): Payer: Medicaid Other | Admitting: Internal Medicine

## 2019-06-24 ENCOUNTER — Encounter: Payer: Self-pay | Admitting: *Deleted

## 2019-06-24 VITALS — BP 116/53 | HR 56 | Temp 97.8°F | Resp 17 | Ht 72.0 in | Wt 225.2 lb

## 2019-06-24 DIAGNOSIS — C8111 Nodular sclerosis classical Hodgkin lymphoma, lymph nodes of head, face, and neck: Secondary | ICD-10-CM

## 2019-06-24 DIAGNOSIS — Z5111 Encounter for antineoplastic chemotherapy: Secondary | ICD-10-CM | POA: Diagnosis not present

## 2019-06-24 LAB — CMP (CANCER CENTER ONLY)
ALT: 11 U/L (ref 0–44)
AST: 10 U/L — ABNORMAL LOW (ref 15–41)
Albumin: 2.7 g/dL — ABNORMAL LOW (ref 3.5–5.0)
Alkaline Phosphatase: 112 U/L (ref 38–126)
Anion gap: 9 (ref 5–15)
BUN: 8 mg/dL (ref 6–20)
CO2: 28 mmol/L (ref 22–32)
Calcium: 8.3 mg/dL — ABNORMAL LOW (ref 8.9–10.3)
Chloride: 102 mmol/L (ref 98–111)
Creatinine: 0.68 mg/dL (ref 0.61–1.24)
GFR, Est AFR Am: >60 mL/min (ref >60)
GFR, Estimated: >60 mL/min (ref >60)
Glucose, Bld: 83 mg/dL (ref 70–99)
Potassium: 3.7 mmol/L (ref 3.5–5.1)
Sodium: 139 mmol/L (ref 135–145)
Total Bilirubin: <0.2 mg/dL — ABNORMAL LOW (ref 0.3–1.2)
Total Protein: 7.8 g/dL (ref 6.5–8.1)

## 2019-06-24 LAB — CBC WITH DIFFERENTIAL (CANCER CENTER ONLY)
Abs Immature Granulocytes: 0.02 10*3/uL (ref 0.00–0.07)
Basophils Absolute: 0 10*3/uL (ref 0.0–0.1)
Basophils Relative: 0 %
Eosinophils Absolute: 0 10*3/uL (ref 0.0–0.5)
Eosinophils Relative: 1 %
HCT: 27.7 % — ABNORMAL LOW (ref 39.0–52.0)
Hemoglobin: 8.2 g/dL — ABNORMAL LOW (ref 13.0–17.0)
Immature Granulocytes: 1 %
Lymphocytes Relative: 32 %
Lymphs Abs: 1.2 10*3/uL (ref 0.7–4.0)
MCH: 24.1 pg — ABNORMAL LOW (ref 26.0–34.0)
MCHC: 29.6 g/dL — ABNORMAL LOW (ref 30.0–36.0)
MCV: 81.5 fL (ref 80.0–100.0)
Monocytes Absolute: 0.4 10*3/uL (ref 0.1–1.0)
Monocytes Relative: 11 %
Neutro Abs: 2 10*3/uL (ref 1.7–7.7)
Neutrophils Relative %: 55 %
Platelet Count: 305 10*3/uL (ref 150–400)
RBC: 3.4 MIL/uL — ABNORMAL LOW (ref 4.22–5.81)
RDW: 20 % — ABNORMAL HIGH (ref 11.5–15.5)
WBC Count: 3.6 10*3/uL — ABNORMAL LOW (ref 4.0–10.5)
nRBC: 0 % (ref 0.0–0.2)

## 2019-06-24 MED ORDER — DEXAMETHASONE SODIUM PHOSPHATE 10 MG/ML IJ SOLN
INTRAMUSCULAR | Status: AC
  Filled 2019-06-24: qty 1

## 2019-06-24 MED ORDER — VINBLASTINE SULFATE CHEMO INJECTION 1 MG/ML
5.8500 mg/m2 | Freq: Once | INTRAVENOUS | Status: AC
  Administered 2019-06-24: 12:00:00 13 mg via INTRAVENOUS
  Filled 2019-06-24: qty 13

## 2019-06-24 MED ORDER — DEXAMETHASONE SODIUM PHOSPHATE 10 MG/ML IJ SOLN
10.0000 mg | Freq: Once | INTRAMUSCULAR | Status: AC
  Administered 2019-06-24: 11:00:00 10 mg via INTRAVENOUS

## 2019-06-24 MED ORDER — DOXORUBICIN HCL CHEMO IV INJECTION 2 MG/ML
25.0000 mg/m2 | Freq: Once | INTRAVENOUS | Status: AC
  Administered 2019-06-24: 56 mg via INTRAVENOUS
  Filled 2019-06-24: qty 28

## 2019-06-24 MED ORDER — PALONOSETRON HCL INJECTION 0.25 MG/5ML
0.2500 mg | Freq: Once | INTRAVENOUS | Status: AC
  Administered 2019-06-24: 0.25 mg via INTRAVENOUS

## 2019-06-24 MED ORDER — SODIUM CHLORIDE 0.9 % IV SOLN
375.0000 mg/m2 | Freq: Once | INTRAVENOUS | Status: AC
  Administered 2019-06-24: 13:00:00 830 mg via INTRAVENOUS
  Filled 2019-06-24: qty 83

## 2019-06-24 MED ORDER — PALONOSETRON HCL INJECTION 0.25 MG/5ML
INTRAVENOUS | Status: AC
  Filled 2019-06-24: qty 5

## 2019-06-24 MED ORDER — SODIUM CHLORIDE 0.9 % IV SOLN
10.0000 [IU]/m2 | Freq: Once | INTRAVENOUS | Status: AC
  Administered 2019-06-24: 13:00:00 22 [IU] via INTRAVENOUS
  Filled 2019-06-24: qty 7.33

## 2019-06-24 MED ORDER — SODIUM CHLORIDE 0.9 % IV SOLN
Freq: Once | INTRAVENOUS | Status: AC
  Filled 2019-06-24: qty 250

## 2019-06-24 MED ORDER — SODIUM CHLORIDE 0.9 % IV SOLN
150.0000 mg | Freq: Once | INTRAVENOUS | Status: AC
  Administered 2019-06-24: 11:00:00 150 mg via INTRAVENOUS
  Filled 2019-06-24: qty 150

## 2019-06-24 NOTE — Progress Notes (Signed)
A user error has taken place: encounter opened in error, closed for administrative reasons.

## 2019-06-24 NOTE — Progress Notes (Signed)
err

## 2019-06-24 NOTE — Patient Instructions (Signed)
[facility] Discharge Instructions for Patients Receiving Chemotherapy  Today you received the following chemotherapy agents:  Adriamycin, Vinblastine, Bleomycin and Dacarbazine.  To help prevent nausea and vomiting after your treatment, we encourage you to take your nausea medication as prescribed.   If you develop nausea and vomiting that is not controlled by your nausea medication, call the clinic.   BELOW ARE SYMPTOMS THAT SHOULD BE REPORTED IMMEDIATELY:  *FEVER GREATER THAN 100.5 F  *CHILLS WITH OR WITHOUT FEVER  NAUSEA AND VOMITING THAT IS NOT CONTROLLED WITH YOUR NAUSEA MEDICATION  *UNUSUAL SHORTNESS OF BREATH  *UNUSUAL BRUISING OR BLEEDING  TENDERNESS IN MOUTH AND THROAT WITH OR WITHOUT PRESENCE OF ULCERS  *URINARY PROBLEMS  *BOWEL PROBLEMS  UNUSUAL RASH Items with * indicate a potential emergency and should be followed up as soon as possible.  Feel free to call the clinic should you have any questions or concerns. The clinic phone number is [phone].  Please show the [place] at check-in to the Emergency Department and triage nurse.

## 2019-06-24 NOTE — Progress Notes (Signed)
Met with patient in lobby to obtain signature for grant.  Patient approved for one-time $48 [place] to assist with personal expenses such as medication, gift cards, and household expenses. He has a copy of the approval letter as well as the expense sheet along with the Outpatient pharmacy information.  Asked if he was utilizing the transportation program as we discussed and he said that is how he got here today.  Advised of other assistance we discussed through LLS requires accurate income information to submit, therefore cannot apply without specific information for household.  Asked patient if he updated his ROI form to have his mentor added and he said no but would like to. Gave him the form to complete and had scanned by registration.   He received a gift card today and has my card for any additional financial questions or concerns.

## 2019-06-24 NOTE — Progress Notes (Signed)
Patient was referred to nutrition by Education officer, museum.  19 year old male diagnosed with Hodgkin's lymphoma.  He is a patient of Dr. Julien Nordmann.  He is receiving ABVD every 28 days.  Past medical history includes depression, defiant disorder, and obesity.  Medications include Zofran and Compazine.  Labs include albumin 2.7.  Height: 6 feet 0 inches. Weight: 225.2 pounds January 20. Usual body weight: 230 pounds per patient. BMI: 30.54.  Patient reports ongoing nausea and vomiting.  States that improves some when he takes his nausea medicine but it does not completely eliminate the nausea. Reports decreased appetite. Denies other nutrition impact symptoms. He does not want to drink oral nutrition supplements at this time.  Nutrition diagnosis: Food and nutrition related knowledge deficit related to Hodgkin's lymphoma and associated treatments as evidenced by no prior need for nutrition related information.  Intervention: Patient was educated to continue small frequent meals and snacks utilizing high-calorie, high-protein foods and to strive for weight maintenance. I provided fact sheets. Also educated patient on strategies for eating with nausea and vomiting. Provided contact information. Questions were answered and teach back method used.  Monitoring, evaluation, goals: Patient will tolerate adequate calories and protein for weight maintenance.  Next visit: To be scheduled with treatment as needed.  **Disclaimer: This note was dictated with voice recognition software. Similar sounding words can inadvertently be transcribed and this note may contain transcription errors which may not have been corrected upon publication of note.**

## 2019-06-24 NOTE — Progress Notes (Signed)
[place] Work  Clinical Social Work met with patient in infusion room to follow up from previous visit.  CSW explored patient's sources of support- he reported he's been spending a lot of time with his brother and recent connection with a mentor Elvera Lennox [phone]).  Patient shared he's had no difficulty obtaining medications.  CSW explored areas of concern and patient's emotional health.  Patient plans to follow up with CSW as needed. CSW provided [employer] card to assist with food.  Gwinda Maine, LCSW  Clinical Social Worker [facility]

## 2019-06-24 NOTE — Progress Notes (Signed)
[place] [phone]   [phone]  OFFICE PROGRESS NOTE  Patient, No Pcp Per No address on file  DIAGNOSIS: Classical Hodgkin Lymphoma, Nodular Sclerosis subtype. He presented with bulky bilateral cervical lymphadenopathy, mediastinal lymphadenopathy, and mildly enlarged retroperitoneal periaortic adenopathy and iliac adenopathy. He also presented with involvement of the spleen. He was diagnosed in December 2020.   PRIOR THERAPY: None   CURRENT THERAPY: Systemic chemotherapy with doxorubicin, dacarbazine, vinblastine, and bleomycin on days 1 and 15 every 4 weeks.  First dose expected on January 7th, 2021.  Status post day 1 of cycle #1.  INTERVAL HISTORY: Terry Quinn 19 y.o. male returns to the clinic today for follow-up visit.  The patient is feeling much better today.  He gained around 10 pounds since his last visit.  He noticed decrease in the size of the neck lymphadenopathy after the first dose of his treatment.  He has mild nausea after the treatment.  He denied having any current chest pain, shortness of breath, cough or hemoptysis.  He has no fever or chills.  He is here today for evaluation before starting day 15 of the first cycle.  MEDICAL HISTORY:No past medical history on file.  ALLERGIES:  has No Known Allergies.  MEDICATIONS:  Current Outpatient Medications  Medication Sig Dispense Refill   allopurinol (ZYLOPRIM) 100 MG tablet Take 1 tablet (100 mg total) by mouth 2 (two) times daily. 60 tablet 2   lidocaine-prilocaine (EMLA) cream Apply 1 application topically as needed. 30 g 1   ondansetron (ZOFRAN ODT) 4 MG disintegrating tablet 14m ODT q4 hours prn nausea/vomit (Patient not taking: Reported on 05/18/2019) 10 tablet 0   prochlorperazine (COMPAZINE) 10 MG tablet Take 1 tablet (10 mg total) by mouth every 6 (six) hours as needed for nausea or vomiting. 30 tablet 0   No current facility-administered medications for  this visit.    SURGICAL HISTORY:  Past Surgical History:  Procedure Laterality Date   IR [place] BX ASP/DRAIN  05/27/2019    REVIEW OF SYSTEMS:  A comprehensive review of systems was negative except for: Constitutional: positive for fatigue Gastrointestinal: positive for nausea   PHYSICAL EXAMINATION: General appearance: alert, cooperative, fatigued and no distress Head: Normocephalic, without obvious abnormality, atraumatic Neck: marked anterior cervical adenopathy, no carotid bruit, no JVD, supple, symmetrical, trachea midline and thyroid not enlarged, symmetric, no tenderness/mass/nodules Lymph nodes: Cervical adenopathy: moderate bilateral cervical and supraclavicular lymphadenopathy Resp: clear to auscultation bilaterally Back: symmetric, no curvature. ROM normal. No CVA tenderness. Cardio: regular rate and rhythm, S1, S2 normal, no murmur, click, rub or gallop GI: soft, non-tender; bowel sounds normal; no masses,  no organomegaly Extremities: extremities normal, atraumatic, no cyanosis or edema  ECOG PERFORMANCE STATUS: 1 - Symptomatic but completely ambulatory  Blood pressure (!) 116/53, pulse (!) 56, temperature 97.8 F (36.6 C), temperature source Oral, resp. rate 17, height 6' (1.829 m), weight 225 lb 3.2 oz (102.2 kg), SpO2 100 %.  LABORATORY DATA: Lab Results  Component Value Date   WBC 3.6 (L) 06/24/2019   HGB 8.2 (L) 06/24/2019   HCT 27.7 (L) 06/24/2019   MCV 81.5 06/24/2019   PLT 305 06/24/2019      Chemistry      Component Value Date/Time   NA 135 06/11/2019 1118   K 3.8 06/11/2019 1118   CL 96 (L) 06/11/2019 1118   CO2 27 06/11/2019 1118   BUN 6 06/11/2019 1118   CREATININE 0.68  06/11/2019 1118      Component Value Date/Time   CALCIUM 8.7 (L) 06/11/2019 1118   ALKPHOS 328 (H) 06/11/2019 1118   AST 85 (H) 06/11/2019 1118   ALT 80 (H) 06/11/2019 1118   BILITOT 0.7 06/11/2019 1118       RADIOGRAPHIC STUDIES: IR US Guide Bx Asp/Drain  Result  Date: 05/27/2019 INDICATION: 19 year old with extensive lymphadenopathy and suspect lymphoma. Patient needs a tissue diagnosis. Ultrasound-guided lymph node biopsy was performed immediately following the CT-guided bone marrow biopsy. EXAM: ULTRASOUND-GUIDED CORE BIOPSY OF LEFT SUPRACLAVICULAR LYMPH NODE MEDICATIONS: 1 mg Versed ANESTHESIA/SEDATION: Moderate Sedation Time: 10 minutes. The sedation time was a continuation of the sedation from the CT-guided bone marrow biopsy. The patient's level of consciousness and vital signs were monitored continuously by radiology nursing throughout the procedure under my direct supervision. FLUOROSCOPY TIME:  None COMPLICATIONS: None immediate. PROCEDURE: The procedure was explained to the patient. The risks and benefits of the procedure were discussed and the patient's questions were addressed. Informed consent was obtained from the patient. Left side of the neck was evaluated with ultrasound. Multiple large lymph nodes were identified. Suitable lymph node was identified in the left supraclavicular area. Skin was prepped with chlorhexidine and sterile field was created. Skin and soft tissues were anesthetized with 1% lidocaine. Using ultrasound guidance, 18 gauge core needle was directed into the targeted lymph node. Total of 6 core biopsies were obtained. Specimens placed in saline. Bandage placed over the puncture site. FINDINGS: Multiple large hypoechoic lymph nodes throughout the left supraclavicular region. Total of 6 core biopsies were obtained from one of the large supraclavicular lymph nodes. No significant bleeding or hematoma formation following the core biopsies. IMPRESSION: Ultrasound-guided core biopsy of a left supraclavicular lymph node. Electronically Signed   By: Markus Daft M.D.   On: 05/27/2019 15:44   CT Biopsy  Result Date: 05/27/2019 INDICATION: 19 year old with extensive lymphadenopathy and suspect lymphoma. Request for bone marrow biopsy. EXAM: CT  GUIDED BONE MARROW ASPIRATES AND BIOPSY Physician: Stephan Minister. Anselm Pancoast, MD MEDICATIONS: None. ANESTHESIA/SEDATION: Fentanyl 100 mcg IV; Versed 2.0 mg IV, Benadryl 25 mcg IV Moderate Sedation Time:  25 minutes The patient was continuously monitored during the procedure by the interventional radiology nurse under my direct supervision. COMPLICATIONS: None immediate. PROCEDURE: The procedure was explained to the patient. The risks and benefits of the procedure were discussed and the patient's questions were addressed. Informed consent was obtained from the patient. The patient was placed prone on CT table. Images of the pelvis were obtained. The right side of back was prepped and draped in sterile fashion. The skin and right posterior ilium were anesthetized with 1% lidocaine. 11 gauge bone needle was directed into the right ilium with CT guidance. Two aspirates and one core biopsy were obtained. Bandage placed over the puncture site. IMPRESSION: CT guided bone marrow aspiration and core biopsy. Electronically Signed   By: Markus Daft M.D.   On: 05/27/2019 15:31   CT BONE MARROW BIOPSY & ASPIRATION Order for BM BX  Result Date: 05/27/2019 INDICATION: 19 year old with extensive lymphadenopathy and suspect lymphoma. Request for bone marrow biopsy. EXAM: CT GUIDED BONE MARROW ASPIRATES AND BIOPSY Physician: Stephan Minister. Anselm Pancoast, MD MEDICATIONS: None. ANESTHESIA/SEDATION: Fentanyl 100 mcg IV; Versed 2.0 mg IV, Benadryl 25 mcg IV Moderate Sedation Time:  25 minutes The patient was continuously monitored during the procedure by the interventional radiology nurse under my direct supervision. COMPLICATIONS: None immediate. PROCEDURE: The procedure was explained to the patient. The  risks and benefits of the procedure were discussed and the patient's questions were addressed. Informed consent was obtained from the patient. The patient was placed prone on CT table. Images of the pelvis were obtained. The right side of back was prepped and  draped in sterile fashion. The skin and right posterior ilium were anesthetized with 1% lidocaine. 11 gauge bone needle was directed into the right ilium with CT guidance. Two aspirates and one core biopsy were obtained. Bandage placed over the puncture site. IMPRESSION: CT guided bone marrow aspiration and core biopsy. Electronically Signed   By: Markus Daft M.D.   On: 05/27/2019 15:31   ECHOCARDIOGRAM COMPLETE  Result Date: 05/25/2019   ECHOCARDIOGRAM REPORT   Patient Name:   Terry Quinn Date of Exam: 05/25/2019 Medical Rec #:  [id number]           Height:       72.0 in Accession #:    [id number]          Weight:       224.4 lb Date of Birth:  2001-04-01          BSA:          2.24 m Patient Age:    18 years            BP:           127/75 mmHg Patient Gender: M                   HR:           117 bpm. Exam Location:  Outpatient Procedure: 2D Echo, Cardiac Doppler, Color Doppler and Strain Analysis Indications:    Z51.11 Encounter for antineoplastic chemotheraphy  History:        Patient has no prior history of Echocardiogram examinations.  Sonographer:    Jonelle Sidle Dance Referring Phys: [id number]  1. Left ventricular ejection fraction, by visual estimation, is 50%. The left ventricle has low normal function. There is no left ventricular hypertrophy.  2. The average left ventricular global longitudinal strain is -14.1 %.  3. Left ventricular diastolic parameters are indeterminate.  4. Global right ventricle has normal systolic function.The right ventricular size is normal. No increase in right ventricular wall thickness.  5. Left atrial size was normal.  6. Right atrial size was normal.  7. Small pericardial effusion.  8. The pericardial effusion is circumferential.  9. The mitral valve is normal in structure. Trivial mitral valve regurgitation. No evidence of mitral stenosis. 10. The tricuspid valve is normal in structure. Tricuspid valve regurgitation is not  demonstrated. 11. The aortic valve is normal in structure. Aortic valve regurgitation is not visualized. No evidence of aortic valve sclerosis or stenosis. 12. The pulmonic valve was normal in structure. Pulmonic valve regurgitation is trivial. 13. TR signal is inadequate for assessing pulmonary artery systolic pressure. 14. The inferior vena cava is normal in size with greater than 50% respiratory variability, suggesting right atrial pressure of 3 mmHg. 15. There is borderline dilatation of the aortic root measuring 39 mm. FINDINGS  Left Ventricle: Left ventricular ejection fraction, by visual estimation, is 50%. The left ventricle has low normal function. The average left ventricular global longitudinal strain is -14.1 %. The left ventricle is not well visualized. There is no left  ventricular hypertrophy. Left ventricular diastolic parameters are indeterminate. Normal left atrial pressure. Right Ventricle: The right ventricular size is normal. No increase in right ventricular wall thickness. Global  RV systolic function is has normal systolic function. Left Atrium: Left atrial size was normal in size. Right Atrium: Right atrial size was normal in size Pericardium: A small pericardial effusion is present. The pericardial effusion is circumferential. There is no evidence of pericardial constriction. There is no evidence of cardiac tamponade. Mitral Valve: The mitral valve is normal in structure. Trivial mitral valve regurgitation. No evidence of mitral valve stenosis by observation. Tricuspid Valve: The tricuspid valve is normal in structure. Tricuspid valve regurgitation is not demonstrated. Aortic Valve: The aortic valve is normal in structure. Aortic valve regurgitation is not visualized. The aortic valve is structurally normal, with no evidence of sclerosis or stenosis. Pulmonic Valve: The pulmonic valve was normal in structure. Pulmonic valve regurgitation is trivial. Pulmonic regurgitation is trivial. Aorta:  Aortic dilatation noted. There is borderline dilatation of the aortic root measuring 39 mm. Venous: The inferior vena cava is normal in size with greater than 50% respiratory variability, suggesting right atrial pressure of 3 mmHg. IAS/Shunts: No atrial level shunt detected by color flow Doppler. There is no evidence of a patent foramen ovale. No ventricular septal defect is seen or detected. There is no evidence of an atrial septal defect.  LEFT VENTRICLE PLAX 2D LVIDd:         3.90 cm  Diastology LVIDs:         2.40 cm  LV e' lateral:   14.20 cm/s LV PW:         1.20 cm  LV E/e' lateral: 6.0 LV IVS:        0.90 cm  LV e' medial:    13.20 cm/s LVOT diam:     2.20 cm  LV E/e' medial:  6.5 LV SV:         46 ml LV SV Index:   19.90    2D Longitudinal Strain LVOT Area:     3.80 cm 2D Strain GLS Avg:     -14.1 %  RIGHT VENTRICLE             IVC RV Basal diam:  2.50 cm     IVC diam: 1.95 cm RV S prime:     23.90 cm/s TAPSE (M-mode): 2.6 cm LEFT ATRIUM             Index       RIGHT ATRIUM           Index LA diam:        3.30 cm 1.47 cm/m  RA Area:     10.50 cm LA Vol (A2C):   43.6 ml 19.49 ml/m RA Volume:   17.00 ml  7.60 ml/m LA Vol (A4C):   56.8 ml 25.39 ml/m LA Biplane Vol: 50.8 ml 22.71 ml/m  AORTIC VALVE LVOT Vmax:   113.00 cm/s LVOT Vmean:  67.600 cm/s LVOT VTI:    0.151 m  AORTA Ao Root diam: 3.90 cm Ao Asc diam:  2.60 cm MITRAL VALVE MV Area (PHT): 4.36 cm            SHUNTS MV PHT:        50.46 msec          Systemic VTI:  0.15 m MV Decel Time: 174 msec            Systemic Diam: 2.20 cm MV E velocity: 85.55 cm/s 103 cm/s  Cherlynn Kaiser MD Electronically signed by Cherlynn Kaiser MD Signature Date/Time: 05/25/2019/3:35:18 PM    Final     ASSESSMENT AND  PLAN:  This is a very pleasant 19 year old male diagnosed with Classical Hodgkin Lymphoma, Nodular Sclerosis subtype. He presented with bulky bilateral cervical lymphadenopathy, mediastinal lymphadenopathy, and mildly enlarged retroperitoneal periaortic  adenopathy and iliac adenopathy. He also presented with involvement of the spleen. He was diagnosed in December 2020.  The patient is currently undergoing systemic chemotherapy with ABVD status post day 1 of cycle #1.  He tolerated the first dose of his treatment fairly well with no concerning adverse effect except for mild nausea. I recommended for the patient to proceed with day 15 of cycle #1 today as planned. We will arrange for the patient to have Port-A-Cath placed next week. He will come back for follow-up visit in 2 weeks for evaluation before starting day 1 of cycle #2. He was advised to call immediately if he has any concerning symptoms in the interval. The patient voices understanding of current disease status and treatment options and is in agreement with the current care plan.  All questions were answered. The patient knows to call the clinic with any problems, questions or concerns. We can certainly see the patient much sooner if necessary.  Disclaimer: This note was dictated with voice recognition software. Similar sounding words can inadvertently be transcribed and may not be corrected upon review.

## 2019-06-26 ENCOUNTER — Telehealth: Payer: Self-pay | Admitting: Internal Medicine

## 2019-06-26 NOTE — Telephone Encounter (Signed)
Scheduled per los. Called and spoke with patient. Confirmed added appts  

## 2019-06-28 ENCOUNTER — Other Ambulatory Visit: Payer: Self-pay | Admitting: Radiology

## 2019-06-29 ENCOUNTER — Other Ambulatory Visit: Payer: Self-pay | Admitting: Radiology

## 2019-06-30 ENCOUNTER — Encounter (HOSPITAL_COMMUNITY): Payer: Self-pay

## 2019-06-30 ENCOUNTER — Other Ambulatory Visit: Payer: Self-pay

## 2019-06-30 ENCOUNTER — Ambulatory Visit (HOSPITAL_COMMUNITY)
Admission: RE | Admit: 2019-06-30 | Discharge: 2019-06-30 | Disposition: A | Discharge status: Home / Self Care | Payer: Medicaid Other | Source: Ambulatory Visit | Attending: Medical | Admitting: Medical

## 2019-06-30 DIAGNOSIS — C8111 Nodular sclerosis classical Hodgkin lymphoma, lymph nodes of head, face, and neck: Secondary | ICD-10-CM | POA: Diagnosis present

## 2019-06-30 HISTORY — PX: IR IMAGING GUIDED PORT INSERTION: IMG5740

## 2019-06-30 LAB — CBC WITH DIFFERENTIAL/PLATELET
Abs Immature Granulocytes: 0.03 10*3/uL (ref 0.00–0.07)
Basophils Absolute: 0 10*3/uL (ref 0.0–0.1)
Basophils Relative: 1 %
Eosinophils Absolute: 0.1 10*3/uL (ref 0.0–0.5)
Eosinophils Relative: 3 %
HCT: 30.7 % — ABNORMAL LOW (ref 39.0–52.0)
Hemoglobin: 9.2 g/dL — ABNORMAL LOW (ref 13.0–17.0)
Immature Granulocytes: 1 %
Lymphocytes Relative: 41 %
Lymphs Abs: 1.6 10*3/uL (ref 0.7–4.0)
MCH: 24.9 pg — ABNORMAL LOW (ref 26.0–34.0)
MCHC: 30 g/dL (ref 30.0–36.0)
MCV: 83.2 fL (ref 80.0–100.0)
Monocytes Absolute: 0.1 10*3/uL (ref 0.1–1.0)
Monocytes Relative: 3 %
Neutro Abs: 2 10*3/uL (ref 1.7–7.7)
Neutrophils Relative %: 51 %
Platelets: 350 10*3/uL (ref 150–400)
RBC: 3.69 MIL/uL — ABNORMAL LOW (ref 4.22–5.81)
RDW: 20.8 % — ABNORMAL HIGH (ref 11.5–15.5)
WBC: 3.9 10*3/uL — ABNORMAL LOW (ref 4.0–10.5)
nRBC: 0 % (ref 0.0–0.2)

## 2019-06-30 LAB — PROTIME-INR
INR: 1 (ref 0.8–1.2)
Prothrombin Time: 13.1 seconds (ref 11.4–15.2)

## 2019-06-30 MED ORDER — LIDOCAINE-EPINEPHRINE (PF) 1 %-1:200000 IJ SOLN
INTRAMUSCULAR | Status: AC | PRN
  Administered 2019-06-30: 20 mL

## 2019-06-30 MED ORDER — LIDOCAINE-EPINEPHRINE 1 %-1:100000 IJ SOLN
INTRAMUSCULAR | Status: AC
  Filled 2019-06-30: qty 1

## 2019-06-30 MED ORDER — SODIUM CHLORIDE 0.9 % IV SOLN: INTRAVENOUS | Status: DC

## 2019-06-30 MED ORDER — HEPARIN SOD (PORK) LOCK FLUSH 100 UNIT/ML IV SOLN
INTRAVENOUS | Status: AC
  Filled 2019-06-30: qty 5

## 2019-06-30 MED ORDER — CEFAZOLIN SODIUM-DEXTROSE 2-4 GM/100ML-% IV SOLN
INTRAVENOUS | Status: AC
  Administered 2019-06-30: 2 g via INTRAVENOUS
  Filled 2019-06-30: qty 100

## 2019-06-30 MED ORDER — FENTANYL CITRATE (PF) 100 MCG/2ML IJ SOLN
INTRAMUSCULAR | Status: AC
  Filled 2019-06-30: qty 2

## 2019-06-30 MED ORDER — MIDAZOLAM HCL 2 MG/2ML IJ SOLN
INTRAMUSCULAR | Status: AC | PRN
  Administered 2019-06-30 (×3): 1 mg via INTRAVENOUS

## 2019-06-30 MED ORDER — CEFAZOLIN SODIUM-DEXTROSE 2-4 GM/100ML-% IV SOLN: 2.0000 g | INTRAVENOUS | Status: AC

## 2019-06-30 MED ORDER — MIDAZOLAM HCL 2 MG/2ML IJ SOLN
INTRAMUSCULAR | Status: AC
  Filled 2019-06-30: qty 4

## 2019-06-30 MED ORDER — FENTANYL CITRATE (PF) 100 MCG/2ML IJ SOLN
INTRAMUSCULAR | Status: AC | PRN
  Administered 2019-06-30 (×2): 50 ug via INTRAVENOUS

## 2019-06-30 NOTE — H&P (Signed)
Referring Physician(s): Mohamed,M  Supervising Physician: Arne Cleveland  Patient Status:  [facility] OP  Chief Complaint:  "I'm getting a port a cath"  Subjective: Patient familiar to IR service from left supraclavicular lymph node biopsy and bone marrow biopsy on 05/27/2019.  He has a history of recently diagnosed classical Hodgkin's lymphoma, nodular sclerosis subtype and presents again today for Port-A-Cath placement for chemotherapy.  He currently denies fever, headache, chest pain, dyspnea, cough, back pain, or bleeding.  He does have some left-sided abdominal discomfort and occasional nausea /vomiting.    History reviewed. No pertinent past medical history. Past Surgical History:  Procedure Laterality Date  . IR US GUIDE BX ASP/DRAIN  05/27/2019      Allergies: Patient has no known allergies.  Medications: Prior to Admission medications   Medication Sig Start Date End Date Taking? Authorizing Provider  allopurinol (ZYLOPRIM) 100 MG tablet Take 1 tablet (100 mg total) by mouth 2 (two) times daily. 06/11/19   Curt Bears, MD  lidocaine-prilocaine (EMLA) cream Apply 1 application topically as needed. 06/11/19   Curt Bears, MD  ondansetron (ZOFRAN ODT) 4 MG disintegrating tablet 23m ODT q4 hours prn nausea/vomit Patient not taking: Reported on 05/18/2019 05/11/19   FJacqlyn Larsen PA-C  prochlorperazine (COMPAZINE) 10 MG tablet Take 1 tablet (10 mg total) by mouth every 6 (six) hours as needed for nausea or vomiting. 06/11/19   MCurt Bears MD     Vital Signs: Blood pressure 136/70, heart rate 60, respirations 18, temperature 98.6, O2 sat 100% room air   Physical Exam awake, alert.  Chest clear to auscultation bilaterally.  Heart with regular rate and rhythm.  Abdomen soft, positive bowel sounds, mildly tender left upper quadrant to palpation.  Extremities with full range of motion.  Imaging: No results found.  Labs:  CBC: Recent Labs    05/27/19 1021  06/02/19 1051 06/11/19 1118 06/24/19 0844  WBC 31.8* 32.7* 32.5* 3.6*  HGB 7.6* 7.6* 7.7* 8.2*  HCT 25.3* 25.2* 25.3* 27.7*  PLT 400 449* 347 305    COAGS: Recent Labs    05/27/19 1021  INR 1.4*    BMP: Recent Labs    05/18/19 1044 06/02/19 1051 06/11/19 1118 06/24/19 0844  NA 134* 135 135 139  K 4.5 3.8 3.8 3.7  CL 95* 95* 96* 102  CO2 '28 24 27 28  ' GLUCOSE 95 103* 92 83  BUN '7 6 6 8  ' CALCIUM 9.3 9.2 8.7* 8.3*  CREATININE 0.71 0.73 0.68 0.68  GFRNONAA >60 >60 >60 >60  GFRAA >60 >60 >60 >60    LIVER FUNCTION TESTS: Recent Labs    05/18/19 1044 06/02/19 1051 06/11/19 1118 06/24/19 0844  BILITOT 0.5 0.7 0.7 <0.2*  AST 47* 38 85* 10*  ALT 49* 57* 80* 11  ALKPHOS 248* 303* 328* 112  PROT 9.1* 9.8* 9.2* 7.8  ALBUMIN 2.4* 2.4* 2.2* 2.7*    Assessment and Plan: Pt with history of recently diagnosed classical Hodgkin's lymphoma, nodular sclerosis subtype ; presents  today for Port-A-Cath placement for chemotherapy.  Risks and benefits of image guided port-a-catheter placement was discussed with the patient including, but not limited to bleeding, infection, pneumothorax, or fibrin sheath development and need for additional procedures.  All of the patient's questions were answered, patient is agreeable to proceed. Consent signed and in chart.     Electronically Signed: D. KRowe Robert PA-C 06/30/2019, 12:52 PM   I spent a total of 25 minutes at the the patient's  bedside AND on the patient's hospital floor or unit, greater than 50% of which was counseling/coordinating care for Port-A-Cath placement

## 2019-06-30 NOTE — Discharge Instructions (Signed)
Implanted Port Insertion, Care After This sheet gives you information about how to care for yourself after your procedure. Your health care provider may also give you more specific instructions. If you have problems or questions, contact your health care provider. What can I expect after the procedure? After the procedure, it is common to have:  Discomfort at the port insertion site.  Bruising on the skin over the port. This should improve over 3-4 days. Follow these instructions at home: Port care  After your port is placed, you will get a manufacturer's information card. The card has information about your port. Keep this card with you at all times.  Take care of the port as told by your health care provider. Ask your health care provider if you or a family member can get training for taking care of the port at home. A home health care nurse may also take care of the port.  Make sure to remember what type of port you have. Incision care      Follow instructions from your health care provider about how to take care of your port insertion site. Make sure you: ? Wash your hands with soap and water before and after you change your bandage (dressing). If soap and water are not available, use hand sanitizer. ? Change your dressing as told by your health care provider. ? Leave stitches (sutures), skin glue, or adhesive strips in place. These skin closures may need to stay in place for 2 weeks or longer. If adhesive strip edges start to loosen and curl up, you may trim the loose edges. Do not remove adhesive strips completely unless your health care provider tells you to do that.  Check your port insertion site every day for signs of infection. Check for: ? Redness, swelling, or pain. ? Fluid or blood. ? Warmth. ? Pus or a bad smell. Activity  Return to your normal activities as told by your health care provider. Ask your health care provider what activities are safe for you.  Do not  lift anything that is heavier than 10 lb (4.5 kg), or the limit that you are told, until your health care provider says that it is safe. General instructions  Take over-the-counter and prescription medicines only as told by your health care provider.  Do not take baths, swim, or use a hot tub until your health care provider approves. Ask your health care provider if you may take showers. You may only be allowed to take sponge baths.  Do not drive for 24 hours if you were given a sedative during your procedure.  Wear a medical alert bracelet in case of an emergency. This will tell any health care providers that you have a port.  Keep all follow-up visits as told by your health care provider. This is important. Contact a health care provider if:  You cannot flush your port with saline as directed, or you cannot draw blood from the port.  You have a fever or chills.  You have redness, swelling, or pain around your port insertion site.  You have fluid or blood coming from your port insertion site.  Your port insertion site feels warm to the touch.  You have pus or a bad smell coming from the port insertion site. Get help right away if:  You have chest pain or shortness of breath.  You have bleeding from your port that you cannot control. Summary  Take care of the port as told by your health   care provider. Keep the manufacturer's information card with you at all times.  Change your dressing as told by your health care provider.  Contact a health care provider if you have a fever or chills or if you have redness, swelling, or pain around your port insertion site.  Keep all follow-up visits as told by your health care provider. This information is not intended to replace advice given to you by your health care provider. Make sure you discuss any questions you have with your health care provider. Document Revised: 12/17/2017 Document Reviewed: 12/17/2017 Elsevier Patient Education   [place]. Moderate Conscious Sedation, Adult, Care After These instructions provide you with information about caring for yourself after your procedure. Your health care provider may also give you more specific instructions. Your treatment has been planned according to current medical practices, but problems sometimes occur. Call your health care provider if you have any problems or questions after your procedure. What can I expect after the procedure? After your procedure, it is common:  To feel sleepy for several hours.  To feel clumsy and have poor balance for several hours.  To have poor judgment for several hours.  To vomit if you eat too soon. Follow these instructions at home: For at least 24 hours after the procedure:   Do not: ? Participate in activities where you could fall or become injured. ? Drive. ? Use heavy machinery. ? Drink alcohol. ? Take sleeping pills or medicines that cause drowsiness. ? Make important decisions or sign legal documents. ? Take care of children on your own.  Rest. Eating and drinking  Follow the diet recommended by your health care provider.  If you vomit: ? Drink water, juice, or soup when you can drink without vomiting. ? Make sure you have little or no nausea before eating solid foods. General instructions  Have a responsible adult stay with you until you are awake and alert.  Take over-the-counter and prescription medicines only as told by your health care provider.  If you smoke, do not smoke without supervision.  Keep all follow-up visits as told by your health care provider. This is important. Contact a health care provider if:  You keep feeling nauseous or you keep vomiting.  You feel light-headed.  You develop a rash.  You have a fever. Get help right away if:  You have trouble breathing. This information is not intended to replace advice given to you by your health care provider. Make sure you discuss any  questions you have with your health care provider. Document Revised: 05/03/2017 Document Reviewed: 09/10/2015 Elsevier Patient Education  2020 [place] may remove the dressing and bandaid- tomorrow after 3:00 pm, shower, pat the area dry. It will be bruised and sore, you can take what you normally take for aches and pains. You do not have to put anything back over the site. Do not use the lidocaine cream or elma cream until 2 weeks or until the skin glue has come off( it usually takes 2 weeks) I recommend a button- up shirt to wear to the cancer center for treatments. Remember to tell everyone that you have a power port, so they will use it if they can.

## 2019-06-30 NOTE — Progress Notes (Signed)
Patient is using the [facility] assistance program.  [phone].  Called the number and they confirmed he is in their system and they have him down for assistance with a ride home today. Patients mother rode with him and she is in the lobby.

## 2019-06-30 NOTE — Procedures (Signed)
  Procedure: R IJ Port placement   EBL:   minimal Complications:  none immediate  See full dictation in [facility] PACS.  D. Kentrail Shew MD Main # [phone] Pager  [phone]    

## 2019-07-06 NOTE — Progress Notes (Signed)
[facility] OFFICE PROGRESS NOTE  Patient, No Pcp Per No address on file  DIAGNOSIS: Classical Hodgkin Lymphoma, Nodular Sclerosis subtype. He presented with bulky bilateral cervical lymphadenopathy, mediastinal lymphadenopathy, and mildly enlarged retroperitoneal periaortic adenopathy and iliac adenopathy. He also presented with involvement of the spleen. He was diagnosed in December 2020.  PRIOR THERAPY: None  CURRENT THERAPY: Systemic chemotherapy with doxorubicin, dacarbazine, vinblastine, and bleomycin on days 1 and 15 every 4 weeks. First dose expected on January 7th, 2021.  Status post cycle 1.  INTERVAL HISTORY: Terry Quinn 19 y.o. male returns to the clinic today for a follow-up visit.  The patient is feeling well today without any concerning complaints.  The patient is status post day 1 and 15 of cycle #1 and he tolerated his treatment well without any adverse side effects except for some nausea.  The patient is prescribed Zofran and Compazine for nausea.  He has noticed a decrease size in his cervical lymphadenopathy.  Denies any recent fever, chills, or weight loss. He is still reporting night sweats but states it is less frequent than before. He recently met with one of the nutritionist team members. His weight has been stable or increased over the last two appointments. He denies any chest pain, shortness of breath, cough, or hemoptysis.  He denies any constipation, diarrhea, early satiety, or abdominal pain. He notes a few episodes of vomiting but states it is controlled with his antiemetic. He denies any peripheral neuropathy but states his hands occasionally get cold to the touch.  He denies any recent signs and symptoms of infection.  He denies any abnormal bleeding or bruising including melena, hematochezia, hematuria, epistaxis, or gingival bleeding.  He is here today for evaluation before starting day 1 of cycle 2.    MEDICAL HISTORY:No past medical  history on file.  ALLERGIES:  has No Known Allergies.  MEDICATIONS:  Current Outpatient Medications  Medication Sig Dispense Refill  . allopurinol (ZYLOPRIM) 100 MG tablet Take 1 tablet (100 mg total) by mouth 2 (two) times daily. 60 tablet 2  . lidocaine-prilocaine (EMLA) cream Apply 1 application topically as needed. 30 g 1  . prochlorperazine (COMPAZINE) 10 MG tablet Take 1 tablet (10 mg total) by mouth every 6 (six) hours as needed for nausea or vomiting. 30 tablet 0   No current facility-administered medications for this visit.    SURGICAL HISTORY:  Past Surgical History:  Procedure Laterality Date  . IR IMAGING GUIDED PORT INSERTION  06/30/2019  . IR US GUIDE BX ASP/DRAIN  05/27/2019    REVIEW OF SYSTEMS:   Review of Systems  Constitutional: Negative for appetite change, chills, fatigue, fever and unexpected weight change.  HENT: Negative for mouth sores, nosebleeds, sore throat and trouble swallowing.  Eyes: Negative for eye problems and icterus.  Respiratory: Negative for cough, hemoptysis, shortness of breath and wheezing.   Cardiovascular: Negative for chest pain and leg swelling.  Gastrointestinal: Positive for nausea and some vomiting. Negative for abdominal pain, constipation, and diarrhea.  Genitourinary: Negative for bladder incontinence, difficulty urinating, dysuria, frequency and hematuria.   Musculoskeletal: Negative for back pain, gait problem, neck pain and neck stiffness.  Skin: Negative for itching and rash.  Neurological: Negative for dizziness, extremity weakness, gait problem, headaches, light-headedness and seizures.  Hematological: Significantly improved bilateral cervical adenopathy. Does not bruise/bleed easily.  Psychiatric/Behavioral: Negative for confusion, depression and sleep disturbance. The patient is not nervous/anxious.     PHYSICAL EXAMINATION:  Blood pressure 121/70,  pulse (!) 57, temperature 98.2 F (36.8 C), temperature source Temporal,  resp. rate 18, height 6' (1.829 m), weight 224 lb 6.4 oz (101.8 kg), SpO2 100 %.  ECOG PERFORMANCE STATUS: 1 - Symptomatic but completely ambulatory  Physical Exam  Constitutional: Oriented to person, place, and time and well-developed, well-nourished, and in no distress. HENT:  Head: Normocephalic and atraumatic.  Mouth/Throat: Oropharynx is clear and moist. No oropharyngeal exudate.  Eyes: Conjunctivae are normal. Right eye exhibits no discharge. Left eye exhibits no discharge. No scleral icterus.  Neck: Normal range of motion. Neck supple.  Cardiovascular: Normal rate, regular rhythm, normal heart sounds and intact distal pulses.   Pulmonary/Chest: Effort normal and breath sounds normal. No respiratory distress. No wheezes. No rales.  Abdominal: Soft. Bowel sounds are normal. Exhibits no distension and no mass. There is no tenderness.  Musculoskeletal: Normal range of motion. Exhibits no edema.  Lymphadenopathy:    Significantly improved bilateral cervical adenopathy.   Neurological: Alert and oriented to person, place, and time. Exhibits normal muscle tone. Gait normal. Coordination normal.  Skin: Skin is warm and dry. No rash noted. Not diaphoretic. No erythema. No pallor.  Psychiatric: Mood, memory and judgment normal.  Vitals reviewed.  LABORATORY DATA: Lab Results  Component Value Date   WBC 4.6 07/07/2019   HGB 10.1 (L) 07/07/2019   HCT 32.1 (L) 07/07/2019   MCV 80.9 07/07/2019   PLT 268 07/07/2019      Chemistry      Component Value Date/Time   NA 138 07/07/2019 1055   K 4.3 07/07/2019 1055   CL 102 07/07/2019 1055   CO2 28 07/07/2019 1055   BUN 11 07/07/2019 1055   CREATININE 0.74 07/07/2019 1055      Component Value Date/Time   CALCIUM 9.0 07/07/2019 1055   ALKPHOS 72 07/07/2019 1055   AST 12 (L) 07/07/2019 1055   ALT 17 07/07/2019 1055   BILITOT 0.3 07/07/2019 1055       RADIOGRAPHIC STUDIES:  IR IMAGING GUIDED PORT INSERTION  Result Date:  06/30/2019 CLINICAL DATA:  Lymphoma, needs durable venous access for treatment regimen EXAM: TUNNELED PORT CATHETER PLACEMENT WITH ULTRASOUND AND FLUOROSCOPIC GUIDANCE FLUOROSCOPY TIME:  0.1 minute; 90 uGym2 DAP ANESTHESIA/SEDATION: Intravenous Fentanyl 183mg and Versed 325mwere administered as conscious sedation during continuous monitoring of the patient's level of consciousness and physiological / cardiorespiratory status by the radiology RN, with a total moderate sedation time of 15 minutes. TECHNIQUE: The procedure, risks, benefits, and alternatives were explained to the patient. Questions regarding the procedure were encouraged and answered. The patient understands and consents to the procedure. As antibiotic prophylaxis, cefazolin 2 g was ordered pre-procedure and administered intravenously within one hour of incision. Patency of the right IJ vein was confirmed with ultrasound with image documentation. An appropriate skin site was determined. Skin site was marked. Region was prepped using maximum barrier technique including cap and mask, sterile gown, sterile gloves, large sterile sheet, and Chlorhexidine as cutaneous antisepsis. The region was infiltrated locally with 1% lidocaine. Under real-time ultrasound guidance, the right IJ vein was accessed with a 21 gauge micropuncture needle; the needle tip within the vein was confirmed with ultrasound image documentation. Needle was exchanged over a 018 guidewire for transitional dilator, and vascular measurement was performed. A small incision was made on the right anterior chest wall and a subcutaneous pocket fashioned. The power-injectable port was positioned and its catheter tunneled to the right IJ dermatotomy site. The transitional dilator was exchanged over  an Amplatz wire for a peel-away sheath, through which the port catheter, which had been trimmed to the appropriate length, was advanced and positioned under fluoroscopy with its tip at the cavoatrial  junction. Spot chest radiograph confirms good catheter position and no pneumothorax. The port was flushed per protocol. The pocket was closed with deep interrupted and subcuticular continuous 3-0 Monocryl sutures. The incisions were covered with Dermabond then covered with a sterile dressing. The patient tolerated the procedure well. COMPLICATIONS: COMPLICATIONS None immediate IMPRESSION: Technically successful right IJ power-injectable port catheter placement. Ready for routine use. Electronically Signed   By: D  Hassell M.D.   On: 06/30/2019 16:07     ASSESSMENT/PLAN:  This is a very pleasant 18-year-old male diagnosed with classical Hodgkin's lymphoma, nodular sclerosing subtype.  He presented with bulky bilateral cervical lymphadenopathy, mediastinal lymphadenopathy, and mildly enlarged retroperitoneal, periaortic, and iliac adenopathy.  He also presents with spleen involvement.  He was diagnosed in December 2020  The patient is currently undergoing systemic chemotherapy with ABVD.  He is status post 1 cycle he tolerated well except for some nausea.   Labs were reviewed.  Recommend he proceed with cycle #2 today scheduled.  We will see him back for follow-up visit in 2 weeks for evaluation before starting day 15 of cycle 2.  He will continue to take his oral iron supplement.   He recently quit smoking for which he was congratulated and was encouraged to continue to quit.    I will arrange for the patient to have a restaging PET scan before starting cycle #3 of his treatment with the target scan date of 2/26.  He will continue to take compazine for his nausea.   The patient was advised to call immediately if he has any concerning symptoms in the interval. The patient voices understanding of current disease status and treatment options and is in agreement with the current care plan. All questions were answered. The patient knows to call the clinic with any problems, questions or concerns. We  can certainly see the patient much sooner if necessary.  No orders of the defined types were placed in this encounter.    Cassandra L Heilingoetter, PA-C 07/07/19  

## 2019-07-07 ENCOUNTER — Inpatient Hospital Stay: Payer: Medicaid Other | Attending: Physician Assistant | Admitting: Physician Assistant

## 2019-07-07 ENCOUNTER — Inpatient Hospital Stay: Payer: Medicaid Other

## 2019-07-07 ENCOUNTER — Encounter: Payer: Self-pay | Admitting: Physician Assistant

## 2019-07-07 ENCOUNTER — Other Ambulatory Visit: Payer: Self-pay

## 2019-07-07 VITALS — BP 121/70 | HR 57 | Temp 98.2°F | Resp 18 | Ht 72.0 in | Wt 224.4 lb

## 2019-07-07 DIAGNOSIS — C8111 Nodular sclerosis classical Hodgkin lymphoma, lymph nodes of head, face, and neck: Secondary | ICD-10-CM

## 2019-07-07 DIAGNOSIS — R112 Nausea with vomiting, unspecified: Secondary | ICD-10-CM | POA: Insufficient documentation

## 2019-07-07 DIAGNOSIS — C8118 Nodular sclerosis classical Hodgkin lymphoma, lymph nodes of multiple sites: Secondary | ICD-10-CM | POA: Insufficient documentation

## 2019-07-07 DIAGNOSIS — Z5111 Encounter for antineoplastic chemotherapy: Secondary | ICD-10-CM | POA: Insufficient documentation

## 2019-07-07 DIAGNOSIS — R61 Generalized hyperhidrosis: Secondary | ICD-10-CM | POA: Diagnosis not present

## 2019-07-07 DIAGNOSIS — Z87891 Personal history of nicotine dependence: Secondary | ICD-10-CM | POA: Insufficient documentation

## 2019-07-07 DIAGNOSIS — Z79899 Other long term (current) drug therapy: Secondary | ICD-10-CM | POA: Insufficient documentation

## 2019-07-07 DIAGNOSIS — Z95828 Presence of other vascular implants and grafts: Secondary | ICD-10-CM

## 2019-07-07 LAB — CBC WITH DIFFERENTIAL (CANCER CENTER ONLY)
Abs Immature Granulocytes: 0.01 10*3/uL (ref 0.00–0.07)
Basophils Absolute: 0 10*3/uL (ref 0.0–0.1)
Basophils Relative: 1 %
Eosinophils Absolute: 0.3 10*3/uL (ref 0.0–0.5)
Eosinophils Relative: 6 %
HCT: 32.1 % — ABNORMAL LOW (ref 39.0–52.0)
Hemoglobin: 10.1 g/dL — ABNORMAL LOW (ref 13.0–17.0)
Immature Granulocytes: 0 %
Lymphocytes Relative: 37 %
Lymphs Abs: 1.7 10*3/uL (ref 0.7–4.0)
MCH: 25.4 pg — ABNORMAL LOW (ref 26.0–34.0)
MCHC: 31.5 g/dL (ref 30.0–36.0)
MCV: 80.9 fL (ref 80.0–100.0)
Monocytes Absolute: 0.5 10*3/uL (ref 0.1–1.0)
Monocytes Relative: 10 %
Neutro Abs: 2.1 10*3/uL (ref 1.7–7.7)
Neutrophils Relative %: 46 %
Platelet Count: 268 10*3/uL (ref 150–400)
RBC: 3.97 MIL/uL — ABNORMAL LOW (ref 4.22–5.81)
RDW: 21.2 % — ABNORMAL HIGH (ref 11.5–15.5)
WBC Count: 4.6 10*3/uL (ref 4.0–10.5)
nRBC: 0 % (ref 0.0–0.2)

## 2019-07-07 LAB — CMP (CANCER CENTER ONLY)
ALT: 17 U/L (ref 0–44)
AST: 12 U/L — ABNORMAL LOW (ref 15–41)
Albumin: 3.8 g/dL (ref 3.5–5.0)
Alkaline Phosphatase: 72 U/L (ref 38–126)
Anion gap: 8 (ref 5–15)
BUN: 11 mg/dL (ref 6–20)
CO2: 28 mmol/L (ref 22–32)
Calcium: 9 mg/dL (ref 8.9–10.3)
Chloride: 102 mmol/L (ref 98–111)
Creatinine: 0.74 mg/dL (ref 0.61–1.24)
GFR, Est AFR Am: >60 mL/min (ref >60)
GFR, Estimated: >60 mL/min (ref >60)
Glucose, Bld: 84 mg/dL (ref 70–99)
Potassium: 4.3 mmol/L (ref 3.5–5.1)
Sodium: 138 mmol/L (ref 135–145)
Total Bilirubin: 0.3 mg/dL (ref 0.3–1.2)
Total Protein: 7.6 g/dL (ref 6.5–8.1)

## 2019-07-07 MED ORDER — SODIUM CHLORIDE 0.9% FLUSH
10.0000 mL | INTRAVENOUS | Status: DC | PRN
  Administered 2019-07-07: 10 mL
  Filled 2019-07-07: qty 10

## 2019-07-07 MED ORDER — VINBLASTINE SULFATE CHEMO INJECTION 1 MG/ML
5.8500 mg/m2 | Freq: Once | INTRAVENOUS | Status: AC
  Administered 2019-07-07: 13 mg via INTRAVENOUS
  Filled 2019-07-07: qty 13

## 2019-07-07 MED ORDER — DEXAMETHASONE SODIUM PHOSPHATE 10 MG/ML IJ SOLN
10.0000 mg | Freq: Once | INTRAMUSCULAR | Status: AC
  Administered 2019-07-07: 10 mg via INTRAVENOUS

## 2019-07-07 MED ORDER — SODIUM CHLORIDE 0.9 % IV SOLN
375.0000 mg/m2 | Freq: Once | INTRAVENOUS | Status: AC
  Administered 2019-07-07: 830 mg via INTRAVENOUS
  Filled 2019-07-07: qty 83

## 2019-07-07 MED ORDER — SODIUM CHLORIDE 0.9 % IV SOLN
Freq: Once | INTRAVENOUS | Status: AC
  Filled 2019-07-07: qty 250

## 2019-07-07 MED ORDER — HEPARIN SOD (PORK) LOCK FLUSH 100 UNIT/ML IV SOLN
500.0000 [IU] | Freq: Once | INTRAVENOUS | Status: AC | PRN
  Administered 2019-07-07: 500 [IU]
  Filled 2019-07-07: qty 5

## 2019-07-07 MED ORDER — DEXAMETHASONE SODIUM PHOSPHATE 10 MG/ML IJ SOLN
INTRAMUSCULAR | Status: AC
  Filled 2019-07-07: qty 1

## 2019-07-07 MED ORDER — PALONOSETRON HCL INJECTION 0.25 MG/5ML
0.2500 mg | Freq: Once | INTRAVENOUS | Status: AC
  Administered 2019-07-07: 0.25 mg via INTRAVENOUS

## 2019-07-07 MED ORDER — SODIUM CHLORIDE 0.9% FLUSH
10.0000 mL | INTRAVENOUS | Status: DC | PRN
  Administered 2019-07-07: 11:00:00 10 mL via INTRAVENOUS
  Filled 2019-07-07: qty 10

## 2019-07-07 MED ORDER — PALONOSETRON HCL INJECTION 0.25 MG/5ML
INTRAVENOUS | Status: AC
  Filled 2019-07-07: qty 5

## 2019-07-07 MED ORDER — SODIUM CHLORIDE 0.9 % IV SOLN
10.0000 [IU]/m2 | Freq: Once | INTRAVENOUS | Status: AC
  Administered 2019-07-07: 22 [IU] via INTRAVENOUS
  Filled 2019-07-07: qty 7.33

## 2019-07-07 MED ORDER — SODIUM CHLORIDE 0.9 % IV SOLN
150.0000 mg | Freq: Once | INTRAVENOUS | Status: AC
  Administered 2019-07-07: 150 mg via INTRAVENOUS
  Filled 2019-07-07: qty 150

## 2019-07-07 MED ORDER — DOXORUBICIN HCL CHEMO IV INJECTION 2 MG/ML
25.0000 mg/m2 | Freq: Once | INTRAVENOUS | Status: AC
  Administered 2019-07-07: 56 mg via INTRAVENOUS
  Filled 2019-07-07: qty 28

## 2019-07-07 NOTE — Patient Instructions (Signed)

## 2019-07-07 NOTE — Patient Instructions (Signed)
[facility] Discharge Instructions for Patients Receiving Chemotherapy  Today you received the following chemotherapy agents:  Adriamycin, Vinblastine, Bleomycin and Dacarbazine.  To help prevent nausea and vomiting after your treatment, we encourage you to take your nausea medication as prescribed.   If you develop nausea and vomiting that is not controlled by your nausea medication, call the clinic.   BELOW ARE SYMPTOMS THAT SHOULD BE REPORTED IMMEDIATELY:  *FEVER GREATER THAN 100.5 F  *CHILLS WITH OR WITHOUT FEVER  NAUSEA AND VOMITING THAT IS NOT CONTROLLED WITH YOUR NAUSEA MEDICATION  *UNUSUAL SHORTNESS OF BREATH  *UNUSUAL BRUISING OR BLEEDING  TENDERNESS IN MOUTH AND THROAT WITH OR WITHOUT PRESENCE OF ULCERS  *URINARY PROBLEMS  *BOWEL PROBLEMS  UNUSUAL RASH Items with * indicate a potential emergency and should be followed up as soon as possible.  Feel free to call the clinic should you have any questions or concerns. The clinic phone number is [phone].  Please show the [facility] at check-in to the Emergency Department and triage nurse.

## 2019-07-17 ENCOUNTER — Telehealth: Payer: Self-pay | Admitting: *Deleted

## 2019-07-17 NOTE — Telephone Encounter (Signed)
Received vm message from Terry Quinn, pt's mentor. She is requesting a call back from [facility], PA, who saw pt on 07/07/19.  TCT Ms. Terry Quinn. No answer. Left vm message for her to call back next week as Cassie, PA is not in the office today.

## 2019-07-20 NOTE — Progress Notes (Signed)
Terry [facility] OFFICE PROGRESS NOTE  Patient, No Pcp Per No address on file  DIAGNOSIS: Classical Hodgkin Lymphoma, Nodular Sclerosis subtype. He presented with bulky bilateral cervical lymphadenopathy, mediastinal lymphadenopathy, and mildly enlarged retroperitoneal periaortic adenopathy and iliac adenopathy. He also presented with involvement of the spleen. He was diagnosed in December 2020.  PRIOR THERAPY: None  CURRENT THERAPY: Systemic chemotherapy with doxorubicin, dacarbazine, vinblastine, and bleomycin on days 1 and 15 every 4 weeks. First dose expected on January 7th, 2021.Status post day 1 cycle 2.   INTERVAL HISTORY: Terry Quinn 19 y.o. male returns the clinic today for a follow-up visit.  The patient is feeling well today without any concerning complaints except for a mouth sore on his inner lower lip. He has been using maalox and orajel. Denies any other mouth sores, sore throat, thrush, or gum tenderness/soreness.  He tolerated his last cycle of treatment better. He reports less nausea than cycle #1. Has zofran and compazine at home for nausea if needed. Denies any recent fever, chills, or weight loss. He actually gained weight since his last appointment. He has not had anymore episodes of night sweats recently.  He denies any chest pain, shortness of breath, cough, or hemoptysis.  He denies any constipation, diarrhea, early satiety, or abdominal pain. He denies any peripheral neuropathy. He denies any recent signs and symptoms of infection.  He denies any abnormal bleeding or bruising including melena, hematochezia, hematuria, epistaxis, or gingival bleeding.  He is here today for evaluation before starting day 15 of cycle 2.    MEDICAL HISTORY:No past medical history on file.  ALLERGIES:  has No Known Allergies.  MEDICATIONS:  Current Outpatient Medications  Medication Sig Dispense Refill  . allopurinol (ZYLOPRIM) 100 MG tablet Take 1 tablet (100 mg  total) by mouth 2 (two) times daily. 60 tablet 2  . lidocaine-prilocaine (EMLA) cream Apply 1 application topically as needed. 30 g 1  . prochlorperazine (COMPAZINE) 10 MG tablet Take 1 tablet (10 mg total) by mouth every 6 (six) hours as needed for nausea or vomiting. 30 tablet 0   No current facility-administered medications for this visit.    SURGICAL HISTORY:  Past Surgical History:  Procedure Laterality Date  . IR IMAGING GUIDED PORT INSERTION  06/30/2019  . IR US GUIDE BX ASP/DRAIN  05/27/2019    REVIEW OF SYSTEMS:   Review of Systems  Constitutional: Negative for appetite change, chills, fatigue, fever and unexpected weight change.  HENT: Positive for single mouth sore inner lower lip. Negative for nosebleeds, sore throat and trouble swallowing.  Eyes: Negative for eye problems and icterus.  Respiratory: Negative for cough, hemoptysis, shortness of breath and wheezing.   Cardiovascular: Negative for chest pain and leg swelling.  Gastrointestinal: Negative for abdominal pain, constipation, diarrhea, nausea (improved) and vomiting.  Genitourinary: Negative for bladder incontinence, difficulty urinating, dysuria, frequency and hematuria.   Musculoskeletal: Negative for back pain, gait problem, neck pain and neck stiffness.  Skin: Negative for itching and rash.  Neurological: Negative for dizziness, extremity weakness, gait problem, headaches, light-headedness and seizures.  Hematological: Negative for adenopathy. Does not bruise/bleed easily.  Psychiatric/Behavioral: Negative for confusion, depression and sleep disturbance. The patient is not nervous/anxious.     PHYSICAL EXAMINATION:  There were no vitals taken for this visit.  ECOG PERFORMANCE STATUS: 1 - Symptomatic but completely ambulatory  Physical Exam  Constitutional: Oriented to person, place, and time and well-developed, well-nourished, and in no distress.   HENT:  Head:  Normocephalic and atraumatic.  Mouth/Throat:  Single mouth sore on inner lower lip. Oropharynx is clear and moist. No oropharyngeal exudate. No other ulcerations. Eyes: Conjunctivae are normal. Right eye exhibits no discharge. Left eye exhibits no discharge. No scleral icterus.  Neck: Normal range of motion. Neck supple.  Cardiovascular: Normal rate, regular rhythm, normal heart sounds and intact distal pulses.   Pulmonary/Chest: Effort normal and breath sounds normal. No respiratory distress. No wheezes. No rales.  Abdominal: Soft. Bowel sounds are normal. Exhibits no distension and no mass. There is no tenderness.  Musculoskeletal: Normal range of motion. Exhibits no edema.  Lymphadenopathy:    No cervical adenopathy.  Neurological: Alert and oriented to person, place, and time. Exhibits normal muscle tone. Gait normal. Coordination normal.  Skin: Skin is warm and dry. No rash noted. Not diaphoretic. No erythema. No pallor.  Psychiatric: Mood, memory and judgment normal.  Vitals reviewed.  LABORATORY DATA: Lab Results  Component Value Date   WBC 4.6 07/07/2019   HGB 10.1 (L) 07/07/2019   HCT 32.1 (L) 07/07/2019   MCV 80.9 07/07/2019   PLT 268 07/07/2019      Chemistry      Component Value Date/Time   NA 138 07/07/2019 1055   K 4.3 07/07/2019 1055   CL 102 07/07/2019 1055   CO2 28 07/07/2019 1055   BUN 11 07/07/2019 1055   CREATININE 0.74 07/07/2019 1055      Component Value Date/Time   CALCIUM 9.0 07/07/2019 1055   ALKPHOS 72 07/07/2019 1055   AST 12 (L) 07/07/2019 1055   ALT 17 07/07/2019 1055   BILITOT 0.3 07/07/2019 1055       RADIOGRAPHIC STUDIES:  IR IMAGING GUIDED PORT INSERTION  Result Date: 06/30/2019 CLINICAL DATA:  Lymphoma, needs durable venous access for treatment regimen EXAM: TUNNELED PORT CATHETER PLACEMENT WITH ULTRASOUND AND FLUOROSCOPIC GUIDANCE FLUOROSCOPY TIME:  0.1 minute; 90 uGym2 DAP ANESTHESIA/SEDATION: Intravenous Fentanyl 126mcg and Versed 3mg  were administered as conscious sedation  during continuous monitoring of the patient's level of consciousness and physiological / cardiorespiratory status by the radiology RN, with a total moderate sedation time of 15 minutes. TECHNIQUE: The procedure, risks, benefits, and alternatives were explained to the patient. Questions regarding the procedure were encouraged and answered. The patient understands and consents to the procedure. As antibiotic prophylaxis, cefazolin 2 g was ordered pre-procedure and administered intravenously within one hour of incision. Patency of the right IJ vein was confirmed with ultrasound with image documentation. An appropriate skin site was determined. Skin site was marked. Region was prepped using maximum barrier technique including cap and mask, sterile gown, sterile gloves, large sterile sheet, and Chlorhexidine as cutaneous antisepsis. The region was infiltrated locally with 1% lidocaine. Under real-time ultrasound guidance, the right IJ vein was accessed with a 21 gauge micropuncture needle; the needle tip within the vein was confirmed with ultrasound image documentation. Needle was exchanged over a 018 guidewire for transitional dilator, and vascular measurement was performed. A small incision was made on the right anterior chest wall and a subcutaneous pocket fashioned. The power-injectable port was positioned and its catheter tunneled to the right IJ dermatotomy site. The transitional dilator was exchanged over an Amplatz wire for a peel-away sheath, through which the port catheter, which had been trimmed to the appropriate length, was advanced and positioned under fluoroscopy with its tip at the cavoatrial junction. Spot chest radiograph confirms good catheter position and no pneumothorax. The port was flushed per protocol. The pocket was closed  with deep interrupted and subcuticular continuous 3-0 Monocryl sutures. The incisions were covered with Dermabond then covered with a sterile dressing. The patient tolerated  the procedure well. COMPLICATIONS: COMPLICATIONS None immediate IMPRESSION: Technically successful right IJ power-injectable port catheter placement. Ready for routine use. Electronically Signed   By: Lucrezia Europe M.D.   On: 06/30/2019 16:07     ASSESSMENT/PLAN:  This is a very pleasant 19 year old male diagnosed with classical Hodgkin's lymphoma, nodular sclerosing subtype.  He presented with bulky bilateral cervical lymphadenopathy, mediastinal lymphadenopathy, and mildly enlarged retroperitoneal, periaortic, and iliac adenopathy.  He also presents with spleen involvement.  He was diagnosed in December 2020  The patient is currently undergoing systemic chemotherapy with ABVD (doxorubicin, dacarbazine, vinblastine, and bleomycin) on days 1 and 15 every 4 weeks. He is status post day 1 cycle 2. He is tolerating treatment well except for mild nausea with cycle #1 which is controlled with his anti-emetic.    The patient was seen with Dr. Julien Nordmann today. Labs were reviewed. Recommend that he proceed with cycle #2 day 15 today as scheduled.   We will see him back for a follow up visit in 2 weeks before starting cycle #3.   He is scheduled for his restaging PET scan on 2/26. He was advised to keep that appointment. We will review his restaging PET scan at his next appointment.   For his mouth sore, he will continue with orajel. He was advised to use salt water rinses as well.   Discussed with the patient to continue to follow with the pandemic precautions such as masking, washing his hands, and social distancing.   The patient was advised to call immediately if he has any concerning symptoms in the interval. The patient voices understanding of current disease status and treatment options and is in agreement with the current care plan. All questions were answered. The patient knows to call the clinic with any problems, questions or concerns. We can certainly see the patient much sooner if  necessary     No orders of the defined types were placed in this encounter.    Sheralee Qazi L Hayde Kilgour, PA-C 07/20/19  ADDENDUM: Hematology/Oncology Attending: I had a face-to-face encounter with the patient today.  I recommended his care plan.  This is a very pleasant 19 years old white male recently diagnosed with classic Hodgkin lymphoma, nodular sclerosing subtype.  He is currently undergoing systemic chemotherapy with ABVD on days 1 and 15 every 4 weeks is status post 1 cycle and he is here today for day 15 of cycle #2. The patient is tolerating his treatment well and he starts gaining weight again.  He also noticed significant improvement in the neck lymphadenopathy. I recommended for the patient to proceed with day 15 of cycle #2 today as planned. We will see him back for follow-up visit in 2 weeks for evaluation with repeat PET scan for restaging of his disease. The patient was advised to call immediately if he has any concerning symptoms in the interval.  Disclaimer: This note was dictated with voice recognition software. Similar sounding words can inadvertently be transcribed and may be missed upon review. Eilleen Kempf, MD 07/21/19

## 2019-07-21 ENCOUNTER — Inpatient Hospital Stay: Payer: Medicaid Other

## 2019-07-21 ENCOUNTER — Encounter: Payer: Self-pay | Admitting: *Deleted

## 2019-07-21 ENCOUNTER — Encounter: Payer: Self-pay | Admitting: Physician Assistant

## 2019-07-21 ENCOUNTER — Telehealth: Payer: Self-pay | Admitting: Physician Assistant

## 2019-07-21 ENCOUNTER — Other Ambulatory Visit: Payer: Self-pay

## 2019-07-21 ENCOUNTER — Inpatient Hospital Stay (HOSPITAL_BASED_OUTPATIENT_CLINIC_OR_DEPARTMENT_OTHER): Payer: Medicaid Other | Admitting: Physician Assistant

## 2019-07-21 VITALS — BP 129/78 | HR 65 | Temp 98.0°F | Resp 18 | Ht 72.0 in | Wt 237.7 lb

## 2019-07-21 DIAGNOSIS — C8111 Nodular sclerosis classical Hodgkin lymphoma, lymph nodes of head, face, and neck: Secondary | ICD-10-CM

## 2019-07-21 DIAGNOSIS — Z5111 Encounter for antineoplastic chemotherapy: Secondary | ICD-10-CM | POA: Diagnosis not present

## 2019-07-21 DIAGNOSIS — Z95828 Presence of other vascular implants and grafts: Secondary | ICD-10-CM

## 2019-07-21 LAB — CMP (CANCER CENTER ONLY)
ALT: 15 U/L (ref 0–44)
AST: 12 U/L — ABNORMAL LOW (ref 15–41)
Albumin: 3.8 g/dL (ref 3.5–5.0)
Alkaline Phosphatase: 61 U/L (ref 38–126)
Anion gap: 8 (ref 5–15)
BUN: 11 mg/dL (ref 6–20)
CO2: 27 mmol/L (ref 22–32)
Calcium: 8.7 mg/dL — ABNORMAL LOW (ref 8.9–10.3)
Chloride: 107 mmol/L (ref 98–111)
Creatinine: 0.66 mg/dL (ref 0.61–1.24)
GFR, Est AFR Am: >60 mL/min (ref >60)
GFR, Estimated: >60 mL/min (ref >60)
Glucose, Bld: 89 mg/dL (ref 70–99)
Potassium: 4.2 mmol/L (ref 3.5–5.1)
Sodium: 142 mmol/L (ref 135–145)
Total Bilirubin: 0.2 mg/dL — ABNORMAL LOW (ref 0.3–1.2)
Total Protein: 6.7 g/dL (ref 6.5–8.1)

## 2019-07-21 LAB — CBC WITH DIFFERENTIAL (CANCER CENTER ONLY)
Abs Immature Granulocytes: 0.02 10*3/uL (ref 0.00–0.07)
Basophils Absolute: 0.1 10*3/uL (ref 0.0–0.1)
Basophils Relative: 1 %
Eosinophils Absolute: 0.9 10*3/uL — ABNORMAL HIGH (ref 0.0–0.5)
Eosinophils Relative: 14 %
HCT: 34.7 % — ABNORMAL LOW (ref 39.0–52.0)
Hemoglobin: 11.1 g/dL — ABNORMAL LOW (ref 13.0–17.0)
Immature Granulocytes: 0 %
Lymphocytes Relative: 39 %
Lymphs Abs: 2.4 10*3/uL (ref 0.7–4.0)
MCH: 26.6 pg (ref 26.0–34.0)
MCHC: 32 g/dL (ref 30.0–36.0)
MCV: 83 fL (ref 80.0–100.0)
Monocytes Absolute: 0.6 10*3/uL (ref 0.1–1.0)
Monocytes Relative: 9 %
Neutro Abs: 2.3 10*3/uL (ref 1.7–7.7)
Neutrophils Relative %: 37 %
Platelet Count: 177 10*3/uL (ref 150–400)
RBC: 4.18 MIL/uL — ABNORMAL LOW (ref 4.22–5.81)
RDW: 21.8 % — ABNORMAL HIGH (ref 11.5–15.5)
WBC Count: 6.2 10*3/uL (ref 4.0–10.5)
nRBC: 0 % (ref 0.0–0.2)

## 2019-07-21 MED ORDER — SODIUM CHLORIDE 0.9 % IV SOLN
375.0000 mg/m2 | Freq: Once | INTRAVENOUS | Status: AC
  Administered 2019-07-21: 830 mg via INTRAVENOUS
  Filled 2019-07-21: qty 83

## 2019-07-21 MED ORDER — SODIUM CHLORIDE 0.9 % IV SOLN
150.0000 mg | Freq: Once | INTRAVENOUS | Status: AC
  Administered 2019-07-21: 150 mg via INTRAVENOUS
  Filled 2019-07-21: qty 150

## 2019-07-21 MED ORDER — PALONOSETRON HCL INJECTION 0.25 MG/5ML
0.2500 mg | Freq: Once | INTRAVENOUS | Status: AC
  Administered 2019-07-21: 0.25 mg via INTRAVENOUS

## 2019-07-21 MED ORDER — SODIUM CHLORIDE 0.9% FLUSH
10.0000 mL | INTRAVENOUS | Status: DC | PRN
  Administered 2019-07-21: 10 mL
  Filled 2019-07-21: qty 10

## 2019-07-21 MED ORDER — SODIUM CHLORIDE 0.9% FLUSH
10.0000 mL | INTRAVENOUS | Status: DC | PRN
  Administered 2019-07-21: 10 mL via INTRAVENOUS
  Filled 2019-07-21: qty 10

## 2019-07-21 MED ORDER — HEPARIN SOD (PORK) LOCK FLUSH 100 UNIT/ML IV SOLN
500.0000 [IU] | Freq: Once | INTRAVENOUS | Status: AC | PRN
  Administered 2019-07-21: 500 [IU]
  Filled 2019-07-21: qty 5

## 2019-07-21 MED ORDER — SODIUM CHLORIDE 0.9 % IV SOLN
10.0000 [IU]/m2 | Freq: Once | INTRAVENOUS | Status: AC
  Administered 2019-07-21: 22 [IU] via INTRAVENOUS
  Filled 2019-07-21: qty 7.33

## 2019-07-21 MED ORDER — DEXAMETHASONE SODIUM PHOSPHATE 10 MG/ML IJ SOLN
10.0000 mg | Freq: Once | INTRAMUSCULAR | Status: AC
  Administered 2019-07-21: 10 mg via INTRAVENOUS

## 2019-07-21 MED ORDER — DOXORUBICIN HCL CHEMO IV INJECTION 2 MG/ML
25.0000 mg/m2 | Freq: Once | INTRAVENOUS | Status: AC
  Administered 2019-07-21: 56 mg via INTRAVENOUS
  Filled 2019-07-21: qty 28

## 2019-07-21 MED ORDER — VINBLASTINE SULFATE CHEMO INJECTION 1 MG/ML
5.8500 mg/m2 | Freq: Once | INTRAVENOUS | Status: AC
  Administered 2019-07-21: 13 mg via INTRAVENOUS
  Filled 2019-07-21: qty 13

## 2019-07-21 MED ORDER — SODIUM CHLORIDE 0.9 % IV SOLN
Freq: Once | INTRAVENOUS | Status: AC
  Filled 2019-07-21: qty 250

## 2019-07-21 MED ORDER — PALONOSETRON HCL INJECTION 0.25 MG/5ML
INTRAVENOUS | Status: AC
  Filled 2019-07-21: qty 5

## 2019-07-21 MED ORDER — DEXAMETHASONE SODIUM PHOSPHATE 10 MG/ML IJ SOLN
INTRAMUSCULAR | Status: AC
  Filled 2019-07-21: qty 1

## 2019-07-21 NOTE — Telephone Encounter (Signed)
Received permission from the patient to talk to Elvera Lennox, who is his mentor. She was inquiring about his number of treatments, prognosis, and she provided me with more information regarding his case/situation. Discussed strategies to best help him.

## 2019-07-21 NOTE — Patient Instructions (Signed)
[facility] Discharge Instructions for Patients Receiving Chemotherapy  Today you received the following chemotherapy agents Doxorubicin (ADRIAMYCIN), Vinblastine (VELBAN), Bleomycin (BLEOCIN) & Dacarbazine (DTIC).  To help prevent nausea and vomiting after your treatment, we encourage you to take your nausea medication as prescribed.  If you develop nausea and vomiting that is not controlled by your nausea medication, call the clinic.   BELOW ARE SYMPTOMS THAT SHOULD BE REPORTED IMMEDIATELY:  *FEVER GREATER THAN 100.5 F  *CHILLS WITH OR WITHOUT FEVER  NAUSEA AND VOMITING THAT IS NOT CONTROLLED WITH YOUR NAUSEA MEDICATION  *UNUSUAL SHORTNESS OF BREATH  *UNUSUAL BRUISING OR BLEEDING  TENDERNESS IN MOUTH AND THROAT WITH OR WITHOUT PRESENCE OF ULCERS  *URINARY PROBLEMS  *BOWEL PROBLEMS  UNUSUAL RASH Items with * indicate a potential emergency and should be followed up as soon as possible.  Feel free to call the clinic should you have any questions or concerns. The clinic phone number is [phone].  Please show the [facility] at check-in to the Emergency Department and triage nurse.  Coronavirus (COVID-19) Are you at risk?  Are you at risk for the Coronavirus (COVID-19)?  To be considered HIGH RISK for Coronavirus (COVID-19), you have to meet the following criteria:  . Traveled to [place], [place], [place], [place] or [place]; or in the [place] to [place], [place], [place], or [place]; and have fever, cough, and shortness of breath within the last 2 weeks of travel OR . Been in close contact with a person diagnosed with COVID-19 within the last 2 weeks and have fever, cough, and shortness of breath . IF YOU DO NOT MEET THESE CRITERIA, YOU ARE CONSIDERED LOW RISK FOR COVID-19.  What to do if you are HIGH RISK for COVID-19?  Marland Kitchen If you are having a medical emergency, call 911. . Seek medical care right away. Before you go to a doctor's  office, urgent care or emergency department, call ahead and tell them about your recent travel, contact with someone diagnosed with COVID-19, and your symptoms. You should receive instructions from your physician's office regarding next steps of care.  . When you arrive at healthcare provider, tell the healthcare staff immediately you have returned from visiting [place], [place], [place], [place] or [place]; or traveled in the [place] to [place], [place], [place], or [place]; in the last two weeks or you have been in close contact with a person diagnosed with COVID-19 in the last 2 weeks.   . Tell the health care staff about your symptoms: fever, cough and shortness of breath. . After you have been seen by a medical provider, you will be either: o Tested for (COVID-19) and discharged home on quarantine except to seek medical care if symptoms worsen, and asked to  - Stay home and avoid contact with others until you get your results (4-5 days)  - Avoid travel on public transportation if possible (such as bus, train, or airplane) or o Sent to the Emergency Department by EMS for evaluation, COVID-19 testing, and possible admission depending on your condition and test results.  What to do if you are LOW RISK for COVID-19?  Reduce your risk of any infection by using the same precautions used for avoiding the common cold or flu:  Marland Kitchen Wash your hands often with soap and warm water for at least 20 seconds.  If soap and water are not readily available, use an alcohol-based hand sanitizer with at least 60%  alcohol.  . If coughing or sneezing, cover your mouth and nose by coughing or sneezing into the elbow areas of your shirt or coat, into a tissue or into your sleeve (not your hands). . Avoid shaking hands with others and consider head nods or verbal greetings only. . Avoid touching your eyes, nose, or mouth with unwashed hands.  . Avoid close contact with people who are sick. . Avoid places or  events with large numbers of people in one location, like concerts or sporting events. . Carefully consider travel plans you have or are making. . If you are planning any travel outside or inside the [place], visit the CDC's Travelers' Health webpage for the latest health notices. . If you have some symptoms but not all symptoms, continue to monitor at home and seek medical attention if your symptoms worsen. . If you are having a medical emergency, call 911.   [facility] / e-Visit: [email or web address]         [facility]: [facility]: [id number]                   [facility]: [phone]

## 2019-07-21 NOTE — Progress Notes (Signed)
[facility] Work  Clinical Social Work met with patient in infusion room.  Mauro shared he was recently approved for Medicaid.  Patient updated CSW on recent goals he has been working on with his mentor, Elvera Lennox.  He plans to go to [facility] to obtain his GED- he asked for CSW's guidance on obtaining goals and timing associated with treatment.  Patient shared details regarding his family dynamics and CSW explored patient's emotions/reactions to family conflict/stressors.  CSW discussed importance of processing stressors and developing formal support for healthy coping.  Patient plans to update CSW as he works toward his personal life goals.  Patient agreed to referral to [facility] counseling intern.  CSW provided patient with [employer] gift card to assist with food/gas.  Gwinda Maine, LCSW  Clinical Social Worker [facility]

## 2019-07-22 ENCOUNTER — Telehealth: Payer: Self-pay | Admitting: Internal Medicine

## 2019-07-22 NOTE — Telephone Encounter (Signed)
Scheduled per los. Called and left msg. Mailed printout  °

## 2019-07-22 NOTE — Progress Notes (Signed)
Counseling intern left voicemail for patient on 07/22/19. Called pt to offer counseling services after referral from Gwinda Maine LCSW. CI requested pt return my call. CI will call pt again on Friday 07/24/19 if no return call is received.   Art [facility] Counseling Intern Voicemail:  [phone]

## 2019-07-27 NOTE — Progress Notes (Signed)
Called patient on 07/27/19 to inquire about scheduling a counseling session. Pt was referred to counseling by Gwinda Maine LCSW. Counseling intern informed pt about benefits and structure of counseling. Pt stated he would like to schedule a counseling session.   Intake Counseling Session: Wednesday, August 12, 2019 at 9:15am via phone  [facility] Counseling Intern Voicemail:  [phone]

## 2019-07-31 ENCOUNTER — Ambulatory Visit (HOSPITAL_COMMUNITY)
Admission: RE | Admit: 2019-07-31 | Discharge: 2019-07-31 | Disposition: A | Discharge status: Home / Self Care | Payer: Medicaid Other | Source: Ambulatory Visit | Attending: Physician Assistant | Admitting: Physician Assistant

## 2019-07-31 ENCOUNTER — Other Ambulatory Visit: Payer: Self-pay

## 2019-07-31 DIAGNOSIS — C8111 Nodular sclerosis classical Hodgkin lymphoma, lymph nodes of head, face, and neck: Secondary | ICD-10-CM | POA: Insufficient documentation

## 2019-07-31 LAB — GLUCOSE, CAPILLARY: Glucose-Capillary: 89 mg/dL (ref 70–99)

## 2019-07-31 MED ORDER — FLUDEOXYGLUCOSE F - 18 (FDG) INJECTION
11.7000 | Freq: Once | INTRAVENOUS | Status: AC | PRN
  Administered 2019-07-31: 11.7 via INTRAVENOUS

## 2019-08-05 ENCOUNTER — Inpatient Hospital Stay: Payer: Medicaid Other

## 2019-08-05 ENCOUNTER — Encounter: Payer: Self-pay | Admitting: Internal Medicine

## 2019-08-05 ENCOUNTER — Inpatient Hospital Stay: Payer: Medicaid Other | Attending: Physician Assistant | Admitting: Internal Medicine

## 2019-08-05 ENCOUNTER — Other Ambulatory Visit: Payer: Self-pay

## 2019-08-05 VITALS — BP 120/68 | HR 60 | Temp 97.8°F | Resp 17 | Ht 72.0 in | Wt 232.4 lb

## 2019-08-05 DIAGNOSIS — Z5111 Encounter for antineoplastic chemotherapy: Secondary | ICD-10-CM | POA: Diagnosis not present

## 2019-08-05 DIAGNOSIS — C8111 Nodular sclerosis classical Hodgkin lymphoma, lymph nodes of head, face, and neck: Secondary | ICD-10-CM

## 2019-08-05 DIAGNOSIS — D649 Anemia, unspecified: Secondary | ICD-10-CM

## 2019-08-05 DIAGNOSIS — C8118 Nodular sclerosis classical Hodgkin lymphoma, lymph nodes of multiple sites: Secondary | ICD-10-CM | POA: Diagnosis present

## 2019-08-05 DIAGNOSIS — R110 Nausea: Secondary | ICD-10-CM | POA: Diagnosis not present

## 2019-08-05 DIAGNOSIS — Z95828 Presence of other vascular implants and grafts: Secondary | ICD-10-CM

## 2019-08-05 LAB — CBC WITH DIFFERENTIAL (CANCER CENTER ONLY)
Abs Immature Granulocytes: 0.01 10*3/uL (ref 0.00–0.07)
Basophils Absolute: 0 10*3/uL (ref 0.0–0.1)
Basophils Relative: 0 %
Eosinophils Absolute: 0.3 10*3/uL (ref 0.0–0.5)
Eosinophils Relative: 5 %
HCT: 38.1 % — ABNORMAL LOW (ref 39.0–52.0)
Hemoglobin: 12.3 g/dL — ABNORMAL LOW (ref 13.0–17.0)
Immature Granulocytes: 0 %
Lymphocytes Relative: 42 %
Lymphs Abs: 2.4 10*3/uL (ref 0.7–4.0)
MCH: 26.7 pg (ref 26.0–34.0)
MCHC: 32.3 g/dL (ref 30.0–36.0)
MCV: 82.8 fL (ref 80.0–100.0)
Monocytes Absolute: 0.4 10*3/uL (ref 0.1–1.0)
Monocytes Relative: 7 %
Neutro Abs: 2.6 10*3/uL (ref 1.7–7.7)
Neutrophils Relative %: 46 %
Platelet Count: 176 10*3/uL (ref 150–400)
RBC: 4.6 MIL/uL (ref 4.22–5.81)
RDW: 19.8 % — ABNORMAL HIGH (ref 11.5–15.5)
WBC Count: 5.6 10*3/uL (ref 4.0–10.5)
nRBC: 0 % (ref 0.0–0.2)

## 2019-08-05 LAB — CMP (CANCER CENTER ONLY)
ALT: 19 U/L (ref 0–44)
AST: 13 U/L — ABNORMAL LOW (ref 15–41)
Albumin: 4.3 g/dL (ref 3.5–5.0)
Alkaline Phosphatase: 65 U/L (ref 38–126)
Anion gap: 8 (ref 5–15)
BUN: 14 mg/dL (ref 6–20)
CO2: 29 mmol/L (ref 22–32)
Calcium: 9 mg/dL (ref 8.9–10.3)
Chloride: 104 mmol/L (ref 98–111)
Creatinine: 0.71 mg/dL (ref 0.61–1.24)
GFR, Est AFR Am: >60 mL/min (ref >60)
GFR, Estimated: >60 mL/min (ref >60)
Glucose, Bld: 93 mg/dL (ref 70–99)
Potassium: 4.4 mmol/L (ref 3.5–5.1)
Sodium: 141 mmol/L (ref 135–145)
Total Bilirubin: <0.2 mg/dL — ABNORMAL LOW (ref 0.3–1.2)
Total Protein: 7.2 g/dL (ref 6.5–8.1)

## 2019-08-05 MED ORDER — SODIUM CHLORIDE 0.9 % IV SOLN
150.0000 mg | Freq: Once | INTRAVENOUS | Status: AC
  Administered 2019-08-05: 150 mg via INTRAVENOUS
  Filled 2019-08-05: qty 150

## 2019-08-05 MED ORDER — DOXORUBICIN HCL CHEMO IV INJECTION 2 MG/ML
25.0000 mg/m2 | Freq: Once | INTRAVENOUS | Status: AC
  Administered 2019-08-05: 15:00:00 56 mg via INTRAVENOUS
  Filled 2019-08-05: qty 28

## 2019-08-05 MED ORDER — DEXAMETHASONE SODIUM PHOSPHATE 10 MG/ML IJ SOLN
INTRAMUSCULAR | Status: AC
  Filled 2019-08-05: qty 1

## 2019-08-05 MED ORDER — DEXAMETHASONE SODIUM PHOSPHATE 10 MG/ML IJ SOLN
10.0000 mg | Freq: Once | INTRAMUSCULAR | Status: AC
  Administered 2019-08-05: 10 mg via INTRAVENOUS

## 2019-08-05 MED ORDER — SODIUM CHLORIDE 0.9% FLUSH
10.0000 mL | INTRAVENOUS | Status: DC | PRN
  Administered 2019-08-05: 10 mL
  Filled 2019-08-05: qty 10

## 2019-08-05 MED ORDER — SODIUM CHLORIDE 0.9 % IV SOLN
Freq: Once | INTRAVENOUS | Status: AC
  Filled 2019-08-05: qty 250

## 2019-08-05 MED ORDER — HEPARIN SOD (PORK) LOCK FLUSH 100 UNIT/ML IV SOLN
500.0000 [IU] | Freq: Once | INTRAVENOUS | Status: AC | PRN
  Administered 2019-08-05: 500 [IU]
  Filled 2019-08-05: qty 5

## 2019-08-05 MED ORDER — PALONOSETRON HCL INJECTION 0.25 MG/5ML
INTRAVENOUS | Status: AC
  Filled 2019-08-05: qty 5

## 2019-08-05 MED ORDER — SODIUM CHLORIDE 0.9 % IV SOLN
375.0000 mg/m2 | Freq: Once | INTRAVENOUS | Status: AC
  Administered 2019-08-05: 830 mg via INTRAVENOUS
  Filled 2019-08-05: qty 83

## 2019-08-05 MED ORDER — SODIUM CHLORIDE 0.9% FLUSH
10.0000 mL | INTRAVENOUS | Status: DC | PRN
  Administered 2019-08-05: 10 mL via INTRAVENOUS
  Filled 2019-08-05: qty 10

## 2019-08-05 MED ORDER — VINBLASTINE SULFATE CHEMO INJECTION 1 MG/ML
5.8500 mg/m2 | Freq: Once | INTRAVENOUS | Status: AC
  Administered 2019-08-05: 15:00:00 13 mg via INTRAVENOUS
  Filled 2019-08-05: qty 13

## 2019-08-05 MED ORDER — PALONOSETRON HCL INJECTION 0.25 MG/5ML
0.2500 mg | Freq: Once | INTRAVENOUS | Status: AC
  Administered 2019-08-05: 14:00:00 0.25 mg via INTRAVENOUS

## 2019-08-05 MED ORDER — SODIUM CHLORIDE 0.9 % IV SOLN
10.0000 [IU]/m2 | Freq: Once | INTRAVENOUS | Status: AC
  Administered 2019-08-05: 22 [IU] via INTRAVENOUS
  Filled 2019-08-05: qty 7.33

## 2019-08-05 NOTE — Progress Notes (Signed)
[place] [phone]   [phone]  OFFICE PROGRESS NOTE  Patient, No Pcp Per No address on file  DIAGNOSIS: Classical Hodgkin Lymphoma, Nodular Sclerosis subtype. He presented with bulky bilateral cervical lymphadenopathy, mediastinal lymphadenopathy, and mildly enlarged retroperitoneal periaortic adenopathy and iliac adenopathy. He also presented with involvement of the spleen. He was diagnosed in December 2020.   PRIOR THERAPY: None   CURRENT THERAPY: Systemic chemotherapy with doxorubicin, dacarbazine, vinblastine, and bleomycin on days 1 and 15 every 4 weeks.  First dose expected on January 7th, 2021.  Status post 2 cycles.  INTERVAL HISTORY: Terry Quinn 19 y.o. male returns to the clinic today for follow-up visit.  The patient is feeling fine today with no concerning complaints.  He denied having any current chest pain, shortness of breath, cough or hemoptysis.  He denied having any fever or chills.  He has no palpable lymphadenopathy.  He lost few pounds since his last visit.  He has no nausea, vomiting, diarrhea or constipation.  He has been tolerating his treatment with ABVD fairly well.  The patient had repeat PET scan performed after cycle #2 and he is here for evaluation and discussion of his discuss results and recommendation regarding his condition.  MEDICAL HISTORY:History reviewed. No pertinent past medical history.  ALLERGIES:  has No Known Allergies.  MEDICATIONS:  Current Outpatient Medications  Medication Sig Dispense Refill   allopurinol (ZYLOPRIM) 100 MG tablet Take 1 tablet (100 mg total) by mouth 2 (two) times daily. 60 tablet 2   lidocaine-prilocaine (EMLA) cream Apply 1 application topically as needed. (Patient not taking: Reported on 07/21/2019) 30 g 1   prochlorperazine (COMPAZINE) 10 MG tablet Take 1 tablet (10 mg total) by mouth every 6 (six) hours as needed for nausea or vomiting. 30 tablet 0   No  current facility-administered medications for this visit.    SURGICAL HISTORY:  Past Surgical History:  Procedure Laterality Date   IR IMAGING GUIDED PORT INSERTION  06/30/2019   IR US GUIDE BX ASP/DRAIN  05/27/2019    REVIEW OF SYSTEMS:  Constitutional: positive for weight loss Eyes: negative Ears, nose, mouth, throat, and face: negative Respiratory: negative Cardiovascular: negative Gastrointestinal: negative Genitourinary:negative Integument/breast: negative Hematologic/lymphatic: negative Musculoskeletal:negative Neurological: negative Behavioral/Psych: negative Endocrine: negative Allergic/Immunologic: negative   PHYSICAL EXAMINATION: General appearance: alert, cooperative and no distress Head: Normocephalic, without obvious abnormality, atraumatic Neck: no carotid bruit, no JVD and supple, symmetrical, trachea midline Lymph nodes: Cervical, supraclavicular, and axillary nodes normal. Resp: clear to auscultation bilaterally Back: symmetric, no curvature. ROM normal. No CVA tenderness. Cardio: regular rate and rhythm, S1, S2 normal, no murmur, click, rub or gallop GI: soft, non-tender; bowel sounds normal; no masses,  no organomegaly Extremities: extremities normal, atraumatic, no cyanosis or edema Neurologic: Alert and oriented X 3, normal strength and tone. Normal symmetric reflexes. Normal coordination and gait  ECOG PERFORMANCE STATUS: 1 - Symptomatic but completely ambulatory  Blood pressure 120/68, pulse 60, temperature 97.8 F (36.6 C), temperature source Oral, resp. rate 17, height 6' (1.829 m), weight 232 lb 6.4 oz (105.4 kg), SpO2 100 %.  LABORATORY DATA: Lab Results  Component Value Date   WBC 5.6 08/05/2019   HGB 12.3 (L) 08/05/2019   HCT 38.1 (L) 08/05/2019   MCV 82.8 08/05/2019   PLT 176 08/05/2019      Chemistry      Component Value Date/Time   NA 142 07/21/2019 1017   K 4.2 07/21/2019 1017  CL 107 07/21/2019 1017   CO2 27 07/21/2019 1017     BUN 11 07/21/2019 1017   CREATININE 0.66 07/21/2019 1017      Component Value Date/Time   CALCIUM 8.7 (L) 07/21/2019 1017   ALKPHOS 61 07/21/2019 1017   AST 12 (L) 07/21/2019 1017   ALT 15 07/21/2019 1017   BILITOT 0.2 (L) 07/21/2019 1017       RADIOGRAPHIC STUDIES: NM PET Image Restag (PS) Skull Base To Thigh  Result Date: 07/31/2019 CLINICAL DATA:  Subsequent treatment strategy for Hodgkin's disease. EXAM: NUCLEAR MEDICINE PET SKULL BASE TO THIGH TECHNIQUE: 11.7 mCi F-18 FDG was injected intravenously. Full-ring PET imaging was performed from the skull base to thigh after the radiotracer. CT data was obtained and used for attenuation correction and anatomic localization. Fasting blood glucose: 89 mg/dl COMPARISON:  PET-CT 05/22/2019 FINDINGS: Mediastinal blood pool activity: SUV max 2.1 Liver activity: SUV max 3.3 NECK: Interval marked improvement in the previously demonstrated extensive hypermetabolic mid to inferior cervical adenopathy bilaterally. The remaining lymph nodes are significantly smaller and without residual hypermetabolic activity.There is mild symmetric hypermetabolic activity within the lymphoid tissue of Waldeyer's ring, likely physiologic. Incidental CT findings: none CHEST: The previously demonstrated hypermetabolic mediastinal, hilar and left axillary adenopathy has also resolved. A prevascular node measuring 2.1 cm on image 64/4 has an SUV max of 2.4. Previously, this measured 3.2 cm and had an SUV max of 20.0. The pleural based nodularity anteriorly in the lower right hemithorax has resolved. There is no suspicious pulmonary activity or nodularity. Incidental CT findings: Right IJ Port-A-Cath extends to the superior cavoatrial junction. Small pericardial effusion has nearly completely resolved. Bilateral gynecomastia. ABDOMEN/PELVIS: The previously demonstrated hypermetabolic splenic lesions have resolved. Likewise, the hypermetabolic adenopathy in the abdomen and left  pelvis has resolved. There are small residual hypermetabolic right inguinal lymph nodes with an SUV max of up to 6.0. No abnormal metabolic activity within the liver or adrenal glands. Incidental CT findings: Improved hepatic steatosis. No acute findings. SKELETON: Previously demonstrated diffuse metabolic activity throughout the bone marrow has resolved. No focal abnormalities are identified. Incidental CT findings: none IMPRESSION: 1. Marked improvement in the previously demonstrated hypermetabolic activity throughout the neck, chest, abdomen and pelvis. The lymph nodes have decreased in size with near complete resolution of the hypermetabolic activity. This is mostly Deauville 2, although there are residual small hypermetabolic right inguinal lymph nodes (Deauville 4) which could be reactive. 2. No residual hypermetabolic activity in the spleen, right pleural space or bone marrow. 3. No new findings. Electronically Signed   By: Richardean Sale M.D.   On: 07/31/2019 16:31    ASSESSMENT AND PLAN:  This is a very pleasant 19 year old male diagnosed with Classical Hodgkin Lymphoma, Nodular Sclerosis subtype. He presented with bulky bilateral cervical lymphadenopathy, mediastinal lymphadenopathy, and mildly enlarged retroperitoneal periaortic adenopathy and iliac adenopathy. He also presented with involvement of the spleen. He was diagnosed in December 2020.  The patient is currently undergoing treatment with ABVD status post 2 cycles.  He has been tolerating his treatment well with no concerning adverse effects. He noticed significant improvement in his symptoms after starting the chemotherapy. He had repeat PET scan performed recently.  I personally and independently reviewed the scan images and discussed the result and showed the images to the patient today. Has a scan showed marked improvement in the previously demonstrated hypermetabolic activity in the neck, chest, abdomen and pelvis with Deauville 2. I  recommended for the patient  to proceed with cycle #3 today with ABVD.  Starting cycle #4 we will remove the bleomycin from his treatment regimen. The patient will come back for follow-up visit in 2 weeks for evaluation before day 15 of cycle #3. He was advised to call immediately if he has any concerning symptoms in the interval. The patient voices understanding of current disease status and treatment options and is in agreement with the current care plan.  All questions were answered. The patient knows to call the clinic with any problems, questions or concerns. We can certainly see the patient much sooner if necessary.  Disclaimer: This note was dictated with voice recognition software. Similar sounding words can inadvertently be transcribed and may not be corrected upon review.

## 2019-08-05 NOTE — Patient Instructions (Signed)

## 2019-08-05 NOTE — Patient Instructions (Signed)
[facility] Discharge Instructions for Patients Receiving Chemotherapy  Today you received the following chemotherapy agents :  Doxorubicin, Vinblastine, Bleomycin, Dacarbazine.  To help prevent nausea and vomiting after your treatment, we encourage you to take your nausea medication as prescribed.   If you develop nausea and vomiting that is not controlled by your nausea medication, call the clinic.   BELOW ARE SYMPTOMS THAT SHOULD BE REPORTED IMMEDIATELY:  *FEVER GREATER THAN 100.5 F  *CHILLS WITH OR WITHOUT FEVER  NAUSEA AND VOMITING THAT IS NOT CONTROLLED WITH YOUR NAUSEA MEDICATION  *UNUSUAL SHORTNESS OF BREATH  *UNUSUAL BRUISING OR BLEEDING  TENDERNESS IN MOUTH AND THROAT WITH OR WITHOUT PRESENCE OF ULCERS  *URINARY PROBLEMS  *BOWEL PROBLEMS  UNUSUAL RASH Items with * indicate a potential emergency and should be followed up as soon as possible.  Feel free to call the clinic should you have any questions or concerns. The clinic phone number is [phone].  Please show the [facility] at check-in to the Emergency Department and triage nurse.

## 2019-08-12 NOTE — Progress Notes (Signed)
Counseling intern left patient voicemail on 08/12/19. CI called pt for a scheduled counseling session via phone, but pt's phone went straight to voicemail. CI's message encouraged pt to call to reschedule the session. CI will check-in with pt on 08/14/19 if no return call is received.   Verlan Friends [place] Counseling Intern Voicemail: [phone]

## 2019-08-17 NOTE — Progress Notes (Signed)
[facility] OFFICE PROGRESS NOTE  Patient, No Pcp Per No address on file  DIAGNOSIS: Classical Hodgkin Lymphoma, Nodular Sclerosis subtype. He presented with bulky bilateral cervical lymphadenopathy, mediastinal lymphadenopathy, and mildly enlarged retroperitoneal periaortic adenopathy and iliac adenopathy. He also presented with involvement of the spleen. He was diagnosed in December 2020.  PRIOR THERAPY: None   CURRENT THERAPY: Systemic chemotherapy with doxorubicin, dacarbazine, vinblastine, and bleomycin on days 1 and 15 every 4 weeks. First dose expected on January 7th, 2021.Statuspostday 1 cycle 3.   INTERVAL HISTORY: Terry Quinn 18 y.o. male returns to the clinic for a follow up visit. The patient is feeling well today without any concerning complaints. The patient continues to tolerate treatment with chemotherapy well without any adverse side effects. Denies any fever, chills, night sweats, or weight loss. Denies any chest pain, shortness of breath, cough, or hemoptysis. Denies any nausea, vomiting, diarrhea, or constipation. Denies any headache or visual changes. Denies any rashes or skin changes. He denies any peripheral neuropathy.He denies any recent signs and symptoms of infection. He denies any abnormal bleeding or bruising including melena, hematochezia, hematuria, epistaxis, or gingival bleeding.  The patient is here today for evaluation prior to starting cycle # day 15 cycle 3  MEDICAL HISTORY:No past medical history on file.  ALLERGIES:  has No Known Allergies.  MEDICATIONS:  Current Outpatient Medications  Medication Sig Dispense Refill  . allopurinol (ZYLOPRIM) 100 MG tablet Take 1 tablet (100 mg total) by mouth 2 (two) times daily. 60 tablet 2  . lidocaine-prilocaine (EMLA) cream Apply 1 application topically as needed. 30 g 1  . prochlorperazine (COMPAZINE) 10 MG tablet Take 1 tablet (10 mg total) by mouth every 6 (six) hours as needed for  nausea or vomiting. 30 tablet 0   No current facility-administered medications for this visit.    SURGICAL HISTORY:  Past Surgical History:  Procedure Laterality Date  . IR IMAGING GUIDED PORT INSERTION  06/30/2019  . IR US GUIDE BX ASP/DRAIN  05/27/2019    REVIEW OF SYSTEMS:   Review of Systems  Constitutional: Negative for appetite change, chills, fatigue, fever and unexpected weight change.  HENT: Negative for mouth sores, nosebleeds, sore throat and trouble swallowing.   Eyes: Negative for eye problems and icterus.  Respiratory: Negative for cough, hemoptysis, shortness of breath and wheezing.  Cardiovascular: Negative for chest pain and leg swelling.  Gastrointestinal: Negative for abdominal pain, constipation, diarrhea, nausea and vomiting.  Genitourinary: Negative for bladder incontinence, difficulty urinating, dysuria, frequency and hematuria.   Musculoskeletal: Negative for back pain, gait problem, neck pain and neck stiffness.  Skin: Negative for itching and rash.  Neurological: Negative for dizziness, extremity weakness, gait problem, headaches, light-headedness and seizures.  Hematological: Negative for adenopathy. Does not bruise/bleed easily.  Psychiatric/Behavioral: Negative for confusion, depression and sleep disturbance. The patient is not nervous/anxious.     PHYSICAL EXAMINATION:  Blood pressure (!) 122/53, pulse (!) 59, temperature 98.2 F (36.8 C), temperature source Temporal, resp. rate 18, height 6' (1.829 m), weight 245 lb 9.6 oz (111.4 kg), SpO2 100 %.  ECOG PERFORMANCE STATUS: 1 - Symptomatic but completely ambulatory  Physical Exam  Constitutional: Oriented to person, place, and time and well-developed, well-nourished, and in no distress.  HENT:  Head: Normocephalic and atraumatic.  Mouth/Throat: Oropharynx is clear and moist. No oropharyngeal exudate.  Eyes: Conjunctivae are normal. Right eye exhibits no discharge. Left eye exhibits no discharge. No  scleral icterus.  Neck: Normal range of  motion. Neck supple.  Cardiovascular: Normal rate, regular rhythm, normal heart sounds and intact distal pulses.   Pulmonary/Chest: Effort normal and breath sounds normal. No respiratory distress. No wheezes. No rales.  Abdominal: Soft. Bowel sounds are normal. Exhibits no distension and no mass. There is no tenderness.  Musculoskeletal: Normal range of motion. Exhibits no edema.  Lymphadenopathy:    No cervical adenopathy.  Neurological: Alert and oriented to person, place, and time. Exhibits normal muscle tone. Gait normal. Coordination normal.  Skin: Skin is warm and dry. No rash noted. Not diaphoretic. No erythema. No pallor.  Psychiatric: Mood, memory and judgment normal.  Vitals reviewed.  LABORATORY DATA: Lab Results  Component Value Date   WBC 5.1 08/18/2019   HGB 11.5 (L) 08/18/2019   HCT 35.6 (L) 08/18/2019   MCV 84.6 08/18/2019   PLT 169 08/18/2019      Chemistry      Component Value Date/Time   NA 142 08/18/2019 0949   K 3.9 08/18/2019 0949   CL 106 08/18/2019 0949   CO2 27 08/18/2019 0949   BUN 12 08/18/2019 0949   CREATININE 0.68 08/18/2019 0949      Component Value Date/Time   CALCIUM 8.6 (L) 08/18/2019 0949   ALKPHOS 55 08/18/2019 0949   AST 14 (L) 08/18/2019 0949   ALT 18 08/18/2019 0949   BILITOT <0.2 (L) 08/18/2019 0949       RADIOGRAPHIC STUDIES:  NM PET Image Restag (PS) Skull Base To Thigh  Result Date: 07/31/2019 CLINICAL DATA:  Subsequent treatment strategy for Hodgkin's disease. EXAM: NUCLEAR MEDICINE PET SKULL BASE TO THIGH TECHNIQUE: 11.7 mCi F-18 FDG was injected intravenously. Full-ring PET imaging was performed from the skull base to thigh after the radiotracer. CT data was obtained and used for attenuation correction and anatomic localization. Fasting blood glucose: 89 mg/dl COMPARISON:  PET-CT 05/22/2019 FINDINGS: Mediastinal blood pool activity: SUV max 2.1 Liver activity: SUV max 3.3 NECK:  Interval marked improvement in the previously demonstrated extensive hypermetabolic mid to inferior cervical adenopathy bilaterally. The remaining lymph nodes are significantly smaller and without residual hypermetabolic activity.There is mild symmetric hypermetabolic activity within the lymphoid tissue of Waldeyer's ring, likely physiologic. Incidental CT findings: none CHEST: The previously demonstrated hypermetabolic mediastinal, hilar and left axillary adenopathy has also resolved. A prevascular node measuring 2.1 cm on image 64/4 has an SUV max of 2.4. Previously, this measured 3.2 cm and had an SUV max of 20.0. The pleural based nodularity anteriorly in the lower right hemithorax has resolved. There is no suspicious pulmonary activity or nodularity. Incidental CT findings: Right IJ Port-A-Cath extends to the superior cavoatrial junction. Small pericardial effusion has nearly completely resolved. Bilateral gynecomastia. ABDOMEN/PELVIS: The previously demonstrated hypermetabolic splenic lesions have resolved. Likewise, the hypermetabolic adenopathy in the abdomen and left pelvis has resolved. There are small residual hypermetabolic right inguinal lymph nodes with an SUV max of up to 6.0. No abnormal metabolic activity within the liver or adrenal glands. Incidental CT findings: Improved hepatic steatosis. No acute findings. SKELETON: Previously demonstrated diffuse metabolic activity throughout the bone marrow has resolved. No focal abnormalities are identified. Incidental CT findings: none IMPRESSION: 1. Marked improvement in the previously demonstrated hypermetabolic activity throughout the neck, chest, abdomen and pelvis. The lymph nodes have decreased in size with near complete resolution of the hypermetabolic activity. This is mostly Deauville 2, although there are residual small hypermetabolic right inguinal lymph nodes (Deauville 4) which could be reactive. 2. No residual hypermetabolic activity in the  spleen, right pleural space or bone marrow. 3. No new findings. Electronically Signed   By: Richardean Sale M.D.   On: 07/31/2019 16:31     ASSESSMENT/PLAN:  This is a very pleasant 19 year old male diagnosed with classical Hodgkin's lymphoma, nodular sclerosing subtype. He presented with bulky bilateral cervical lymphadenopathy, mediastinal lymphadenopathy, and mildly enlarged retroperitoneal,periaortic, and iliac adenopathy. He also presents with spleen involvement. He was diagnosed in December 2020  The patient is currently undergoing systemic chemotherapy with ABVD (doxorubicin, dacarbazine, vinblastine, and bleomycin) on days 1 and 15 every 4 weeks. He is status post day 1 cycle 3. He is tolerating treatment well except for mild nausea with cycle #1 which is controlled with his anti-emetic.    The patient was seen with Dr. Julien Nordmann today. Labs were reviewed. Recommend that he proceed with cycle #3 day 15 today as scheduled.   Starting from cycle #4, bleomycin will be dropped.   We will see him back for a follow up visit in 2 weeks for evaluation before starting cycle #4.   The patient was advised to call immediately if she has any concerning symptoms in the interval. The patient voices understanding of current disease status and treatment options and is in agreement with the current care plan. All questions were answered. The patient knows to call the clinic with any problems, questions or concerns. We can certainly see the patient much sooner if necessary    No orders of the defined types were placed in this encounter.    Terry Naval L Meghin Thivierge, PA-C 08/18/19

## 2019-08-18 ENCOUNTER — Inpatient Hospital Stay: Payer: Medicaid Other

## 2019-08-18 ENCOUNTER — Other Ambulatory Visit: Payer: Self-pay

## 2019-08-18 ENCOUNTER — Inpatient Hospital Stay (HOSPITAL_BASED_OUTPATIENT_CLINIC_OR_DEPARTMENT_OTHER): Payer: Medicaid Other | Admitting: Physician Assistant

## 2019-08-18 VITALS — BP 122/53 | HR 59 | Temp 98.2°F | Resp 18 | Ht 72.0 in | Wt 245.6 lb

## 2019-08-18 DIAGNOSIS — Z5111 Encounter for antineoplastic chemotherapy: Secondary | ICD-10-CM | POA: Diagnosis not present

## 2019-08-18 DIAGNOSIS — C8111 Nodular sclerosis classical Hodgkin lymphoma, lymph nodes of head, face, and neck: Secondary | ICD-10-CM

## 2019-08-18 DIAGNOSIS — Z95828 Presence of other vascular implants and grafts: Secondary | ICD-10-CM

## 2019-08-18 LAB — CBC WITH DIFFERENTIAL (CANCER CENTER ONLY)
Abs Immature Granulocytes: 0.01 10*3/uL (ref 0.00–0.07)
Basophils Absolute: 0 10*3/uL (ref 0.0–0.1)
Basophils Relative: 1 %
Eosinophils Absolute: 0.3 10*3/uL (ref 0.0–0.5)
Eosinophils Relative: 6 %
HCT: 35.6 % — ABNORMAL LOW (ref 39.0–52.0)
Hemoglobin: 11.5 g/dL — ABNORMAL LOW (ref 13.0–17.0)
Immature Granulocytes: 0 %
Lymphocytes Relative: 40 %
Lymphs Abs: 2.1 10*3/uL (ref 0.7–4.0)
MCH: 27.3 pg (ref 26.0–34.0)
MCHC: 32.3 g/dL (ref 30.0–36.0)
MCV: 84.6 fL (ref 80.0–100.0)
Monocytes Absolute: 0.4 10*3/uL (ref 0.1–1.0)
Monocytes Relative: 7 %
Neutro Abs: 2.4 10*3/uL (ref 1.7–7.7)
Neutrophils Relative %: 46 %
Platelet Count: 169 10*3/uL (ref 150–400)
RBC: 4.21 MIL/uL — ABNORMAL LOW (ref 4.22–5.81)
RDW: 18.8 % — ABNORMAL HIGH (ref 11.5–15.5)
WBC Count: 5.1 10*3/uL (ref 4.0–10.5)
nRBC: 0 % (ref 0.0–0.2)

## 2019-08-18 LAB — CMP (CANCER CENTER ONLY)
ALT: 18 U/L (ref 0–44)
AST: 14 U/L — ABNORMAL LOW (ref 15–41)
Albumin: 3.8 g/dL (ref 3.5–5.0)
Alkaline Phosphatase: 55 U/L (ref 38–126)
Anion gap: 9 (ref 5–15)
BUN: 12 mg/dL (ref 6–20)
CO2: 27 mmol/L (ref 22–32)
Calcium: 8.6 mg/dL — ABNORMAL LOW (ref 8.9–10.3)
Chloride: 106 mmol/L (ref 98–111)
Creatinine: 0.68 mg/dL (ref 0.61–1.24)
GFR, Est AFR Am: >60 mL/min (ref >60)
GFR, Estimated: >60 mL/min (ref >60)
Glucose, Bld: 94 mg/dL (ref 70–99)
Potassium: 3.9 mmol/L (ref 3.5–5.1)
Sodium: 142 mmol/L (ref 135–145)
Total Bilirubin: <0.2 mg/dL — ABNORMAL LOW (ref 0.3–1.2)
Total Protein: 6.3 g/dL — ABNORMAL LOW (ref 6.5–8.1)

## 2019-08-18 MED ORDER — HEPARIN SOD (PORK) LOCK FLUSH 100 UNIT/ML IV SOLN
500.0000 [IU] | Freq: Once | INTRAVENOUS | Status: DC
  Filled 2019-08-18: qty 5

## 2019-08-18 MED ORDER — VINBLASTINE SULFATE CHEMO INJECTION 1 MG/ML
5.8500 mg/m2 | Freq: Once | INTRAVENOUS | Status: AC
  Administered 2019-08-18: 13 mg via INTRAVENOUS
  Filled 2019-08-18: qty 13

## 2019-08-18 MED ORDER — DEXAMETHASONE SODIUM PHOSPHATE 10 MG/ML IJ SOLN
INTRAMUSCULAR | Status: AC
  Filled 2019-08-18: qty 1

## 2019-08-18 MED ORDER — SODIUM CHLORIDE 0.9 % IV SOLN
Freq: Once | INTRAVENOUS | Status: AC
  Filled 2019-08-18: qty 250

## 2019-08-18 MED ORDER — PALONOSETRON HCL INJECTION 0.25 MG/5ML
0.2500 mg | Freq: Once | INTRAVENOUS | Status: AC
  Administered 2019-08-18: 11:00:00 0.25 mg via INTRAVENOUS

## 2019-08-18 MED ORDER — DEXAMETHASONE SODIUM PHOSPHATE 10 MG/ML IJ SOLN
10.0000 mg | Freq: Once | INTRAMUSCULAR | Status: AC
  Administered 2019-08-18: 11:00:00 10 mg via INTRAVENOUS

## 2019-08-18 MED ORDER — DOXORUBICIN HCL CHEMO IV INJECTION 2 MG/ML
25.0000 mg/m2 | Freq: Once | INTRAVENOUS | Status: AC
  Administered 2019-08-18: 12:00:00 56 mg via INTRAVENOUS
  Filled 2019-08-18: qty 28

## 2019-08-18 MED ORDER — SODIUM CHLORIDE 0.9% FLUSH
10.0000 mL | INTRAVENOUS | Status: DC | PRN
  Administered 2019-08-18: 10 mL via INTRAVENOUS
  Filled 2019-08-18: qty 10

## 2019-08-18 MED ORDER — HEPARIN SOD (PORK) LOCK FLUSH 100 UNIT/ML IV SOLN
500.0000 [IU] | Freq: Once | INTRAVENOUS | Status: AC | PRN
  Administered 2019-08-18: 500 [IU]
  Filled 2019-08-18: qty 5

## 2019-08-18 MED ORDER — SODIUM CHLORIDE 0.9 % IV SOLN
375.0000 mg/m2 | Freq: Once | INTRAVENOUS | Status: AC
  Administered 2019-08-18: 13:00:00 830 mg via INTRAVENOUS
  Filled 2019-08-18: qty 83

## 2019-08-18 MED ORDER — SODIUM CHLORIDE 0.9 % IV SOLN
150.0000 mg | Freq: Once | INTRAVENOUS | Status: AC
  Administered 2019-08-18: 11:00:00 150 mg via INTRAVENOUS
  Filled 2019-08-18: qty 150

## 2019-08-18 MED ORDER — PALONOSETRON HCL INJECTION 0.25 MG/5ML
INTRAVENOUS | Status: AC
  Filled 2019-08-18: qty 5

## 2019-08-18 MED ORDER — SODIUM CHLORIDE 0.9% FLUSH
10.0000 mL | INTRAVENOUS | Status: DC | PRN
  Administered 2019-08-18: 14:00:00 10 mL
  Filled 2019-08-18: qty 10

## 2019-08-18 MED ORDER — SODIUM CHLORIDE 0.9 % IV SOLN
10.0000 [IU]/m2 | Freq: Once | INTRAVENOUS | Status: AC
  Administered 2019-08-18: 13:00:00 22 [IU] via INTRAVENOUS
  Filled 2019-08-18: qty 7.33

## 2019-08-18 NOTE — Patient Instructions (Signed)
[facility] Discharge Instructions for Patients Receiving Chemotherapy  Today you received the following chemotherapy agents:  Adriamycin, Vinblastine, Bleomycin, Dacarbazine  To help prevent nausea and vomiting after your treatment, we encourage you to take your nausea medication as prescribed.   If you develop nausea and vomiting that is not controlled by your nausea medication, call the clinic.   BELOW ARE SYMPTOMS THAT SHOULD BE REPORTED IMMEDIATELY:  *FEVER GREATER THAN 100.5 F  *CHILLS WITH OR WITHOUT FEVER  NAUSEA AND VOMITING THAT IS NOT CONTROLLED WITH YOUR NAUSEA MEDICATION  *UNUSUAL SHORTNESS OF BREATH  *UNUSUAL BRUISING OR BLEEDING  TENDERNESS IN MOUTH AND THROAT WITH OR WITHOUT PRESENCE OF ULCERS  *URINARY PROBLEMS  *BOWEL PROBLEMS  UNUSUAL RASH Items with * indicate a potential emergency and should be followed up as soon as possible.  Feel free to call the clinic should you have any questions or concerns. The clinic phone number is [phone].  Please show the [facility] at check-in to the Emergency Department and triage nurse.

## 2019-08-26 NOTE — Progress Notes (Signed)
Pharmacist Chemotherapy Monitoring - Follow Up Assessment    I verify that I have reviewed each item in the below checklist:  . Regimen for the patient is scheduled for the appropriate day and plan matches scheduled date. Marland Kitchen Appropriate non-routine labs are ordered dependent on drug ordered. . If applicable, additional medications reviewed and ordered per protocol based on lifetime cumulative doses and/or treatment regimen.   Plan for follow-up and/or issues identified: Yes . I-vent associated with next due treatment: Yes . MD and/or nursing notified: No    Terry Quinn, Pharm.D., CPP 08/26/2019@3 :13 PM

## 2019-09-01 ENCOUNTER — Inpatient Hospital Stay: Payer: Medicaid Other

## 2019-09-01 ENCOUNTER — Inpatient Hospital Stay (HOSPITAL_BASED_OUTPATIENT_CLINIC_OR_DEPARTMENT_OTHER): Payer: Medicaid Other | Admitting: Internal Medicine

## 2019-09-01 ENCOUNTER — Other Ambulatory Visit: Payer: Self-pay

## 2019-09-01 ENCOUNTER — Encounter: Payer: Self-pay | Admitting: Internal Medicine

## 2019-09-01 VITALS — BP 115/72 | HR 66 | Temp 98.3°F | Resp 18 | Ht 72.0 in | Wt 241.4 lb

## 2019-09-01 DIAGNOSIS — C8111 Nodular sclerosis classical Hodgkin lymphoma, lymph nodes of head, face, and neck: Secondary | ICD-10-CM | POA: Diagnosis not present

## 2019-09-01 DIAGNOSIS — Z5111 Encounter for antineoplastic chemotherapy: Secondary | ICD-10-CM | POA: Diagnosis not present

## 2019-09-01 LAB — CMP (CANCER CENTER ONLY)
ALT: 18 U/L (ref 0–44)
AST: 14 U/L — ABNORMAL LOW (ref 15–41)
Albumin: 4.3 g/dL (ref 3.5–5.0)
Alkaline Phosphatase: 61 U/L (ref 38–126)
Anion gap: 10 (ref 5–15)
BUN: 22 mg/dL — ABNORMAL HIGH (ref 6–20)
CO2: 25 mmol/L (ref 22–32)
Calcium: 9.3 mg/dL (ref 8.9–10.3)
Chloride: 103 mmol/L (ref 98–111)
Creatinine: 0.76 mg/dL (ref 0.61–1.24)
GFR, Est AFR Am: >60 mL/min (ref >60)
GFR, Estimated: >60 mL/min (ref >60)
Glucose, Bld: 90 mg/dL (ref 70–99)
Potassium: 4.5 mmol/L (ref 3.5–5.1)
Sodium: 138 mmol/L (ref 135–145)
Total Bilirubin: <0.2 mg/dL — ABNORMAL LOW (ref 0.3–1.2)
Total Protein: 7 g/dL (ref 6.5–8.1)

## 2019-09-01 LAB — CBC WITH DIFFERENTIAL (CANCER CENTER ONLY)
Abs Immature Granulocytes: 0.03 10*3/uL (ref 0.00–0.07)
Basophils Absolute: 0 10*3/uL (ref 0.0–0.1)
Basophils Relative: 1 %
Eosinophils Absolute: 0.3 10*3/uL (ref 0.0–0.5)
Eosinophils Relative: 5 %
HCT: 38.3 % — ABNORMAL LOW (ref 39.0–52.0)
Hemoglobin: 12.9 g/dL — ABNORMAL LOW (ref 13.0–17.0)
Immature Granulocytes: 1 %
Lymphocytes Relative: 37 %
Lymphs Abs: 2.2 10*3/uL (ref 0.7–4.0)
MCH: 28.4 pg (ref 26.0–34.0)
MCHC: 33.7 g/dL (ref 30.0–36.0)
MCV: 84.4 fL (ref 80.0–100.0)
Monocytes Absolute: 0.6 10*3/uL (ref 0.1–1.0)
Monocytes Relative: 10 %
Neutro Abs: 2.8 10*3/uL (ref 1.7–7.7)
Neutrophils Relative %: 46 %
Platelet Count: 215 10*3/uL (ref 150–400)
RBC: 4.54 MIL/uL (ref 4.22–5.81)
RDW: 18.4 % — ABNORMAL HIGH (ref 11.5–15.5)
WBC Count: 5.9 10*3/uL (ref 4.0–10.5)
nRBC: 0 % (ref 0.0–0.2)

## 2019-09-01 MED ORDER — SODIUM CHLORIDE 0.9% FLUSH
10.0000 mL | INTRAVENOUS | Status: DC | PRN
  Administered 2019-09-01: 14:00:00 10 mL
  Filled 2019-09-01: qty 10

## 2019-09-01 MED ORDER — DEXAMETHASONE SODIUM PHOSPHATE 10 MG/ML IJ SOLN
10.0000 mg | Freq: Once | INTRAMUSCULAR | Status: AC
  Administered 2019-09-01: 11:00:00 10 mg via INTRAVENOUS

## 2019-09-01 MED ORDER — PROCHLORPERAZINE MALEATE 10 MG PO TABS: 10.0000 mg | ORAL_TABLET | Freq: Four times a day (QID) | ORAL | 0 refills | Status: DC | PRN

## 2019-09-01 MED ORDER — PALONOSETRON HCL INJECTION 0.25 MG/5ML
0.2500 mg | Freq: Once | INTRAVENOUS | Status: AC
  Administered 2019-09-01: 11:00:00 0.25 mg via INTRAVENOUS

## 2019-09-01 MED ORDER — SODIUM CHLORIDE 0.9 % IV SOLN
Freq: Once | INTRAVENOUS | Status: AC
  Filled 2019-09-01: qty 250

## 2019-09-01 MED ORDER — VINBLASTINE SULFATE CHEMO INJECTION 1 MG/ML
5.8500 mg/m2 | Freq: Once | INTRAVENOUS | Status: AC
  Administered 2019-09-01: 12:00:00 13 mg via INTRAVENOUS
  Filled 2019-09-01: qty 13

## 2019-09-01 MED ORDER — SODIUM CHLORIDE 0.9 % IV SOLN
375.0000 mg/m2 | Freq: Once | INTRAVENOUS | Status: AC
  Administered 2019-09-01: 13:00:00 830 mg via INTRAVENOUS
  Filled 2019-09-01: qty 83

## 2019-09-01 MED ORDER — PALONOSETRON HCL INJECTION 0.25 MG/5ML
INTRAVENOUS | Status: AC
  Filled 2019-09-01: qty 5

## 2019-09-01 MED ORDER — DOXORUBICIN HCL CHEMO IV INJECTION 2 MG/ML
25.0000 mg/m2 | Freq: Once | INTRAVENOUS | Status: AC
  Administered 2019-09-01: 12:00:00 56 mg via INTRAVENOUS
  Filled 2019-09-01: qty 28

## 2019-09-01 MED ORDER — SODIUM CHLORIDE 0.9 % IV SOLN
150.0000 mg | Freq: Once | INTRAVENOUS | Status: AC
  Administered 2019-09-01: 150 mg via INTRAVENOUS
  Filled 2019-09-01: qty 150

## 2019-09-01 MED ORDER — HEPARIN SOD (PORK) LOCK FLUSH 100 UNIT/ML IV SOLN
500.0000 [IU] | Freq: Once | INTRAVENOUS | Status: AC | PRN
  Administered 2019-09-01: 14:00:00 500 [IU]
  Filled 2019-09-01: qty 5

## 2019-09-01 MED ORDER — DEXAMETHASONE SODIUM PHOSPHATE 10 MG/ML IJ SOLN
INTRAMUSCULAR | Status: AC
  Filled 2019-09-01: qty 1

## 2019-09-01 NOTE — Progress Notes (Signed)
[place] Telephone:([phone]) [phone]   Fax:(336) [phone]  OFFICE PROGRESS NOTE  Patient, No Pcp Per No address on file  DIAGNOSIS: Classical Hodgkin Lymphoma, Nodular Sclerosis subtype. He presented with bulky bilateral cervical lymphadenopathy, mediastinal lymphadenopathy, and mildly enlarged retroperitoneal periaortic adenopathy and iliac adenopathy. He also presented with involvement of the spleen. He was diagnosed in December 2020.   PRIOR THERAPY: None   CURRENT THERAPY: Systemic chemotherapy with doxorubicin, dacarbazine, vinblastine, and bleomycin on days 1 and 15 every 4 weeks.  First dose expected on January 7th, 2021.  Status post 3 cycles.  Bleomycin was discontinued after cycle #3.  INTERVAL HISTORY: Terry Quinn 19 y.o. male returns to the clinic today for follow-up visit.  The patient is feeling fine today with no concerning complaints except for mild itching in the palms.  He was Benadryl cream with some improvement.  He denied having any current chest pain, shortness of breath, cough or hemoptysis.  He denied having any fever or chills.  He has no nausea, vomiting, diarrhea or constipation.  He denied headache or visual changes.  He has been tolerating his systemic chemotherapy fairly well.  He is here today for evaluation before starting cycle #4.  MEDICAL HISTORY:No past medical history on file.  ALLERGIES:  has No Known Allergies.  MEDICATIONS:  Current Outpatient Medications  Medication Sig Dispense Refill  . allopurinol (ZYLOPRIM) 100 MG tablet Take 1 tablet (100 mg total) by mouth 2 (two) times daily. 60 tablet 2  . lidocaine-prilocaine (EMLA) cream Apply 1 application topically as needed. 30 g 1  . prochlorperazine (COMPAZINE) 10 MG tablet Take 1 tablet (10 mg total) by mouth every 6 (six) hours as needed for nausea or vomiting. 30 tablet 0   No current facility-administered medications for this visit.    SURGICAL HISTORY:  Past  Surgical History:  Procedure Laterality Date  . IR IMAGING GUIDED PORT INSERTION  06/30/2019  . IR US GUIDE BX ASP/DRAIN  05/27/2019    REVIEW OF SYSTEMS:  A comprehensive review of systems was negative except for: Integument/breast: positive for pruritus   PHYSICAL EXAMINATION: General appearance: alert, cooperative and no distress Head: Normocephalic, without obvious abnormality, atraumatic Neck: no carotid bruit, no JVD and supple, symmetrical, trachea midline Lymph nodes: Cervical, supraclavicular, and axillary nodes normal. Resp: clear to auscultation bilaterally Back: symmetric, no curvature. ROM normal. No CVA tenderness. Cardio: regular rate and rhythm, S1, S2 normal, no murmur, click, rub or gallop GI: soft, non-tender; bowel sounds normal; no masses,  no organomegaly Extremities: extremities normal, atraumatic, no cyanosis or edema  ECOG PERFORMANCE STATUS: 1 - Symptomatic but completely ambulatory  Blood pressure 115/72, pulse 66, temperature 98.3 F (36.8 C), temperature source Oral, resp. rate 18, height 6' (1.829 m), weight 241 lb 6.4 oz (109.5 kg), SpO2 100 %.  LABORATORY DATA: Lab Results  Component Value Date   WBC 5.9 09/01/2019   HGB 12.9 (L) 09/01/2019   HCT 38.3 (L) 09/01/2019   MCV 84.4 09/01/2019   PLT 215 09/01/2019      Chemistry      Component Value Date/Time   NA 142 08/18/2019 0949   K 3.9 08/18/2019 0949   CL 106 08/18/2019 0949   CO2 27 08/18/2019 0949   BUN 12 08/18/2019 0949   CREATININE 0.68 08/18/2019 0949      Component Value Date/Time   CALCIUM 8.6 (L) 08/18/2019 0949   ALKPHOS 55 08/18/2019 0949   AST 14 (  L) 08/18/2019 0949   ALT 18 08/18/2019 0949   BILITOT <0.2 (L) 08/18/2019 0949       RADIOGRAPHIC STUDIES: No results found.  ASSESSMENT AND PLAN:  This is a very pleasant 19 year old male diagnosed with Classical Hodgkin Lymphoma, Nodular Sclerosis subtype. He presented with bulky bilateral cervical lymphadenopathy,  mediastinal lymphadenopathy, and mildly enlarged retroperitoneal periaortic adenopathy and iliac adenopathy. He also presented with involvement of the spleen. He was diagnosed in December 2020.  The patient is currently undergoing treatment with ABVD status post 3 cycles.  He noticed significant improvement in his symptoms after starting the chemotherapy. Repeat PET scan after cycle #2 showed marked improvement in the previously demonstrated hypermetabolic activity in the neck, chest, abdomen and pelvis with Deauville 2. The patient has been tolerating his treatment fairly well with no concerning complaints. Starting from cycle #4 we will discontinue his treatment with bleomycin and the patient will continue the other component of his treatment with AVD every 2 weeks.  He will proceed with day 1 of cycle #4 today. I give him refill of Compazine. I will see him back for follow-up visit in 2 weeks for evaluation before the next dose of his treatment. For the itching of the palms, he was advised to use Benadryl as needed and also to keep his hands moist all the time. The patient was advised to call immediately if he has any concerning symptoms in the interval. The patient voices understanding of current disease status and treatment options and is in agreement with the current care plan.  All questions were answered. The patient knows to call the clinic with any problems, questions or concerns. We can certainly see the patient much sooner if necessary.  Disclaimer: This note was dictated with voice recognition software. Similar sounding words can inadvertently be transcribed and may not be corrected upon review.

## 2019-09-09 NOTE — Progress Notes (Signed)
Pharmacist Chemotherapy Monitoring - Follow Up Assessment    I verify that I have reviewed each item in the below checklist:  . Regimen for the patient is scheduled for the appropriate day and plan matches scheduled date. Marland Kitchen Appropriate non-routine labs are ordered dependent on drug ordered. . If applicable, additional medications reviewed and ordered per protocol based on lifetime cumulative doses and/or treatment regimen.   Plan for follow-up and/or issues identified: No . I-vent associated with next due treatment: No . MD and/or nursing notified: No  Philomena Course 09/09/2019 9:06 AM

## 2019-09-12 NOTE — Progress Notes (Deleted)
[place] OFFICE PROGRESS NOTE  Patient, No Pcp Per No address on file  DIAGNOSIS: Classical Hodgkin Lymphoma, Nodular Sclerosis subtype. He presented with bulky bilateral cervical lymphadenopathy, mediastinal lymphadenopathy, and mildly enlarged retroperitoneal periaortic adenopathy and iliac adenopathy. He also presented with involvement of the spleen. He was diagnosed in December 2020.  PRIOR THERAPY: None   CURRENT THERAPY: Systemic chemotherapy with doxorubicin, dacarbazine, vinblastine, and bleomycin on days 1 and 15 every 4 weeks. First dose expected on January 7th, 2021.Statuspostday 1 cycle 4.Starting from cycle #4, bleomycin has been dropped.   INTERVAL HISTORY: Terry Quinn 19 y.o. male returns to the clinic for a follow up visit. The patient is feeling well today without any concerning complaints. The patient continues to tolerate treatment with chemotherapy well without any adverse side effects. Denies any fever, chills, night sweats, or weight loss. Denies any chest pain, shortness of breath, cough, or hemoptysis. Denies any nausea, vomiting, diarrhea, or constipation. Denies any headache or visual changes. Denies any rashes or skin changes. He denies any peripheral neuropathy.He denies any recent signs and symptoms of infection. He denies any abnormal bleeding or bruising including melena, hematochezia, hematuria, epistaxis, or gingival bleeding.  The patient is here today for evaluation prior to starting cycle # day 15 cycle 4   MEDICAL HISTORY:No past medical history on file.  ALLERGIES:  has No Known Allergies.  MEDICATIONS:  Current Outpatient Medications  Medication Sig Dispense Refill  . allopurinol (ZYLOPRIM) 100 MG tablet Take 1 tablet (100 mg total) by mouth 2 (two) times daily. 60 tablet 2  . lidocaine-prilocaine (EMLA) cream Apply 1 application topically as needed. 30 g 1  . prochlorperazine (COMPAZINE) 10 MG tablet Take 1 tablet (10  mg total) by mouth every 6 (six) hours as needed for nausea or vomiting. 30 tablet 0   No current facility-administered medications for this visit.    SURGICAL HISTORY:  Past Surgical History:  Procedure Laterality Date  . IR IMAGING GUIDED PORT INSERTION  06/30/2019  . IR US GUIDE BX ASP/DRAIN  05/27/2019    REVIEW OF SYSTEMS:   Review of Systems  Constitutional: Negative for appetite change, chills, fatigue, fever and unexpected weight change.  HENT:   Negative for mouth sores, nosebleeds, sore throat and trouble swallowing.   Eyes: Negative for eye problems and icterus.  Respiratory: Negative for cough, hemoptysis, shortness of breath and wheezing.   Cardiovascular: Negative for chest pain and leg swelling.  Gastrointestinal: Negative for abdominal pain, constipation, diarrhea, nausea and vomiting.  Genitourinary: Negative for bladder incontinence, difficulty urinating, dysuria, frequency and hematuria.   Musculoskeletal: Negative for back pain, gait problem, neck pain and neck stiffness.  Skin: Negative for itching and rash.  Neurological: Negative for dizziness, extremity weakness, gait problem, headaches, light-headedness and seizures.  Hematological: Negative for adenopathy. Does not bruise/bleed easily.  Psychiatric/Behavioral: Negative for confusion, depression and sleep disturbance. The patient is not nervous/anxious.     PHYSICAL EXAMINATION:  There were no vitals taken for this visit.  ECOG PERFORMANCE STATUS: {CHL ONC ECOG [id number]  Physical Exam  Constitutional: Oriented to person, place, and time and well-developed, well-nourished, and in no distress. No distress.  HENT:  Head: Normocephalic and atraumatic.  Mouth/Throat: Oropharynx is clear and moist. No oropharyngeal exudate.  Eyes: Conjunctivae are normal. Right eye exhibits no discharge. Left eye exhibits no discharge. No scleral icterus.  Neck: Normal range of motion. Neck supple.  Cardiovascular:  Normal rate, regular rhythm, normal heart sounds and  intact distal pulses.   Pulmonary/Chest: Effort normal and breath sounds normal. No respiratory distress. No wheezes. No rales.  Abdominal: Soft. Bowel sounds are normal. Exhibits no distension and no mass. There is no tenderness.  Musculoskeletal: Normal range of motion. Exhibits no edema.  Lymphadenopathy:    No cervical adenopathy.  Neurological: Alert and oriented to person, place, and time. Exhibits normal muscle tone. Gait normal. Coordination normal.  Skin: Skin is warm and dry. No rash noted. Not diaphoretic. No erythema. No pallor.  Psychiatric: Mood, memory and judgment normal.  Vitals reviewed.  LABORATORY DATA: Lab Results  Component Value Date   WBC 5.9 09/01/2019   HGB 12.9 (L) 09/01/2019   HCT 38.3 (L) 09/01/2019   MCV 84.4 09/01/2019   PLT 215 09/01/2019      Chemistry      Component Value Date/Time   NA 138 09/01/2019 1004   K 4.5 09/01/2019 1004   CL 103 09/01/2019 1004   CO2 25 09/01/2019 1004   BUN 22 (H) 09/01/2019 1004   CREATININE 0.76 09/01/2019 1004      Component Value Date/Time   CALCIUM 9.3 09/01/2019 1004   ALKPHOS 61 09/01/2019 1004   AST 14 (L) 09/01/2019 1004   ALT 18 09/01/2019 1004   BILITOT <0.2 (L) 09/01/2019 1004       RADIOGRAPHIC STUDIES:  No results found.   ASSESSMENT/PLAN:  This is a very pleasant 19 year old male diagnosed with classical Hodgkin's lymphoma, nodular sclerosing subtype. He presented with bulky bilateral cervical lymphadenopathy, mediastinal lymphadenopathy, and mildly enlarged retroperitoneal,periaortic, and iliac adenopathy. He also presents with spleen involvement. He was diagnosed in December 2020  The patient is currently undergoing systemic chemotherapy with ABVD(doxorubicin, dacarbazine, vinblastine, and bleomycin)on days 1 and 15 every 4 weeks. He is status post day 1 cycle 4. Starting from cycle #4, bleomycin will be dropped. He is tolerating  treatment well except formild nausea with cycle #1 which is controlled with his anti-emetic.   The patient was seen with Dr. Julien Nordmann today. Labs were reviewed. Recommend that he proceed with cycle #4 day 15 today as scheduled.   We will see him back for a follow up visit in 2 weeks for evaluation before starting cycle #5.   The patient was advised to call immediately if he has any concerning symptoms in the interval. The patient voices understanding of current disease status and treatment options and is in agreement with the current care plan. All questions were answered. The patient knows to call the clinic with any problems, questions or concerns. We can certainly see the patient much sooner if necessary     No orders of the defined types were placed in this encounter.    Argil Mahl L Shantaya Bluestone, PA-C 09/12/19

## 2019-09-15 ENCOUNTER — Inpatient Hospital Stay: Payer: Medicaid Other

## 2019-09-15 ENCOUNTER — Telehealth: Payer: Self-pay | Admitting: Physician Assistant

## 2019-09-15 ENCOUNTER — Telehealth: Payer: Self-pay | Admitting: Internal Medicine

## 2019-09-15 ENCOUNTER — Inpatient Hospital Stay: Payer: Medicaid Other | Admitting: Physician Assistant

## 2019-09-15 NOTE — Telephone Encounter (Signed)
Called the patient and confirmed his appointment with him for tomorrow 4/14. He expressed understanding and knows to arrive a few minutes early

## 2019-09-15 NOTE — Telephone Encounter (Signed)
Attempted to call due to the two no show appointments this morning,

## 2019-09-16 ENCOUNTER — Inpatient Hospital Stay: Payer: Medicaid Other | Admitting: Physician Assistant

## 2019-09-16 ENCOUNTER — Inpatient Hospital Stay: Payer: Medicaid Other

## 2019-09-16 ENCOUNTER — Telehealth: Payer: Self-pay | Admitting: Physician Assistant

## 2019-09-16 NOTE — Progress Notes (Deleted)
[place] OFFICE PROGRESS NOTE  Patient, No Pcp Per No address on file  DIAGNOSIS: Classical Hodgkin Lymphoma, Nodular Sclerosis subtype. He presented with bulky bilateral cervical lymphadenopathy, mediastinal lymphadenopathy, and mildly enlarged retroperitoneal periaortic adenopathy and iliac adenopathy. He also presented with involvement of the spleen. He was diagnosed in December 2020.  PRIOR THERAPY: None  CURRENT THERAPY: Systemic chemotherapy with doxorubicin, dacarbazine, vinblastine, and bleomycin on days 1 and 15 every 4 weeks. First dose expected on January 7th, 2021.Bleomycin has been dropped starting from cycle #4.Statuspostday 1 cycle 4.  INTERVAL HISTORY: Terry Quinn 19 y.o. male returns to the clinic for a follow up visit. The patient is feeling well today without any concerning complaints. The patient continues to tolerate treatment with chemotherapy well without any adverse side effects. Denies any fever, chills, night sweats, or weight loss. Denies any chest pain, shortness of breath, cough, or hemoptysis. Denies any nausea, vomiting, diarrhea, or constipation. Denies any headache or visual changes. Denies any rashes or skin changes. He denies any peripheral neuropathy.He denies any recent signs and symptoms of infection. He denies any abnormal bleeding or bruising including melena, hematochezia, hematuria, epistaxis, or gingival bleeding.  The patient is here today for evaluation prior to starting cycle # day 15 cycle 4  MEDICAL HISTORY:No past medical history on file.  ALLERGIES:  has No Known Allergies.  MEDICATIONS:  Current Outpatient Medications  Medication Sig Dispense Refill  . allopurinol (ZYLOPRIM) 100 MG tablet Take 1 tablet (100 mg total) by mouth 2 (two) times daily. 60 tablet 2  . lidocaine-prilocaine (EMLA) cream Apply 1 application topically as needed. 30 g 1  . prochlorperazine (COMPAZINE) 10 MG tablet Take 1 tablet (10 mg  total) by mouth every 6 (six) hours as needed for nausea or vomiting. 30 tablet 0   No current facility-administered medications for this visit.    SURGICAL HISTORY:  Past Surgical History:  Procedure Laterality Date  . IR IMAGING GUIDED PORT INSERTION  06/30/2019  . IR US GUIDE BX ASP/DRAIN  05/27/2019    REVIEW OF SYSTEMS:   Review of Systems  Constitutional: Negative for appetite change, chills, fatigue, fever and unexpected weight change.  HENT:   Negative for mouth sores, nosebleeds, sore throat and trouble swallowing.   Eyes: Negative for eye problems and icterus.  Respiratory: Negative for cough, hemoptysis, shortness of breath and wheezing.   Cardiovascular: Negative for chest pain and leg swelling.  Gastrointestinal: Negative for abdominal pain, constipation, diarrhea, nausea and vomiting.  Genitourinary: Negative for bladder incontinence, difficulty urinating, dysuria, frequency and hematuria.   Musculoskeletal: Negative for back pain, gait problem, neck pain and neck stiffness.  Skin: Negative for itching and rash.  Neurological: Negative for dizziness, extremity weakness, gait problem, headaches, light-headedness and seizures.  Hematological: Negative for adenopathy. Does not bruise/bleed easily.  Psychiatric/Behavioral: Negative for confusion, depression and sleep disturbance. The patient is not nervous/anxious.     PHYSICAL EXAMINATION:  There were no vitals taken for this visit.  ECOG PERFORMANCE STATUS: {CHL ONC ECOG [id number]  Physical Exam  Constitutional: Oriented to person, place, and time and well-developed, well-nourished, and in no distress. No distress.  HENT:  Head: Normocephalic and atraumatic.  Mouth/Throat: Oropharynx is clear and moist. No oropharyngeal exudate.  Eyes: Conjunctivae are normal. Right eye exhibits no discharge. Left eye exhibits no discharge. No scleral icterus.  Neck: Normal range of motion. Neck supple.  Cardiovascular: Normal  rate, regular rhythm, normal heart sounds and intact distal pulses.  Pulmonary/Chest: Effort normal and breath sounds normal. No respiratory distress. No wheezes. No rales.  Abdominal: Soft. Bowel sounds are normal. Exhibits no distension and no mass. There is no tenderness.  Musculoskeletal: Normal range of motion. Exhibits no edema.  Lymphadenopathy:    No cervical adenopathy.  Neurological: Alert and oriented to person, place, and time. Exhibits normal muscle tone. Gait normal. Coordination normal.  Skin: Skin is warm and dry. No rash noted. Not diaphoretic. No erythema. No pallor.  Psychiatric: Mood, memory and judgment normal.  Vitals reviewed.  LABORATORY DATA: Lab Results  Component Value Date   WBC 5.9 09/01/2019   HGB 12.9 (L) 09/01/2019   HCT 38.3 (L) 09/01/2019   MCV 84.4 09/01/2019   PLT 215 09/01/2019      Chemistry      Component Value Date/Time   NA 138 09/01/2019 1004   K 4.5 09/01/2019 1004   CL 103 09/01/2019 1004   CO2 25 09/01/2019 1004   BUN 22 (H) 09/01/2019 1004   CREATININE 0.76 09/01/2019 1004      Component Value Date/Time   CALCIUM 9.3 09/01/2019 1004   ALKPHOS 61 09/01/2019 1004   AST 14 (L) 09/01/2019 1004   ALT 18 09/01/2019 1004   BILITOT <0.2 (L) 09/01/2019 1004       RADIOGRAPHIC STUDIES:  No results found.   ASSESSMENT/PLAN:  This is a very pleasant 19 year old male diagnosed with classical Hodgkin's lymphoma, nodular sclerosing subtype. He presented with bulky bilateral cervical lymphadenopathy, mediastinal lymphadenopathy, and mildly enlarged retroperitoneal,periaortic, and iliac adenopathy. He also presents with spleen involvement. He was diagnosed in December 2020  The patient is currently undergoing systemic chemotherapy with ABVD(doxorubicin, dacarbazine, vinblastine, and bleomycin)on days 1 and 15 every 4 weeks. He is status post day 1 cycle 4. He is tolerating treatment well except formild nausea with cycle #1 which  is controlled with his anti-emetic. Starting from cycle #4, bleomycin has been dropped.    Labs were reviewed. Recommend that he proceed with cycle #4 day 15 today as scheduled.   We will see him back for a follow up visit in 2 weeks for evaluation before starting cycle #5.   The patient was advised to call immediately if he has any concerning symptoms in the interval. The patient voices understanding of current disease status and treatment options and is in agreement with the current care plan. All questions were answered. The patient knows to call the clinic with any problems, questions or concerns. We can certainly see the patient much sooner if necessary  No orders of the defined types were placed in this encounter.    Nuno Brubacher L Sanjana Folz, PA-C 09/16/19

## 2019-09-16 NOTE — Telephone Encounter (Signed)
Patient did not show up for his appointment today. Rescheduled for Friday 4/16. Unable to reach patient. Left a voicemail with new appointment time. Advised to arrive around 9 AM to allow a few extra minutes to register. Asked the patient to call back just to let us know he received the voicemail to confirm the appointment time. Left our call back number.

## 2019-09-16 NOTE — Progress Notes (Deleted)
[facility] OFFICE PROGRESS NOTE  Patient, No Pcp Per No address on file  DIAGNOSIS: Classical Hodgkin Lymphoma, Nodular Sclerosis subtype. He presented with bulky bilateral cervical lymphadenopathy, mediastinal lymphadenopathy, and mildly enlarged retroperitoneal periaortic adenopathy and iliac adenopathy. He also presented with involvement of the spleen. He was diagnosed in December 2020.  PRIOR THERAPY: None  CURRENT THERAPY: Systemic chemotherapy with doxorubicin, dacarbazine, vinblastine, and bleomycin on days 1 and 15 every 4 weeks. First dose expected on January 7th, 2021.Statuspostday 1 cycle 4.Starting from cycle #4, Bleomycin has been dropped.   INTERVAL HISTORY: Terry Quinn 19 y.o. male returns to the clinic for a follow up visit. The patient is feeling well today without any concerning complaints. The patient continues to tolerate treatment with chemotherapy well without any adverse side effects. Denies any fever, chills, night sweats, or weight loss. Denies any chest pain, shortness of breath, cough, or hemoptysis. Denies any nausea, vomiting, diarrhea, or constipation. Denies any headache or visual changes. Denies any rashes or skin changes. He denies any peripheral neuropathy.He denies any recent signs and symptoms of infection. He denies any abnormal bleeding or bruising including melena, hematochezia, hematuria, epistaxis, or gingival bleeding.  The patient is here today for evaluation prior to starting cycle # day 15 cycle 4    MEDICAL HISTORY:No past medical history on file.  ALLERGIES:  has No Known Allergies.  MEDICATIONS:  Current Outpatient Medications  Medication Sig Dispense Refill  . allopurinol (ZYLOPRIM) 100 MG tablet Take 1 tablet (100 mg total) by mouth 2 (two) times daily. 60 tablet 2  . lidocaine-prilocaine (EMLA) cream Apply 1 application topically as needed. 30 g 1  . prochlorperazine (COMPAZINE) 10 MG tablet Take 1 tablet (10  mg total) by mouth every 6 (six) hours as needed for nausea or vomiting. 30 tablet 0   No current facility-administered medications for this visit.    SURGICAL HISTORY:  Past Surgical History:  Procedure Laterality Date  . IR IMAGING GUIDED PORT INSERTION  06/30/2019  . IR US GUIDE BX ASP/DRAIN  05/27/2019    REVIEW OF SYSTEMS:   Review of Systems  Constitutional: Negative for appetite change, chills, fatigue, fever and unexpected weight change.  HENT:   Negative for mouth sores, nosebleeds, sore throat and trouble swallowing.   Eyes: Negative for eye problems and icterus.  Respiratory: Negative for cough, hemoptysis, shortness of breath and wheezing.   Cardiovascular: Negative for chest pain and leg swelling.  Gastrointestinal: Negative for abdominal pain, constipation, diarrhea, nausea and vomiting.  Genitourinary: Negative for bladder incontinence, difficulty urinating, dysuria, frequency and hematuria.   Musculoskeletal: Negative for back pain, gait problem, neck pain and neck stiffness.  Skin: Negative for itching and rash.  Neurological: Negative for dizziness, extremity weakness, gait problem, headaches, light-headedness and seizures.  Hematological: Negative for adenopathy. Does not bruise/bleed easily.  Psychiatric/Behavioral: Negative for confusion, depression and sleep disturbance. The patient is not nervous/anxious.     PHYSICAL EXAMINATION:  There were no vitals taken for this visit.  ECOG PERFORMANCE STATUS: {CHL ONC ECOG [id number]  Physical Exam  Constitutional: Oriented to person, place, and time and well-developed, well-nourished, and in no distress. No distress.  HENT:  Head: Normocephalic and atraumatic.  Mouth/Throat: Oropharynx is clear and moist. No oropharyngeal exudate.  Eyes: Conjunctivae are normal. Right eye exhibits no discharge. Left eye exhibits no discharge. No scleral icterus.  Neck: Normal range of motion. Neck supple.  Cardiovascular:  Normal rate, regular rhythm, normal heart sounds and  intact distal pulses.   Pulmonary/Chest: Effort normal and breath sounds normal. No respiratory distress. No wheezes. No rales.  Abdominal: Soft. Bowel sounds are normal. Exhibits no distension and no mass. There is no tenderness.  Musculoskeletal: Normal range of motion. Exhibits no edema.  Lymphadenopathy:    No cervical adenopathy.  Neurological: Alert and oriented to person, place, and time. Exhibits normal muscle tone. Gait normal. Coordination normal.  Skin: Skin is warm and dry. No rash noted. Not diaphoretic. No erythema. No pallor.  Psychiatric: Mood, memory and judgment normal.  Vitals reviewed.  LABORATORY DATA: Lab Results  Component Value Date   WBC 5.9 09/01/2019   HGB 12.9 (L) 09/01/2019   HCT 38.3 (L) 09/01/2019   MCV 84.4 09/01/2019   PLT 215 09/01/2019      Chemistry      Component Value Date/Time   NA 138 09/01/2019 1004   K 4.5 09/01/2019 1004   CL 103 09/01/2019 1004   CO2 25 09/01/2019 1004   BUN 22 (H) 09/01/2019 1004   CREATININE 0.76 09/01/2019 1004      Component Value Date/Time   CALCIUM 9.3 09/01/2019 1004   ALKPHOS 61 09/01/2019 1004   AST 14 (L) 09/01/2019 1004   ALT 18 09/01/2019 1004   BILITOT <0.2 (L) 09/01/2019 1004       RADIOGRAPHIC STUDIES:  No results found.   ASSESSMENT/PLAN:  This is a very pleasant 19 year old male diagnosed with classical Hodgkin's lymphoma, nodular sclerosing subtype. He presented with bulky bilateral cervical lymphadenopathy, mediastinal lymphadenopathy, and mildly enlarged retroperitoneal,periaortic, and iliac adenopathy. He also presents with spleen involvement. He was diagnosed in December 2020  The patient is currently undergoing systemic chemotherapy with ABVD(doxorubicin, dacarbazine, vinblastine, and bleomycin)on days 1 and 15 every 4 weeks. He is status post day 1 cycle 4. He is tolerating treatment well except formild nausea with cycle #1  which is controlled with his anti-emetic. Starting from cycle #4, bleomycin has been dropped.    Labs were reviewed. Recommend that he proceed with cycle #4 day 15 today as scheduled.   We will see him back for a follow up visit in 2 weeks for evaluation before starting cycle #4.   The patient was advised to call immediately if he has any concerning symptoms in the interval. The patient voices understanding of current disease status and treatment options and is in agreement with the current care plan. All questions were answered. The patient knows to call the clinic with any problems, questions or concerns. We can certainly see the patient much sooner if necessary    No orders of the defined types were placed in this encounter.    Lestine Rahe L Maalle Starrett, PA-C 09/16/19

## 2019-09-17 ENCOUNTER — Telehealth: Payer: Self-pay | Admitting: Physician Assistant

## 2019-09-17 NOTE — Telephone Encounter (Signed)
Called pt per 4/14 sch message - no answer. Left message with appt date and time

## 2019-09-18 ENCOUNTER — Ambulatory Visit: Payer: Medicaid Other | Admitting: Physician Assistant

## 2019-09-18 ENCOUNTER — Telehealth: Payer: Self-pay

## 2019-09-18 ENCOUNTER — Other Ambulatory Visit: Payer: Medicaid Other

## 2019-09-18 ENCOUNTER — Ambulatory Visit: Payer: Medicaid Other

## 2019-09-18 ENCOUNTER — Telehealth: Payer: Self-pay | Admitting: Physician Assistant

## 2019-09-18 NOTE — Telephone Encounter (Signed)
The patient had given me permission when asked at a prior appointment to speak to his mentor, Marisue Humble [phone]). She informed me in the past that the patient does not have strong parental support. Tried to reach her to see if she had spoken to the patient lately since he had not showed up to multiple rescheduled appointments this week. Left message.

## 2019-09-18 NOTE — Telephone Encounter (Signed)
Called Terry Quinn and left him a message about his appointments today.  He has no showed appointments the past couple of times. Will attempt to call his mom as well as she is listed as a contact. Gardiner Rhyme, RN

## 2019-09-18 NOTE — Telephone Encounter (Signed)
Received a voicemail from patient at 11:00 stating he could still come in for today's appointments if space was available. Returned patient's call and got his voicemail. Left a message letting patient know that he could come in at 12 for lab, MD, infusion appointments. Patient called back around 1215 stating that he would not be able to make it today and would like to reschedule for tomorrow. Informed patient that chemo treatments are not done on Saturdays but he can be rescheduled for Monday 09/21/19. Patient verbalized understanding and agreed to a 9:00 appointment on Monday 09/21/19.

## 2019-09-18 NOTE — Progress Notes (Signed)
[facility] OFFICE PROGRESS NOTE  Patient, No Pcp Per No address on file  DIAGNOSIS: Classical Hodgkin Lymphoma, Nodular Sclerosis subtype. He presented with bulky bilateral cervical lymphadenopathy, mediastinal lymphadenopathy, and mildly enlarged retroperitoneal periaortic adenopathy and iliac adenopathy. He also presented with involvement of the spleen. He was diagnosed in December 2020.  PRIOR THERAPY: None   CURRENT THERAPY: Systemic chemotherapy with doxorubicin, dacarbazine, vinblastine, and bleomycin on days 1 and 15 every 4 weeks. First dose expected on January 7th, 2021.Statuspostday 1 cycle 4.Starting from cycle #4, bleomycin will be dropped.   INTERVAL HISTORY: Terry Quinn 19 y.o. male returns to the clinic for a follow up appointment. The patient is feeling well today without any concerning complaints except itchy palms in the AM which is worse with heat. Denies any skin lesions, rash, or erythema. This has been occurring for several weeks. Otherwise, the patient continues to tolerate treatment with chemotherapy well without any adverse side effects. Denies any fever, chills, night sweats, or weight loss. Denies any chest pain, shortness of breath, cough, or hemoptysis. Denies any nausea, vomiting, diarrhea, or constipation. Denies any headache or visual changes. He denies any peripheral neuropathy.He denies any recent signs and symptoms of infection. He denies any abnormal bleeding or bruising including melena, hematochezia, hematuria, epistaxis, or gingival bleeding.  The patient is here today for evaluation prior to starting cycle # day 15 cycle 4  MEDICAL HISTORY:No past medical history on file.  ALLERGIES:  has No Known Allergies.  MEDICATIONS:  Current Outpatient Medications  Medication Sig Dispense Refill  . allopurinol (ZYLOPRIM) 100 MG tablet Take 1 tablet (100 mg total) by mouth 2 (two) times daily. 60 tablet 2  . lidocaine-prilocaine  (EMLA) cream Apply 1 application topically as needed. (Patient not taking: Reported on 09/21/2019) 30 g 1  . prochlorperazine (COMPAZINE) 10 MG tablet Take 1 tablet (10 mg total) by mouth every 6 (six) hours as needed for nausea or vomiting. (Patient not taking: Reported on 09/21/2019) 30 tablet 0   No current facility-administered medications for this visit.   Facility-Administered Medications Ordered in Other Visits  Medication Dose Route Frequency Provider Last Rate Last Admin  . 0.9 %  sodium chloride infusion   Intravenous Once Curt Bears, MD      . dacarbazine (DTIC) 830 mg in sodium chloride 0.9 % 250 mL chemo infusion  375 mg/m2 (Treatment Plan Recorded) Intravenous Once Curt Bears, MD      . dexamethasone (DECADRON) 10 mg in sodium chloride 0.9 % 50 mL IVPB  10 mg Intravenous Once Curt Bears, MD      . DOXOrubicin (ADRIAMYCIN) chemo injection 56 mg  25 mg/m2 (Treatment Plan Recorded) Intravenous Once Curt Bears, MD      . fosaprepitant (EMEND) 150 mg in sodium chloride 0.9 % 145 mL IVPB  150 mg Intravenous Once Curt Bears, MD      . heparin lock flush 100 unit/mL  500 Units Intracatheter Once PRN Curt Bears, MD      . palonosetron (ALOXI) injection 0.25 mg  0.25 mg Intravenous Once Curt Bears, MD      . sodium chloride flush (NS) 0.9 % injection 10 mL  10 mL Intracatheter PRN Curt Bears, MD      . vinBLAStine (VELBAN) 13.3 mg in sodium chloride 0.9 % 50 mL chemo infusion  6 mg/m2 (Treatment Plan Recorded) Intravenous Once Curt Bears, MD        SURGICAL HISTORY:  Past Surgical History:  Procedure  Laterality Date  . IR IMAGING GUIDED PORT INSERTION  06/30/2019  . IR US GUIDE BX ASP/DRAIN  05/27/2019    REVIEW OF SYSTEMS:   Review of Systems  Constitutional: Negative for appetite change, chills, fatigue, fever and unexpected weight change.  HENT:   Negative for mouth sores, nosebleeds, sore throat and trouble swallowing.   Eyes:  Negative for eye problems and icterus.  Respiratory: Negative for cough, hemoptysis, shortness of breath and wheezing.  Cardiovascular: Negative for chest pain and leg swelling.  Gastrointestinal: Negative for abdominal pain, constipation, diarrhea, nausea and vomiting.  Genitourinary: Negative for bladder incontinence, difficulty urinating, dysuria, frequency and hematuria.   Musculoskeletal: Negative for back pain, gait problem, neck pain and neck stiffness.  Skin: Positive for itchy palms. Negative for rash, burrows, or erythema.  Neurological: Negative for dizziness, extremity weakness, gait problem, headaches, light-headedness and seizures.  Hematological: Negative for adenopathy. Does not bruise/bleed easily.  Psychiatric/Behavioral: Negative for confusion, depression and sleep disturbance. The patient is not nervous/anxious.     PHYSICAL EXAMINATION:  Blood pressure 125/63, pulse 60, temperature 98.7 F (37.1 C), temperature source Temporal, resp. rate 20, height 6' (1.829 m), weight 258 lb (117 kg), SpO2 100 %.  ECOG PERFORMANCE STATUS: 1 - Symptomatic but completely ambulatory  Physical Exam  Constitutional: Oriented to person, place, and time and well-developed, well-nourished, and in no distress.  HENT:  Head: Normocephalic and atraumatic.  Mouth/Throat: Oropharynx is clear and moist. No oropharyngeal exudate.  Eyes: Conjunctivae are normal. Right eye exhibits no discharge. Left eye exhibits no discharge. No scleral icterus.  Neck: Normal range of motion. Neck supple.  Cardiovascular: Normal rate, regular rhythm, normal heart sounds and intact distal pulses.   Pulmonary/Chest: Effort normal and breath sounds normal. No respiratory distress. No wheezes. No rales.  Abdominal: Soft. Bowel sounds are normal. Exhibits no distension and no mass. There is no tenderness.  Musculoskeletal: Normal range of motion. Exhibits no edema.  Lymphadenopathy:    No cervical adenopathy.   Neurological: Alert and oriented to person, place, and time. Exhibits normal muscle tone. Gait normal. Coordination normal.  Skin: Skin is warm and dry. No rash noted. Not diaphoretic. No erythema. No pallor.  Psychiatric: Mood, memory and judgment normal.  Vitals reviewed.  LABORATORY DATA: Lab Results  Component Value Date   WBC 6.7 09/21/2019   HGB 12.8 (L) 09/21/2019   HCT 38.0 (L) 09/21/2019   MCV 84.8 09/21/2019   PLT 227 09/21/2019      Chemistry      Component Value Date/Time   NA 139 09/21/2019 0926   K 4.2 09/21/2019 0926   CL 103 09/21/2019 0926   CO2 27 09/21/2019 0926   BUN 16 09/21/2019 0926   CREATININE 0.77 09/21/2019 0926      Component Value Date/Time   CALCIUM 8.7 (L) 09/21/2019 0926   ALKPHOS 58 09/21/2019 0926   AST 29 09/21/2019 0926   ALT 35 09/21/2019 0926   BILITOT <0.2 (L) 09/21/2019 0926       RADIOGRAPHIC STUDIES:  No results found.   ASSESSMENT/PLAN:  This is a very pleasant 19 year old male diagnosed with classical Hodgkin's lymphoma, nodular sclerosing subtype. He presented with bulky bilateral cervical lymphadenopathy, mediastinal lymphadenopathy, and mildly enlarged retroperitoneal,periaortic, and iliac adenopathy. He also presents with spleen involvement. He was diagnosed in December 2020.  The patient is currently undergoing systemic chemotherapy with ABVD(doxorubicin, dacarbazine, vinblastine, and bleomycin)on days 1 and 15 every 4 weeks. He is status post day 1 cycle  4. He is tolerating treatment well except formild nausea with cycle #1 which is controlled with his anti-emetic. Starting from cycle #4, bleomycin will be dropped.   Labs were reviewed. Recommend that he proceed with cycle #4 day 15 today as scheduled.   We will see him back for a follow up visit in 2 weeks for evaluation before starting cycle #5.   He will moisturize his hands and will use hydrocortisone for his hands. No abnormalities seen on exam.    The patient was advised to call immediately if he has any concerning symptoms in the interval. The patient voices understanding of current disease status and treatment options and is in agreement with the current care plan. All questions were answered. The patient knows to call the clinic with any problems, questions or concerns. We can certainly see the patient much sooner if necessary    No orders of the defined types were placed in this encounter.    Ayvin Lipinski L Dilia Alemany, PA-C 09/21/19

## 2019-09-21 ENCOUNTER — Inpatient Hospital Stay: Payer: Medicaid Other

## 2019-09-21 ENCOUNTER — Inpatient Hospital Stay: Payer: Medicaid Other | Attending: Physician Assistant | Admitting: Physician Assistant

## 2019-09-21 ENCOUNTER — Other Ambulatory Visit: Payer: Self-pay

## 2019-09-21 VITALS — BP 125/63 | HR 60 | Temp 98.7°F | Resp 20 | Ht 72.0 in | Wt 258.0 lb

## 2019-09-21 DIAGNOSIS — C8111 Nodular sclerosis classical Hodgkin lymphoma, lymph nodes of head, face, and neck: Secondary | ICD-10-CM

## 2019-09-21 DIAGNOSIS — C8118 Nodular sclerosis classical Hodgkin lymphoma, lymph nodes of multiple sites: Secondary | ICD-10-CM | POA: Diagnosis present

## 2019-09-21 DIAGNOSIS — Z79899 Other long term (current) drug therapy: Secondary | ICD-10-CM | POA: Insufficient documentation

## 2019-09-21 DIAGNOSIS — Z7952 Long term (current) use of systemic steroids: Secondary | ICD-10-CM | POA: Insufficient documentation

## 2019-09-21 DIAGNOSIS — Z5111 Encounter for antineoplastic chemotherapy: Secondary | ICD-10-CM

## 2019-09-21 DIAGNOSIS — Z95828 Presence of other vascular implants and grafts: Secondary | ICD-10-CM

## 2019-09-21 LAB — CMP (CANCER CENTER ONLY)
ALT: 35 U/L (ref 0–44)
AST: 29 U/L (ref 15–41)
Albumin: 3.9 g/dL (ref 3.5–5.0)
Alkaline Phosphatase: 58 U/L (ref 38–126)
Anion gap: 9 (ref 5–15)
BUN: 16 mg/dL (ref 6–20)
CO2: 27 mmol/L (ref 22–32)
Calcium: 8.7 mg/dL — ABNORMAL LOW (ref 8.9–10.3)
Chloride: 103 mmol/L (ref 98–111)
Creatinine: 0.77 mg/dL (ref 0.61–1.24)
GFR, Est AFR Am: >60 mL/min (ref >60)
GFR, Estimated: >60 mL/min (ref >60)
Glucose, Bld: 87 mg/dL (ref 70–99)
Potassium: 4.2 mmol/L (ref 3.5–5.1)
Sodium: 139 mmol/L (ref 135–145)
Total Bilirubin: <0.2 mg/dL — ABNORMAL LOW (ref 0.3–1.2)
Total Protein: 6.8 g/dL (ref 6.5–8.1)

## 2019-09-21 LAB — CBC WITH DIFFERENTIAL (CANCER CENTER ONLY)
Abs Immature Granulocytes: 0.08 10*3/uL — ABNORMAL HIGH (ref 0.00–0.07)
Basophils Absolute: 0 10*3/uL (ref 0.0–0.1)
Basophils Relative: 1 %
Eosinophils Absolute: 0.2 10*3/uL (ref 0.0–0.5)
Eosinophils Relative: 3 %
HCT: 38 % — ABNORMAL LOW (ref 39.0–52.0)
Hemoglobin: 12.8 g/dL — ABNORMAL LOW (ref 13.0–17.0)
Immature Granulocytes: 1 %
Lymphocytes Relative: 34 %
Lymphs Abs: 2.3 10*3/uL (ref 0.7–4.0)
MCH: 28.6 pg (ref 26.0–34.0)
MCHC: 33.7 g/dL (ref 30.0–36.0)
MCV: 84.8 fL (ref 80.0–100.0)
Monocytes Absolute: 0.6 10*3/uL (ref 0.1–1.0)
Monocytes Relative: 10 %
Neutro Abs: 3.4 10*3/uL (ref 1.7–7.7)
Neutrophils Relative %: 51 %
Platelet Count: 227 10*3/uL (ref 150–400)
RBC: 4.48 MIL/uL (ref 4.22–5.81)
RDW: 18 % — ABNORMAL HIGH (ref 11.5–15.5)
WBC Count: 6.7 10*3/uL (ref 4.0–10.5)
nRBC: 0 % (ref 0.0–0.2)

## 2019-09-21 MED ORDER — VINBLASTINE SULFATE CHEMO INJECTION 1 MG/ML
6.0000 mg/m2 | Freq: Once | INTRAVENOUS | Status: AC
  Administered 2019-09-21: 13.3 mg via INTRAVENOUS
  Filled 2019-09-21: qty 13.3

## 2019-09-21 MED ORDER — PALONOSETRON HCL INJECTION 0.25 MG/5ML
0.2500 mg | Freq: Once | INTRAVENOUS | Status: AC
  Administered 2019-09-21: 11:00:00 0.25 mg via INTRAVENOUS

## 2019-09-21 MED ORDER — HEPARIN SOD (PORK) LOCK FLUSH 100 UNIT/ML IV SOLN
500.0000 [IU] | Freq: Once | INTRAVENOUS | Status: AC | PRN
  Administered 2019-09-21: 500 [IU]
  Filled 2019-09-21: qty 5

## 2019-09-21 MED ORDER — SODIUM CHLORIDE 0.9 % IV SOLN
375.0000 mg/m2 | Freq: Once | INTRAVENOUS | Status: AC
  Administered 2019-09-21: 13:00:00 830 mg via INTRAVENOUS
  Filled 2019-09-21: qty 83

## 2019-09-21 MED ORDER — DOXORUBICIN HCL CHEMO IV INJECTION 2 MG/ML
25.0000 mg/m2 | Freq: Once | INTRAVENOUS | Status: AC
  Administered 2019-09-21: 13:00:00 56 mg via INTRAVENOUS
  Filled 2019-09-21: qty 28

## 2019-09-21 MED ORDER — SODIUM CHLORIDE 0.9 % IV SOLN
150.0000 mg | Freq: Once | INTRAVENOUS | Status: AC
  Administered 2019-09-21: 150 mg via INTRAVENOUS
  Filled 2019-09-21 (×2): qty 5
  Filled 2019-09-21: qty 150

## 2019-09-21 MED ORDER — SODIUM CHLORIDE 0.9 % IV SOLN
10.0000 mg | Freq: Once | INTRAVENOUS | Status: AC
  Administered 2019-09-21: 11:00:00 10 mg via INTRAVENOUS
  Filled 2019-09-21: qty 10
  Filled 2019-09-21 (×2): qty 1

## 2019-09-21 MED ORDER — SODIUM CHLORIDE 0.9 % IV SOLN
Freq: Once | INTRAVENOUS | Status: AC
  Filled 2019-09-21: qty 250

## 2019-09-21 MED ORDER — PALONOSETRON HCL INJECTION 0.25 MG/5ML
INTRAVENOUS | Status: AC
  Filled 2019-09-21: qty 5

## 2019-09-21 MED ORDER — SODIUM CHLORIDE 0.9% FLUSH
10.0000 mL | INTRAVENOUS | Status: DC | PRN
  Administered 2019-09-21: 10 mL via INTRAVENOUS
  Filled 2019-09-21: qty 10

## 2019-09-21 MED ORDER — SODIUM CHLORIDE 0.9% FLUSH
10.0000 mL | INTRAVENOUS | Status: DC | PRN
  Administered 2019-09-21: 14:00:00 10 mL
  Filled 2019-09-21: qty 10

## 2019-09-21 NOTE — Patient Instructions (Signed)

## 2019-09-21 NOTE — Patient Instructions (Signed)
[facility] Discharge Instructions for Patients Receiving Chemotherapy  Today you received the following chemotherapy agents:  Adriamycin, Vinblastine, Dacarbazine  To help prevent nausea and vomiting after your treatment, we encourage you to take your nausea medication as prescribed.   If you develop nausea and vomiting that is not controlled by your nausea medication, call the clinic.   BELOW ARE SYMPTOMS THAT SHOULD BE REPORTED IMMEDIATELY:  *FEVER GREATER THAN 100.5 F  *CHILLS WITH OR WITHOUT FEVER  NAUSEA AND VOMITING THAT IS NOT CONTROLLED WITH YOUR NAUSEA MEDICATION  *UNUSUAL SHORTNESS OF BREATH  *UNUSUAL BRUISING OR BLEEDING  TENDERNESS IN MOUTH AND THROAT WITH OR WITHOUT PRESENCE OF ULCERS  *URINARY PROBLEMS  *BOWEL PROBLEMS  UNUSUAL RASH Items with * indicate a potential emergency and should be followed up as soon as possible.  Feel free to call the clinic should you have any questions or concerns. The clinic phone number is [phone].  Please show the [facility] at check-in to the Emergency Department and triage nurse.

## 2019-09-22 ENCOUNTER — Telehealth: Payer: Self-pay | Admitting: Medical Oncology

## 2019-09-22 NOTE — Telephone Encounter (Signed)
Jana Half notified that pt received tx yesterday. appts are off by a week so I sent a schedule message for next cycle to start to on  may 4 or 5th and appts for may 17th or 18th.

## 2019-09-23 ENCOUNTER — Telehealth: Payer: Self-pay | Admitting: Internal Medicine

## 2019-09-23 NOTE — Telephone Encounter (Signed)
Scheduled per los. Called and spoke with patient. Confirmed appt 

## 2019-09-29 ENCOUNTER — Other Ambulatory Visit: Payer: Medicaid Other

## 2019-09-29 ENCOUNTER — Ambulatory Visit: Payer: Medicaid Other | Admitting: Internal Medicine

## 2019-09-29 ENCOUNTER — Ambulatory Visit: Payer: Medicaid Other

## 2019-10-02 NOTE — Progress Notes (Deleted)
[place] OFFICE PROGRESS NOTE  Patient, No Pcp Per No address on file  DIAGNOSIS: Classical Hodgkin Lymphoma, Nodular Sclerosis subtype. He presented with bulky bilateral cervical lymphadenopathy, mediastinal lymphadenopathy, and mildly enlarged retroperitoneal periaortic adenopathy and iliac adenopathy. He also presented with involvement of the spleen. He was diagnosed in December 2020.  PRIOR THERAPY: None  CURRENT THERAPY: Systemic chemotherapy with doxorubicin, dacarbazine, vinblastine, and bleomycin on days 1 and 15 every 4 weeks. First dose expected on January 7th, 2021.Statuspostday 15 cycle4.Starting from cycle #4, bleomycin will be dropped.   INTERVAL HISTORY: Terry Quinn 19 y.o. male returns to the clinic for a follow up appointment. The patient is feeling well today without any concerning complaints except itchy palms in the AM which is worse with heat. Denies any skin lesions, rash, or erythema. This has been occurring for several weeks. Otherwise, the patient continues to tolerate treatment with chemotherapywell without any adverse sideeffects. Denies any fever, chills, night sweats, or weight loss. Denies any chest pain, shortness of breath, cough, or hemoptysis. Denies any nausea, vomiting, diarrhea, or constipation. Denies any headache or visual changes. He denies any peripheral neuropathy.He denies any recent signs and symptoms of infection. He denies any abnormal bleeding or bruising including melena, hematochezia, hematuria, epistaxis, or gingival bleeding.The patient is here today for evaluation prior to starting cycle # day 1 cycle 5   MEDICAL HISTORY:No past medical history on file.  ALLERGIES:  has No Known Allergies.  MEDICATIONS:  Current Outpatient Medications  Medication Sig Dispense Refill  . allopurinol (ZYLOPRIM) 100 MG tablet Take 1 tablet (100 mg total) by mouth 2 (two) times daily. 60 tablet 2  . lidocaine-prilocaine  (EMLA) cream Apply 1 application topically as needed. (Patient not taking: Reported on 09/21/2019) 30 g 1  . prochlorperazine (COMPAZINE) 10 MG tablet Take 1 tablet (10 mg total) by mouth every 6 (six) hours as needed for nausea or vomiting. (Patient not taking: Reported on 09/21/2019) 30 tablet 0   No current facility-administered medications for this visit.    SURGICAL HISTORY:  Past Surgical History:  Procedure Laterality Date  . IR IMAGING GUIDED PORT INSERTION  06/30/2019  . IR US GUIDE BX ASP/DRAIN  05/27/2019    REVIEW OF SYSTEMS:   Review of Systems  Constitutional: Negative for appetite change, chills, fatigue, fever and unexpected weight change.  HENT:   Negative for mouth sores, nosebleeds, sore throat and trouble swallowing.   Eyes: Negative for eye problems and icterus.  Respiratory: Negative for cough, hemoptysis, shortness of breath and wheezing.   Cardiovascular: Negative for chest pain and leg swelling.  Gastrointestinal: Negative for abdominal pain, constipation, diarrhea, nausea and vomiting.  Genitourinary: Negative for bladder incontinence, difficulty urinating, dysuria, frequency and hematuria.   Musculoskeletal: Negative for back pain, gait problem, neck pain and neck stiffness.  Skin: Negative for itching and rash.  Neurological: Negative for dizziness, extremity weakness, gait problem, headaches, light-headedness and seizures.  Hematological: Negative for adenopathy. Does not bruise/bleed easily.  Psychiatric/Behavioral: Negative for confusion, depression and sleep disturbance. The patient is not nervous/anxious.     PHYSICAL EXAMINATION:  There were no vitals taken for this visit.  ECOG PERFORMANCE STATUS: {CHL ONC ECOG [id number]  Physical Exam  Constitutional: Oriented to person, place, and time and well-developed, well-nourished, and in no distress. No distress.  HENT:  Head: Normocephalic and atraumatic.  Mouth/Throat: Oropharynx is clear and  moist. No oropharyngeal exudate.  Eyes: Conjunctivae are normal. Right eye exhibits no discharge.  Left eye exhibits no discharge. No scleral icterus.  Neck: Normal range of motion. Neck supple.  Cardiovascular: Normal rate, regular rhythm, normal heart sounds and intact distal pulses.   Pulmonary/Chest: Effort normal and breath sounds normal. No respiratory distress. No wheezes. No rales.  Abdominal: Soft. Bowel sounds are normal. Exhibits no distension and no mass. There is no tenderness.  Musculoskeletal: Normal range of motion. Exhibits no edema.  Lymphadenopathy:    No cervical adenopathy.  Neurological: Alert and oriented to person, place, and time. Exhibits normal muscle tone. Gait normal. Coordination normal.  Skin: Skin is warm and dry. No rash noted. Not diaphoretic. No erythema. No pallor.  Psychiatric: Mood, memory and judgment normal.  Vitals reviewed.  LABORATORY DATA: Lab Results  Component Value Date   WBC 6.7 09/21/2019   HGB 12.8 (L) 09/21/2019   HCT 38.0 (L) 09/21/2019   MCV 84.8 09/21/2019   PLT 227 09/21/2019      Chemistry      Component Value Date/Time   NA 139 09/21/2019 0926   K 4.2 09/21/2019 0926   CL 103 09/21/2019 0926   CO2 27 09/21/2019 0926   BUN 16 09/21/2019 0926   CREATININE 0.77 09/21/2019 0926      Component Value Date/Time   CALCIUM 8.7 (L) 09/21/2019 0926   ALKPHOS 58 09/21/2019 0926   AST 29 09/21/2019 0926   ALT 35 09/21/2019 0926   BILITOT <0.2 (L) 09/21/2019 0926       RADIOGRAPHIC STUDIES:  No results found.   ASSESSMENT/PLAN:  This is a very pleasant 19 year old male diagnosed with classical Hodgkin's lymphoma, nodular sclerosing subtype. He presented with bulky bilateral cervical lymphadenopathy, mediastinal lymphadenopathy, and mildly enlarged retroperitoneal,periaortic, and iliac adenopathy. He also presents with spleen involvement. He was diagnosed in December 2020.  The patient is currently undergoing systemic  chemotherapy with ABVD(doxorubicin, dacarbazine, vinblastine, and bleomycin)on days 1 and 15 every 4 weeks. He is status post day 15 cycle4. He is tolerating treatment well except formild nausea with cycle #1 which is controlled with his anti-emetic. Starting from cycle #4, bleomycin will be dropped.   Labs were reviewed. Recommend that he proceed with cycle #5 today as scheduled.   We will see him back for a follow up visit in 2 weeks for evaluation before starting day 15 of cycle #5.   The patient was advised to call immediately if he has any concerning symptoms in the interval. The patient voices understanding of current disease status and treatment options and is in agreement with the current care plan. All questions were answered. The patient knows to call the clinic with any problems, questions or concerns. We can certainly see the patient much sooner if necessary   No orders of the defined types were placed in this encounter.    Taneeka Curtner L Rick Warnick, PA-C 10/02/19

## 2019-10-05 ENCOUNTER — Inpatient Hospital Stay: Payer: Medicaid Other

## 2019-10-05 ENCOUNTER — Inpatient Hospital Stay: Payer: Medicaid Other | Admitting: Physician Assistant

## 2019-10-05 NOTE — Telephone Encounter (Signed)
Pt confirmed appts for tomorrow. 

## 2019-10-06 ENCOUNTER — Inpatient Hospital Stay (HOSPITAL_BASED_OUTPATIENT_CLINIC_OR_DEPARTMENT_OTHER): Payer: Medicaid Other | Admitting: Physician Assistant

## 2019-10-06 ENCOUNTER — Inpatient Hospital Stay: Payer: Medicaid Other | Attending: Physician Assistant

## 2019-10-06 ENCOUNTER — Ambulatory Visit: Payer: Medicaid Other | Admitting: Internal Medicine

## 2019-10-06 ENCOUNTER — Encounter: Payer: Self-pay | Admitting: Physician Assistant

## 2019-10-06 ENCOUNTER — Other Ambulatory Visit: Payer: Self-pay

## 2019-10-06 ENCOUNTER — Inpatient Hospital Stay: Payer: Medicaid Other

## 2019-10-06 VITALS — BP 118/66 | HR 73 | Temp 98.5°F | Resp 18 | Ht 72.0 in | Wt 260.2 lb

## 2019-10-06 DIAGNOSIS — C8111 Nodular sclerosis classical Hodgkin lymphoma, lymph nodes of head, face, and neck: Secondary | ICD-10-CM

## 2019-10-06 DIAGNOSIS — Z5111 Encounter for antineoplastic chemotherapy: Secondary | ICD-10-CM

## 2019-10-06 DIAGNOSIS — R0981 Nasal congestion: Secondary | ICD-10-CM | POA: Insufficient documentation

## 2019-10-06 DIAGNOSIS — R0989 Other specified symptoms and signs involving the circulatory and respiratory systems: Secondary | ICD-10-CM | POA: Insufficient documentation

## 2019-10-06 DIAGNOSIS — R110 Nausea: Secondary | ICD-10-CM | POA: Diagnosis not present

## 2019-10-06 DIAGNOSIS — C8118 Nodular sclerosis classical Hodgkin lymphoma, lymph nodes of multiple sites: Secondary | ICD-10-CM | POA: Insufficient documentation

## 2019-10-06 LAB — CMP (CANCER CENTER ONLY)
ALT: 30 U/L (ref 0–44)
AST: 18 U/L (ref 15–41)
Albumin: 4.1 g/dL (ref 3.5–5.0)
Alkaline Phosphatase: 56 U/L (ref 38–126)
Anion gap: 7 (ref 5–15)
BUN: 11 mg/dL (ref 6–20)
CO2: 28 mmol/L (ref 22–32)
Calcium: 8.9 mg/dL (ref 8.9–10.3)
Chloride: 105 mmol/L (ref 98–111)
Creatinine: 0.77 mg/dL (ref 0.61–1.24)
GFR, Est AFR Am: >60 mL/min (ref >60)
GFR, Estimated: >60 mL/min (ref >60)
Glucose, Bld: 89 mg/dL (ref 70–99)
Potassium: 4.1 mmol/L (ref 3.5–5.1)
Sodium: 140 mmol/L (ref 135–145)
Total Bilirubin: 0.3 mg/dL (ref 0.3–1.2)
Total Protein: 6.8 g/dL (ref 6.5–8.1)

## 2019-10-06 LAB — CBC WITH DIFFERENTIAL (CANCER CENTER ONLY)
Abs Immature Granulocytes: 0.06 10*3/uL (ref 0.00–0.07)
Basophils Absolute: 0 10*3/uL (ref 0.0–0.1)
Basophils Relative: 1 %
Eosinophils Absolute: 0.5 10*3/uL (ref 0.0–0.5)
Eosinophils Relative: 7 %
HCT: 36.7 % — ABNORMAL LOW (ref 39.0–52.0)
Hemoglobin: 12.4 g/dL — ABNORMAL LOW (ref 13.0–17.0)
Immature Granulocytes: 1 %
Lymphocytes Relative: 34 %
Lymphs Abs: 2.2 10*3/uL (ref 0.7–4.0)
MCH: 28.2 pg (ref 26.0–34.0)
MCHC: 33.8 g/dL (ref 30.0–36.0)
MCV: 83.6 fL (ref 80.0–100.0)
Monocytes Absolute: 0.5 10*3/uL (ref 0.1–1.0)
Monocytes Relative: 8 %
Neutro Abs: 3.3 10*3/uL (ref 1.7–7.7)
Neutrophils Relative %: 49 %
Platelet Count: 197 10*3/uL (ref 150–400)
RBC: 4.39 MIL/uL (ref 4.22–5.81)
RDW: 17.4 % — ABNORMAL HIGH (ref 11.5–15.5)
WBC Count: 6.6 10*3/uL (ref 4.0–10.5)
nRBC: 0 % (ref 0.0–0.2)

## 2019-10-06 MED ORDER — SODIUM CHLORIDE 0.9 % IV SOLN
375.0000 mg/m2 | Freq: Once | INTRAVENOUS | Status: AC
  Administered 2019-10-06: 15:00:00 830 mg via INTRAVENOUS
  Filled 2019-10-06: qty 83

## 2019-10-06 MED ORDER — SODIUM CHLORIDE 0.9 % IV SOLN
150.0000 mg | Freq: Once | INTRAVENOUS | Status: AC
  Administered 2019-10-06: 150 mg via INTRAVENOUS
  Filled 2019-10-06: qty 150
  Filled 2019-10-06: qty 5

## 2019-10-06 MED ORDER — SODIUM CHLORIDE 0.9% FLUSH
10.0000 mL | INTRAVENOUS | Status: DC | PRN
  Administered 2019-10-06: 16:00:00 10 mL
  Filled 2019-10-06: qty 10

## 2019-10-06 MED ORDER — VINBLASTINE SULFATE CHEMO INJECTION 1 MG/ML
6.3000 mg/m2 | Freq: Once | INTRAVENOUS | Status: AC
  Administered 2019-10-06: 14 mg via INTRAVENOUS
  Filled 2019-10-06: qty 14

## 2019-10-06 MED ORDER — PALONOSETRON HCL INJECTION 0.25 MG/5ML
0.2500 mg | Freq: Once | INTRAVENOUS | Status: AC
  Administered 2019-10-06: 0.25 mg via INTRAVENOUS

## 2019-10-06 MED ORDER — PALONOSETRON HCL INJECTION 0.25 MG/5ML
INTRAVENOUS | Status: AC
  Filled 2019-10-06: qty 5

## 2019-10-06 MED ORDER — SODIUM CHLORIDE 0.9 % IV SOLN
Freq: Once | INTRAVENOUS | Status: AC
  Filled 2019-10-06: qty 250

## 2019-10-06 MED ORDER — HEPARIN SOD (PORK) LOCK FLUSH 100 UNIT/ML IV SOLN
500.0000 [IU] | Freq: Once | INTRAVENOUS | Status: AC | PRN
  Administered 2019-10-06: 16:00:00 500 [IU]
  Filled 2019-10-06: qty 5

## 2019-10-06 MED ORDER — SODIUM CHLORIDE 0.9 % IV SOLN
10.0000 mg | Freq: Once | INTRAVENOUS | Status: AC
  Administered 2019-10-06: 14:00:00 10 mg via INTRAVENOUS
  Filled 2019-10-06: qty 1
  Filled 2019-10-06: qty 10

## 2019-10-06 MED ORDER — DOXORUBICIN HCL CHEMO IV INJECTION 2 MG/ML
25.0000 mg/m2 | Freq: Once | INTRAVENOUS | Status: AC
  Administered 2019-10-06: 15:00:00 56 mg via INTRAVENOUS
  Filled 2019-10-06: qty 28

## 2019-10-06 NOTE — Patient Instructions (Signed)
[facility] Discharge Instructions for Patients Receiving Chemotherapy  Today you received the following chemotherapy agents: Adriamycin/Vinblastine/Dacarbazine.  To help prevent nausea and vomiting after your treatment, we encourage you to take your nausea medication as directed.   If you develop nausea and vomiting that is not controlled by your nausea medication, call the clinic.   BELOW ARE SYMPTOMS THAT SHOULD BE REPORTED IMMEDIATELY:  *FEVER GREATER THAN 100.5 F  *CHILLS WITH OR WITHOUT FEVER  NAUSEA AND VOMITING THAT IS NOT CONTROLLED WITH YOUR NAUSEA MEDICATION  *UNUSUAL SHORTNESS OF BREATH  *UNUSUAL BRUISING OR BLEEDING  TENDERNESS IN MOUTH AND THROAT WITH OR WITHOUT PRESENCE OF ULCERS  *URINARY PROBLEMS  *BOWEL PROBLEMS  UNUSUAL RASH Items with * indicate a potential emergency and should be followed up as soon as possible.  Feel free to call the clinic should you have any questions or concerns. The clinic phone number is [phone].  Please show the CHEMO ALERT CARD at check-in to the Emergency Department and triage nurse.   

## 2019-10-06 NOTE — Progress Notes (Signed)
[place] OFFICE PROGRESS NOTE  Patient, No Pcp Per No address on file  DIAGNOSIS: Classical Hodgkin Lymphoma, Nodular Sclerosis subtype. He presented with bulky bilateral cervical lymphadenopathy, mediastinal lymphadenopathy, and mildly enlarged retroperitoneal periaortic adenopathy and iliac adenopathy. He also presented with involvement of the spleen. He was diagnosed in December 2020.  PRIOR THERAPY: None  CURRENT THERAPY: Systemic chemotherapy with doxorubicin, dacarbazine, vinblastine, and bleomycin on days 1 and 15 every 4 weeks. First dose expected on January 7th, 2021.Statuspostday 1 cycle4.Starting from cycle #4, bleomycin will be dropped.   INTERVAL HISTORY: KAHLER WEHBE 19 y.o. male returns to the clinic for a follow up appointment. The patient is feeling fair today except 3-4 days of nasal congestion. The patient denies seasonal allergies. He has not tried any over the counter products for his nasal congestion. He denies any sick contacts.  He denies any associated fevers, sore throats, cough, or shortness of breath.  He denies any loss of taste or smell. Otherwise, the patient continues to tolerate treatment with chemotherapywell without any adverse sideeffects. Denies any chills, night sweats, or weight loss. Denies any chest pain or hemoptysis. Denies any nausea, vomiting, diarrhea, or constipation. Denies any headache or visual changes. He denies any peripheral neuropathy. He denies any abnormal bleeding or bruising including melena, hematochezia, hematuria, epistaxis, or gingival bleeding.The patient is here today for evaluation prior to starting cycle # day 15 cycle 4  MEDICAL HISTORY:No past medical history on file.  ALLERGIES:  has No Known Allergies.  MEDICATIONS:  Current Outpatient Medications  Medication Sig Dispense Refill  . allopurinol (ZYLOPRIM) 100 MG tablet Take 1 tablet (100 mg total) by mouth 2 (two) times daily. 60 tablet 2   . lidocaine-prilocaine (EMLA) cream Apply 1 application topically as needed. (Patient not taking: Reported on 09/21/2019) 30 g 1  . prochlorperazine (COMPAZINE) 10 MG tablet Take 1 tablet (10 mg total) by mouth every 6 (six) hours as needed for nausea or vomiting. (Patient not taking: Reported on 09/21/2019) 30 tablet 0   No current facility-administered medications for this visit.    SURGICAL HISTORY:  Past Surgical History:  Procedure Laterality Date  . IR IMAGING GUIDED PORT INSERTION  06/30/2019  . IR US GUIDE BX ASP/DRAIN  05/27/2019    REVIEW OF SYSTEMS:   Review of Systems  Constitutional: Negative for appetite change, chills, fatigue, fever and unexpected weight change.  HENT: Positive for nasal congestion.  Negative for mouth sores, nosebleeds, sore throat and trouble swallowing.   Eyes: Negative for eye problems and icterus.  Respiratory: Negative for cough, hemoptysis, shortness of breath and wheezing.  Cardiovascular: Negative for chest pain and leg swelling.  Gastrointestinal: Negative for abdominal pain, constipation, diarrhea, nausea and vomiting.  Genitourinary: Negative for bladder incontinence, difficulty urinating, dysuria, frequency and hematuria.   Musculoskeletal: Negative for back pain, gait problem, neck pain and neck stiffness.  Skin:  Negative for itching or rash  Neurological: Negative for dizziness, extremity weakness, gait problem, headaches, light-headedness and seizures.  Hematological: Negative for adenopathy. Does not bruise/bleed easily.  Psychiatric/Behavioral: Negative for confusion, depression and sleep disturbance. The patient is not nervous/anxious.     PHYSICAL EXAMINATION:  Blood pressure 118/66, pulse 73, temperature 98.5 F (36.9 C), temperature source Oral, resp. rate 18, height 6' (1.829 m), weight 260 lb 3.2 oz (118 kg), SpO2 100 %.  ECOG PERFORMANCE STATUS: 1 - Symptomatic but completely ambulatory  Physical Exam  Constitutional:  Oriented to person, place, and time and  well-developed, well-nourished, and in no distress.  HENT:  Head: Normocephalic and atraumatic.  Mouth/Throat: Oropharynx is clear and moist. No oropharyngeal exudate.  Eyes: Conjunctivae are normal. Right eye exhibits no discharge. Left eye exhibits no discharge. No scleral icterus.  Neck: Normal range of motion. Neck supple.  Cardiovascular: Normal rate, regular rhythm, normal heart sounds and intact distal pulses.   Pulmonary/Chest: Effort normal and breath sounds normal. No respiratory distress. No wheezes. No rales.  Abdominal: Soft. Bowel sounds are normal. Exhibits no distension and no mass. There is no tenderness.  Musculoskeletal: Normal range of motion. Exhibits no edema.  Lymphadenopathy:    No cervical adenopathy.  Neurological: Alert and oriented to person, place, and time. Exhibits normal muscle tone. Gait normal. Coordination normal.  Skin: Skin is warm and dry. No rash noted. Not diaphoretic. No erythema. No pallor.  Psychiatric: Mood, memory and judgment normal.  Vitals reviewed.  LABORATORY DATA: Lab Results  Component Value Date   WBC 6.6 10/06/2019   HGB 12.4 (L) 10/06/2019   HCT 36.7 (L) 10/06/2019   MCV 83.6 10/06/2019   PLT 197 10/06/2019      Chemistry      Component Value Date/Time   NA 140 10/06/2019 1125   K 4.1 10/06/2019 1125   CL 105 10/06/2019 1125   CO2 28 10/06/2019 1125   BUN 11 10/06/2019 1125   CREATININE 0.77 10/06/2019 1125      Component Value Date/Time   CALCIUM 8.9 10/06/2019 1125   ALKPHOS 56 10/06/2019 1125   AST 18 10/06/2019 1125   ALT 30 10/06/2019 1125   BILITOT 0.3 10/06/2019 1125       RADIOGRAPHIC STUDIES:  No results found.   ASSESSMENT/PLAN:  This is a very pleasant 19 year old male diagnosed with classical Hodgkin's lymphoma, nodular sclerosing subtype. He presented with bulky bilateral cervical lymphadenopathy, mediastinal lymphadenopathy, and mildly enlarged  retroperitoneal,periaortic, and iliac adenopathy. He also presents with spleen involvement. He was diagnosed in December 2020.  The patient is currently undergoing systemic chemotherapy with ABVD(doxorubicin, dacarbazine, vinblastine, and bleomycin)on days 1 and 15 every 4 weeks. He is status post day 15 cycle4. He is tolerating treatment well except formild nausea with cycle #1 which is controlled with his anti-emetic. Starting from cycle #4, bleomycin has been dropped.   The patient was seen with Dr. Julien Nordmann today.  Labs were reviewed.  Recommend he proceed with day 1 of cycle 5 today as scheduled.  We will see him back for follow-up visit in 2 weeks for evaluation before starting day 15 of cycle #5.  Regarding his nasal congestion, advised the patient to use antihistamines such as Zyrtec, Claritin, or Allegra.  Also encourage the patient to use Mucinex and saline nasal spray.  Advised patient to call us if he develops any new or worsening symptoms including fever, sore throat, cough, shortness of breath, etc.  The patient was advised to call immediately if he has any concerning symptoms in the interval. The patient voices understanding of current disease status and treatment options and is in agreement with the current care plan. All questions were answered. The patient knows to call the clinic with any problems, questions or concerns. We can certainly see the patient much sooner if necessary  No orders of the defined types were placed in this encounter.    Newt Levingston L Molly Savarino, PA-C 10/06/19  ADDENDUM: Hematology/Oncology Attending: I had a face-to-face encounter with the patient today.  I recommended his care plan.  This is a  very pleasant 18 years old male diagnosed with stage III classical Hodgkin lymphoma, nodular sclerosing subtype.  The patient is currently undergoing treatment with systemic chemotherapy initially with ABVD for 3 cycles with significant improvement of his  disease. He is currently undergoing systemic chemotherapy with AVD starting from cycle #4.  He has been tolerating the treatment well. I recommended for the patient to proceed with day 1 of cycle #6 today as planned. We will see the patient back for follow-up visit in 2 weeks for evaluation before day 15 of cycle #5. He was strongly advised to keep his chemo and follow-up appointment as planned. He was also advised to call immediately if he has any concerning symptoms in the interval.  Disclaimer: This note was dictated with voice recognition software. Similar sounding words can inadvertently be transcribed and may be missed upon review. Eilleen Kempf, MD 10/06/19

## 2019-10-13 ENCOUNTER — Other Ambulatory Visit: Payer: Medicaid Other

## 2019-10-13 ENCOUNTER — Ambulatory Visit: Payer: Medicaid Other | Admitting: Internal Medicine

## 2019-10-13 ENCOUNTER — Ambulatory Visit: Payer: Medicaid Other

## 2019-10-13 NOTE — Progress Notes (Signed)
Pharmacist Chemotherapy Monitoring - Follow Up Assessment    I verify that I have reviewed each item in the below checklist:  . Regimen for the patient is scheduled for the appropriate day and plan matches scheduled date. Marland Kitchen Appropriate non-routine labs are ordered dependent on drug ordered. . If applicable, additional medications reviewed and ordered per protocol based on lifetime cumulative doses and/or treatment regimen.   Plan for follow-up and/or issues identified: No . I-vent associated with next due treatment: No . MD and/or nursing notified: No  Philomena Course 10/13/2019 8:04 AM

## 2019-10-17 NOTE — Progress Notes (Deleted)
[place] OFFICE PROGRESS NOTE  Patient, No Pcp Per No address on file  DIAGNOSIS: Classical Hodgkin Lymphoma, Nodular Sclerosis subtype. He presented with bulky bilateral cervical lymphadenopathy, mediastinal lymphadenopathy, and mildly enlarged retroperitoneal periaortic adenopathy and iliac adenopathy. He also presented with involvement of the spleen. He was diagnosed in December 2020.  PRIOR THERAPY: None  CURRENT THERAPY: Systemic chemotherapy with doxorubicin, dacarbazine, vinblastine, and bleomycin on days 1 and 15 every 4 weeks. First dose expected on January 7th, 2021.Statuspostday 1 cycle5.Starting from cycle #4, bleomycin will be dropped.  INTERVAL HISTORY: Terry Quinn 19 y.o. male returns to the clinic for a follow up appointment.The patient is feeling fair today except for __.  Otherwise, the patient continues to tolerate treatment with chemotherapywell without any adverse sideeffects. Denies any chills, night sweats, or weight loss. Denies any chest pain or hemoptysis. Denies any nausea, vomiting, diarrhea, or constipation. Denies any headache or visual changes. He denies any peripheral neuropathy. He denies any abnormal bleeding or bruising including melena, hematochezia, hematuria, epistaxis, or gingival bleeding.The patient is here today for evaluation prior to starting day 15 cycle5    MEDICAL HISTORY:No past medical history on file.  ALLERGIES:  has No Known Allergies.  MEDICATIONS:  Current Outpatient Medications  Medication Sig Dispense Refill  . allopurinol (ZYLOPRIM) 100 MG tablet Take 1 tablet (100 mg total) by mouth 2 (two) times daily. 60 tablet 2  . lidocaine-prilocaine (EMLA) cream Apply 1 application topically as needed. (Patient not taking: Reported on 09/21/2019) 30 g 1  . prochlorperazine (COMPAZINE) 10 MG tablet Take 1 tablet (10 mg total) by mouth every 6 (six) hours as needed for nausea or vomiting. (Patient not taking:  Reported on 09/21/2019) 30 tablet 0   No current facility-administered medications for this visit.    SURGICAL HISTORY:  Past Surgical History:  Procedure Laterality Date  . IR IMAGING GUIDED PORT INSERTION  06/30/2019  . IR US GUIDE BX ASP/DRAIN  05/27/2019    REVIEW OF SYSTEMS:   Review of Systems  Constitutional: Negative for appetite change, chills, fatigue, fever and unexpected weight change.  HENT:   Negative for mouth sores, nosebleeds, sore throat and trouble swallowing.   Eyes: Negative for eye problems and icterus.  Respiratory: Negative for cough, hemoptysis, shortness of breath and wheezing.   Cardiovascular: Negative for chest pain and leg swelling.  Gastrointestinal: Negative for abdominal pain, constipation, diarrhea, nausea and vomiting.  Genitourinary: Negative for bladder incontinence, difficulty urinating, dysuria, frequency and hematuria.   Musculoskeletal: Negative for back pain, gait problem, neck pain and neck stiffness.  Skin: Negative for itching and rash.  Neurological: Negative for dizziness, extremity weakness, gait problem, headaches, light-headedness and seizures.  Hematological: Negative for adenopathy. Does not bruise/bleed easily.  Psychiatric/Behavioral: Negative for confusion, depression and sleep disturbance. The patient is not nervous/anxious.     PHYSICAL EXAMINATION:  There were no vitals taken for this visit.  ECOG PERFORMANCE STATUS: {CHL ONC ECOG [id number]  Physical Exam  Constitutional: Oriented to person, place, and time and well-developed, well-nourished, and in no distress. No distress.  HENT:  Head: Normocephalic and atraumatic.  Mouth/Throat: Oropharynx is clear and moist. No oropharyngeal exudate.  Eyes: Conjunctivae are normal. Right eye exhibits no discharge. Left eye exhibits no discharge. No scleral icterus.  Neck: Normal range of motion. Neck supple.  Cardiovascular: Normal rate, regular rhythm, normal heart sounds and  intact distal pulses.   Pulmonary/Chest: Effort normal and breath sounds normal. No respiratory distress. No  wheezes. No rales.  Abdominal: Soft. Bowel sounds are normal. Exhibits no distension and no mass. There is no tenderness.  Musculoskeletal: Normal range of motion. Exhibits no edema.  Lymphadenopathy:    No cervical adenopathy.  Neurological: Alert and oriented to person, place, and time. Exhibits normal muscle tone. Gait normal. Coordination normal.  Skin: Skin is warm and dry. No rash noted. Not diaphoretic. No erythema. No pallor.  Psychiatric: Mood, memory and judgment normal.  Vitals reviewed.  LABORATORY DATA: Lab Results  Component Value Date   WBC 6.6 10/06/2019   HGB 12.4 (L) 10/06/2019   HCT 36.7 (L) 10/06/2019   MCV 83.6 10/06/2019   PLT 197 10/06/2019      Chemistry      Component Value Date/Time   NA 140 10/06/2019 1125   K 4.1 10/06/2019 1125   CL 105 10/06/2019 1125   CO2 28 10/06/2019 1125   BUN 11 10/06/2019 1125   CREATININE 0.77 10/06/2019 1125      Component Value Date/Time   CALCIUM 8.9 10/06/2019 1125   ALKPHOS 56 10/06/2019 1125   AST 18 10/06/2019 1125   ALT 30 10/06/2019 1125   BILITOT 0.3 10/06/2019 1125       RADIOGRAPHIC STUDIES:  No results found.   ASSESSMENT/PLAN:  This is a very pleasant 19 year old male diagnosed with classical Hodgkin's lymphoma, nodular sclerosing subtype. He presented with bulky bilateral cervical lymphadenopathy, mediastinal lymphadenopathy, and mildly enlarged retroperitoneal,periaortic, and iliac adenopathy. He also presents with spleen involvement. He was diagnosed in December 2020.  The patient is currently undergoing systemic chemotherapy with ABVD(doxorubicin, dacarbazine, vinblastine, and bleomycin)on days 1 and 15 every 4 weeks. He is status post day 1 cycle5. He is tolerating treatment well except formild nausea with cycle #1 which is controlled with his anti-emetic.Starting from cycle #4,  bleomycin has been dropped.  The patient was seen with Dr. Julien Nordmann today.  Labs were reviewed.  Recommend he proceed with day 15 of cycle 5 today as scheduled.  We will see him back for follow-up visit in 2 weeks for evaluation before starting day 1 of cycle #6.  The patient was advised to call immediately if he has any concerning symptoms in the interval. The patient voices understanding of current disease status and treatment options and is in agreement with the current care plan. All questions were answered. The patient knows to call the clinic with any problems, questions or concerns. We can certainly see the patient much sooner if necessary  No orders of the defined types were placed in this encounter.    Natividad Schlosser L Ziquan Fidel, PA-C 10/17/19

## 2019-10-19 ENCOUNTER — Inpatient Hospital Stay: Payer: Medicaid Other

## 2019-10-19 ENCOUNTER — Ambulatory Visit: Payer: Medicaid Other

## 2019-10-19 ENCOUNTER — Telehealth: Payer: Self-pay | Admitting: Internal Medicine

## 2019-10-19 ENCOUNTER — Inpatient Hospital Stay: Payer: Medicaid Other | Admitting: Physician Assistant

## 2019-10-19 ENCOUNTER — Telehealth: Payer: Self-pay | Admitting: *Deleted

## 2019-10-19 NOTE — Telephone Encounter (Signed)
Scheduled appt per 5/17 sch message - unable to reach pt . Left message with appt date and time

## 2019-10-19 NOTE — Telephone Encounter (Signed)
Patient didn't show for first appt of the day.  Called and spoke with patient.  He states he wont be able to come in for his treatment and wants to reschedule.  Routed to MD to advise.

## 2019-10-21 ENCOUNTER — Inpatient Hospital Stay: Payer: Medicaid Other

## 2019-10-21 ENCOUNTER — Inpatient Hospital Stay (HOSPITAL_BASED_OUTPATIENT_CLINIC_OR_DEPARTMENT_OTHER): Payer: Medicaid Other | Admitting: Internal Medicine

## 2019-10-21 ENCOUNTER — Encounter: Payer: Self-pay | Admitting: Internal Medicine

## 2019-10-21 ENCOUNTER — Other Ambulatory Visit: Payer: Self-pay

## 2019-10-21 VITALS — BP 117/65 | HR 78 | Temp 97.9°F | Resp 18 | Ht 72.0 in | Wt 263.2 lb

## 2019-10-21 DIAGNOSIS — C8111 Nodular sclerosis classical Hodgkin lymphoma, lymph nodes of head, face, and neck: Secondary | ICD-10-CM | POA: Diagnosis not present

## 2019-10-21 DIAGNOSIS — Z5111 Encounter for antineoplastic chemotherapy: Secondary | ICD-10-CM | POA: Diagnosis not present

## 2019-10-21 DIAGNOSIS — Z95828 Presence of other vascular implants and grafts: Secondary | ICD-10-CM

## 2019-10-21 LAB — CMP (CANCER CENTER ONLY)
ALT: 32 U/L (ref 0–44)
AST: 21 U/L (ref 15–41)
Albumin: 4.3 g/dL (ref 3.5–5.0)
Alkaline Phosphatase: 63 U/L (ref 38–126)
Anion gap: 10 (ref 5–15)
BUN: 10 mg/dL (ref 6–20)
CO2: 27 mmol/L (ref 22–32)
Calcium: 9.3 mg/dL (ref 8.9–10.3)
Chloride: 102 mmol/L (ref 98–111)
Creatinine: 0.79 mg/dL (ref 0.61–1.24)
GFR, Est AFR Am: >60 mL/min (ref >60)
GFR, Estimated: >60 mL/min (ref >60)
Glucose, Bld: 95 mg/dL (ref 70–99)
Potassium: 4.2 mmol/L (ref 3.5–5.1)
Sodium: 139 mmol/L (ref 135–145)
Total Bilirubin: 0.2 mg/dL — ABNORMAL LOW (ref 0.3–1.2)
Total Protein: 7.2 g/dL (ref 6.5–8.1)

## 2019-10-21 LAB — CBC WITH DIFFERENTIAL (CANCER CENTER ONLY)
Abs Immature Granulocytes: 0.04 10*3/uL (ref 0.00–0.07)
Basophils Absolute: 0 10*3/uL (ref 0.0–0.1)
Basophils Relative: 0 %
Eosinophils Absolute: 0.2 10*3/uL (ref 0.0–0.5)
Eosinophils Relative: 3 %
HCT: 39.2 % (ref 39.0–52.0)
Hemoglobin: 13.2 g/dL (ref 13.0–17.0)
Immature Granulocytes: 1 %
Lymphocytes Relative: 16 %
Lymphs Abs: 1.1 10*3/uL (ref 0.7–4.0)
MCH: 28.8 pg (ref 26.0–34.0)
MCHC: 33.7 g/dL (ref 30.0–36.0)
MCV: 85.4 fL (ref 80.0–100.0)
Monocytes Absolute: 0.8 10*3/uL (ref 0.1–1.0)
Monocytes Relative: 12 %
Neutro Abs: 4.7 10*3/uL (ref 1.7–7.7)
Neutrophils Relative %: 68 %
Platelet Count: 161 10*3/uL (ref 150–400)
RBC: 4.59 MIL/uL (ref 4.22–5.81)
RDW: 16.9 % — ABNORMAL HIGH (ref 11.5–15.5)
WBC Count: 6.9 10*3/uL (ref 4.0–10.5)
nRBC: 0 % (ref 0.0–0.2)

## 2019-10-21 MED ORDER — PALONOSETRON HCL INJECTION 0.25 MG/5ML
0.2500 mg | Freq: Once | INTRAVENOUS | Status: AC
  Administered 2019-10-21: 0.25 mg via INTRAVENOUS

## 2019-10-21 MED ORDER — SODIUM CHLORIDE 0.9 % IV SOLN
150.0000 mg | Freq: Once | INTRAVENOUS | Status: AC
  Administered 2019-10-21: 150 mg via INTRAVENOUS
  Filled 2019-10-21: qty 5
  Filled 2019-10-21: qty 150

## 2019-10-21 MED ORDER — DOXORUBICIN HCL CHEMO IV INJECTION 2 MG/ML
25.0000 mg/m2 | Freq: Once | INTRAVENOUS | Status: AC
  Administered 2019-10-21: 56 mg via INTRAVENOUS
  Filled 2019-10-21: qty 28

## 2019-10-21 MED ORDER — SODIUM CHLORIDE 0.9% FLUSH
10.0000 mL | INTRAVENOUS | Status: DC | PRN
  Administered 2019-10-21: 10 mL via INTRAVENOUS
  Filled 2019-10-21: qty 10

## 2019-10-21 MED ORDER — PALONOSETRON HCL INJECTION 0.25 MG/5ML
INTRAVENOUS | Status: AC
  Filled 2019-10-21: qty 5

## 2019-10-21 MED ORDER — SODIUM CHLORIDE 0.9 % IV SOLN
375.0000 mg/m2 | Freq: Once | INTRAVENOUS | Status: AC
  Administered 2019-10-21: 830 mg via INTRAVENOUS
  Filled 2019-10-21: qty 83

## 2019-10-21 MED ORDER — HEPARIN SOD (PORK) LOCK FLUSH 100 UNIT/ML IV SOLN
500.0000 [IU] | Freq: Once | INTRAVENOUS | Status: AC | PRN
  Administered 2019-10-21: 500 [IU]
  Filled 2019-10-21: qty 5

## 2019-10-21 MED ORDER — SODIUM CHLORIDE 0.9% FLUSH
10.0000 mL | INTRAVENOUS | Status: DC | PRN
  Administered 2019-10-21: 10 mL
  Filled 2019-10-21: qty 10

## 2019-10-21 MED ORDER — SODIUM CHLORIDE 0.9 % IV SOLN
10.0000 mg | Freq: Once | INTRAVENOUS | Status: AC
  Administered 2019-10-21: 10 mg via INTRAVENOUS
  Filled 2019-10-21: qty 10
  Filled 2019-10-21: qty 1

## 2019-10-21 MED ORDER — SODIUM CHLORIDE 0.9 % IV SOLN
Freq: Once | INTRAVENOUS | Status: AC
  Filled 2019-10-21: qty 250

## 2019-10-21 MED ORDER — VINBLASTINE SULFATE CHEMO INJECTION 1 MG/ML
6.3000 mg/m2 | Freq: Once | INTRAVENOUS | Status: AC
  Administered 2019-10-21: 14 mg via INTRAVENOUS
  Filled 2019-10-21: qty 14

## 2019-10-21 NOTE — Progress Notes (Signed)
[place] [phone]   [phone]  OFFICE PROGRESS NOTE  Patient, No Pcp Per No address on file  DIAGNOSIS: Classical Hodgkin Lymphoma, Nodular Sclerosis subtype. He presented with bulky bilateral cervical lymphadenopathy, mediastinal lymphadenopathy, and mildly enlarged retroperitoneal periaortic adenopathy and iliac adenopathy. He also presented with involvement of the spleen. He was diagnosed in December 2020.   PRIOR THERAPY: None   CURRENT THERAPY: Systemic chemotherapy with doxorubicin, dacarbazine, vinblastine, and bleomycin on days 1 and 15 every 4 weeks.  First dose expected on January 7th, 2021.  Status post 4 cycles.  Bleomycin was discontinued after cycle #3.  INTERVAL HISTORY: Terry Quinn 19 y.o. male returns to the clinic today for follow-up visit.  The patient is feeling fine today with no concerning complaints.  He denied having any current chest pain, shortness of breath, cough or hemoptysis.  He denied having any fever or chills.  He has no nausea, vomiting, diarrhea or constipation.  He continues to have chest congestion secondary to seasonal allergy.  He is currently on Zyrtec that make him sleepy.  He denied having any recent weight loss or night sweats.  He is here today for evaluation before starting day 15 of cycle #5.  MEDICAL HISTORY:No past medical history on file.  ALLERGIES:  has No Known Allergies.  MEDICATIONS:  Current Outpatient Medications  Medication Sig Dispense Refill  . allopurinol (ZYLOPRIM) 100 MG tablet Take 1 tablet (100 mg total) by mouth 2 (two) times daily. 60 tablet 2  . lidocaine-prilocaine (EMLA) cream Apply 1 application topically as needed. (Patient not taking: Reported on 09/21/2019) 30 g 1  . prochlorperazine (COMPAZINE) 10 MG tablet Take 1 tablet (10 mg total) by mouth every 6 (six) hours as needed for nausea or vomiting. (Patient not taking: Reported on 09/21/2019) 30 tablet 0   No  current facility-administered medications for this visit.    SURGICAL HISTORY:  Past Surgical History:  Procedure Laterality Date  . IR IMAGING GUIDED PORT INSERTION  06/30/2019  . IR US GUIDE BX ASP/DRAIN  05/27/2019    REVIEW OF SYSTEMS:  A comprehensive review of systems was negative except for: Ears, nose, mouth, throat, and face: positive for nasal congestion   PHYSICAL EXAMINATION: General appearance: alert, cooperative and no distress Head: Normocephalic, without obvious abnormality, atraumatic Neck: no carotid bruit, no JVD and supple, symmetrical, trachea midline Lymph nodes: Cervical, supraclavicular, and axillary nodes normal. Resp: clear to auscultation bilaterally Back: symmetric, no curvature. ROM normal. No CVA tenderness. Cardio: regular rate and rhythm, S1, S2 normal, no murmur, click, rub or gallop GI: soft, non-tender; bowel sounds normal; no masses,  no organomegaly Extremities: extremities normal, atraumatic, no cyanosis or edema  ECOG PERFORMANCE STATUS: 1 - Symptomatic but completely ambulatory  Blood pressure 117/65, pulse 78, temperature 97.9 F (36.6 C), temperature source Temporal, resp. rate 18, height 6' (1.829 m), weight 263 lb 3.2 oz (119.4 kg), SpO2 100 %.  LABORATORY DATA: Lab Results  Component Value Date   WBC 6.9 10/21/2019   HGB 13.2 10/21/2019   HCT 39.2 10/21/2019   MCV 85.4 10/21/2019   PLT 161 10/21/2019      Chemistry      Component Value Date/Time   NA 139 10/21/2019 1040   K 4.2 10/21/2019 1040   CL 102 10/21/2019 1040   CO2 27 10/21/2019 1040   BUN 10 10/21/2019 1040   CREATININE 0.79 10/21/2019 1040      Component  Value Date/Time   CALCIUM 9.3 10/21/2019 1040   ALKPHOS 63 10/21/2019 1040   AST 21 10/21/2019 1040   ALT 32 10/21/2019 1040   BILITOT 0.2 (L) 10/21/2019 1040       RADIOGRAPHIC STUDIES: No results found.  ASSESSMENT AND PLAN:  This is a very pleasant 19 year old male diagnosed with Classical Hodgkin  Lymphoma, Nodular Sclerosis subtype. He presented with bulky bilateral cervical lymphadenopathy, mediastinal lymphadenopathy, and mildly enlarged retroperitoneal periaortic adenopathy and iliac adenopathy. He also presented with involvement of the spleen. He was diagnosed in December 2020.  The patient is currently undergoing treatment with ABVD status post 4 cycles.  He noticed significant improvement in his symptoms after starting the chemotherapy. Repeat PET scan after cycle #2 showed marked improvement in the previously demonstrated hypermetabolic activity in the neck, chest, abdomen and pelvis with Deauville 2. The patient has been tolerating his treatment fairly well with no concerning complaints. Starting from cycle #4 we will discontinue his treatment with bleomycin and the patient will continue the other component of his treatment with AVD every 2 weeks.  He will proceed with day 1 of cycle #4 today. The patient continues to tolerate this treatment well with no concerning adverse effects. I recommended for him to proceed with day 15 of cycle #5 today as planned. He will come back for follow-up visit in 2 weeks for evaluation before starting day 1 of cycle #6. The patient was advised to call immediately if he has any concerning symptoms in the interval. The patient voices understanding of current disease status and treatment options and is in agreement with the current care plan.  All questions were answered. The patient knows to call the clinic with any problems, questions or concerns. We can certainly see the patient much sooner if necessary.  Disclaimer: This note was dictated with voice recognition software. Similar sounding words can inadvertently be transcribed and may not be corrected upon review.

## 2019-10-21 NOTE — Progress Notes (Signed)
Called Customer Service(Bernita) to address patient's issue regarding having a balance and now having Medicaid.  Sharolyn Douglas states she is submitting claims to [facility] for dates of service December, January, and February and to allow 30 days for processing.  Advised patient this would be taken care of with the exception of copay for physician visits. He verbalized understanding and was very Patent attorney.  Advised him to contact the number on the bill for any bills received outside of [facility] to provide them the Medicaid # and to have them submit those claims to Medicaid as well.     He has my card for any additonal financial questions or concerns.

## 2019-10-27 ENCOUNTER — Telehealth: Payer: Self-pay | Admitting: Internal Medicine

## 2019-10-27 NOTE — Telephone Encounter (Signed)
Scheduled per los. Called and spoke with patient. Confirmed appt 

## 2019-10-31 NOTE — Progress Notes (Deleted)
Terry Quinn OFFICE PROGRESS NOTE  Patient, No Pcp Per No address on file  DIAGNOSIS: Classical Hodgkin Lymphoma, Nodular Sclerosis subtype. He presented with bulky bilateral cervical lymphadenopathy, mediastinal lymphadenopathy, and mildly enlarged retroperitoneal periaortic adenopathy and iliac adenopathy. He also presented with involvement of the spleen. He was diagnosed in December 2020.  PRIOR THERAPY: None  CURRENT THERAPY: Systemic chemotherapy with doxorubicin, dacarbazine, vinblastine, and bleomycin on days 1 and 15 every 4 weeks. First dose expected on January 7th, 2021.Statuspostday 15 cycle5.Starting from cycle #4, bleomycin will be dropped.  INTERVAL HISTORY: Terry Quinn 19 y.o. male returns to the clinic for a follow up appointment.The patient is feeling fair today except __.  Regarding his treatment, the patient continues to tolerate treatment with chemotherapywell without any adverse sideeffects. Denies any chills, night sweats, or weight loss. Denies any chest pain or hemoptysis. Denies any nausea, vomiting, diarrhea, or constipation. Denies any headache or visual changes. He denies any peripheral neuropathy. He denies any abnormal bleeding or bruising including melena, hematochezia, hematuria, epistaxis, or gingival bleeding.The patient is here today for evaluation prior to starting cycle # day 1 cycle6  MEDICAL HISTORY:No past medical history on file.  ALLERGIES:  has No Known Allergies.  MEDICATIONS:  Current Outpatient Medications  Medication Sig Dispense Refill  . allopurinol (ZYLOPRIM) 100 MG tablet Take 1 tablet (100 mg total) by mouth 2 (two) times daily. 60 tablet 2  . cetirizine (ZYRTEC) 10 MG tablet Take 10 mg by mouth daily.    Marland Kitchen lidocaine-prilocaine (EMLA) cream Apply 1 application topically as needed. 30 g 1  . prochlorperazine (COMPAZINE) 10 MG tablet Take 1 tablet (10 mg total) by mouth every 6 (six) hours as needed for  nausea or vomiting. (Patient not taking: Reported on 09/21/2019) 30 tablet 0   No current facility-administered medications for this visit.    SURGICAL HISTORY:  Past Surgical History:  Procedure Laterality Date  . IR IMAGING GUIDED PORT INSERTION  06/30/2019  . IR US GUIDE BX ASP/DRAIN  05/27/2019    REVIEW OF SYSTEMS:   Review of Systems  Constitutional: Negative for appetite change, chills, fatigue, fever and unexpected weight change.  HENT:   Negative for mouth sores, nosebleeds, sore throat and trouble swallowing.   Eyes: Negative for eye problems and icterus.  Respiratory: Negative for cough, hemoptysis, shortness of breath and wheezing.   Cardiovascular: Negative for chest pain and leg swelling.  Gastrointestinal: Negative for abdominal pain, constipation, diarrhea, nausea and vomiting.  Genitourinary: Negative for bladder incontinence, difficulty urinating, dysuria, frequency and hematuria.   Musculoskeletal: Negative for back pain, gait problem, neck pain and neck stiffness.  Skin: Negative for itching and rash.  Neurological: Negative for dizziness, extremity weakness, gait problem, headaches, light-headedness and seizures.  Hematological: Negative for adenopathy. Does not bruise/bleed easily.  Psychiatric/Behavioral: Negative for confusion, depression and sleep disturbance. The patient is not nervous/anxious.     PHYSICAL EXAMINATION:  There were no vitals taken for this visit.  ECOG PERFORMANCE STATUS: {CHL ONC ECOG [id number]  Physical Exam  Constitutional: Oriented to person, place, and time and well-developed, well-nourished, and in no distress. No distress.  HENT:  Head: Normocephalic and atraumatic.  Mouth/Throat: Oropharynx is clear and moist. No oropharyngeal exudate.  Eyes: Conjunctivae are normal. Right eye exhibits no discharge. Left eye exhibits no discharge. No scleral icterus.  Neck: Normal range of motion. Neck supple.  Cardiovascular: Normal rate,  regular rhythm, normal heart sounds and intact distal pulses.   Pulmonary/Chest:  Effort normal and breath sounds normal. No respiratory distress. No wheezes. No rales.  Abdominal: Soft. Bowel sounds are normal. Exhibits no distension and no mass. There is no tenderness.  Musculoskeletal: Normal range of motion. Exhibits no edema.  Lymphadenopathy:    No cervical adenopathy.  Neurological: Alert and oriented to person, place, and time. Exhibits normal muscle tone. Gait normal. Coordination normal.  Skin: Skin is warm and dry. No rash noted. Not diaphoretic. No erythema. No pallor.  Psychiatric: Mood, memory and judgment normal.  Vitals reviewed.  LABORATORY DATA: Lab Results  Component Value Date   WBC 6.9 10/21/2019   HGB 13.2 10/21/2019   HCT 39.2 10/21/2019   MCV 85.4 10/21/2019   PLT 161 10/21/2019      Chemistry      Component Value Date/Time   NA 139 10/21/2019 1040   K 4.2 10/21/2019 1040   CL 102 10/21/2019 1040   CO2 27 10/21/2019 1040   BUN 10 10/21/2019 1040   CREATININE 0.79 10/21/2019 1040      Component Value Date/Time   CALCIUM 9.3 10/21/2019 1040   ALKPHOS 63 10/21/2019 1040   AST 21 10/21/2019 1040   ALT 32 10/21/2019 1040   BILITOT 0.2 (L) 10/21/2019 1040       RADIOGRAPHIC STUDIES:  No results found.   ASSESSMENT/PLAN:  This is a very pleasant 19 year old male diagnosed with classical Hodgkin's lymphoma, nodular sclerosing subtype. He presented with bulky bilateral cervical lymphadenopathy, mediastinal lymphadenopathy, and mildly enlarged retroperitoneal,periaortic, and iliac adenopathy. He also presents with spleen involvement. He was diagnosed in December 2020.  The patient is currently undergoing systemic chemotherapy with ABVD(doxorubicin, dacarbazine, vinblastine, and bleomycin)on days 1 and 15 every 4 weeks. He is status post day 15 cycle5. He is tolerating treatment well except formild nausea with cycle #1 which is controlled with his  anti-emetic.Starting from cycle #4, bleomycin has been dropped.  The patient was seen with Dr. Julien Nordmann today.  Labs were reviewed.  Recommend he proceed with day 1 of cycle 6 today as scheduled.  We will see him back for follow-up visit in 2 weeks for evaluation before starting day 15 of cycle #6.  The patient was advised to call immediately if he has any concerning symptoms in the interval. The patient voices understanding of current disease status and treatment options and is in agreement with the current care plan. All questions were answered. The patient knows to call the clinic with any problems, questions or concerns. We can certainly see the patient much sooner if necessary   No orders of the defined types were placed in this encounter.    Erynn Vaca L Sebastian Lurz, PA-C 10/31/19

## 2019-11-03 ENCOUNTER — Inpatient Hospital Stay: Payer: Medicaid Other

## 2019-11-03 ENCOUNTER — Telehealth: Payer: Self-pay | Admitting: *Deleted

## 2019-11-03 ENCOUNTER — Inpatient Hospital Stay: Payer: Medicaid Other | Admitting: Physician Assistant

## 2019-11-03 ENCOUNTER — Telehealth: Payer: Self-pay | Admitting: Physician Assistant

## 2019-11-03 ENCOUNTER — Inpatient Hospital Stay: Payer: Medicaid Other | Attending: Physician Assistant

## 2019-11-03 DIAGNOSIS — C8118 Nodular sclerosis classical Hodgkin lymphoma, lymph nodes of multiple sites: Secondary | ICD-10-CM | POA: Insufficient documentation

## 2019-11-03 DIAGNOSIS — Z5111 Encounter for antineoplastic chemotherapy: Secondary | ICD-10-CM | POA: Insufficient documentation

## 2019-11-03 NOTE — Telephone Encounter (Signed)
Patient no showed for lab/flush/md appt.  Was scheduled also for infusion.    Attempted to reach patient, no answer and no voicemail set up to leave message.

## 2019-11-03 NOTE — Telephone Encounter (Signed)
Scheduled appt per 6/1 sch message - spoke with pt and he is aware of appt date and time.

## 2019-11-03 NOTE — Progress Notes (Deleted)
[place] OFFICE PROGRESS NOTE  Patient, No Pcp Per No address on file  DIAGNOSIS: Classical Hodgkin Lymphoma, Nodular Sclerosis subtype. He presented with bulky bilateral cervical lymphadenopathy, mediastinal lymphadenopathy, and mildly enlarged retroperitoneal periaortic adenopathy and iliac adenopathy. He also presented with involvement of the spleen. He was diagnosed in December 2020.  PRIOR THERAPY: None  CURRENT THERAPY: Systemic chemotherapy with doxorubicin, dacarbazine, vinblastine, and bleomycin on days 1 and 15 every 4 weeks. First dose expected on January 7th, 2021.Statuspostday 15 cycle5.Starting from cycle #4, bleomycin will be dropped.  INTERVAL HISTORY: Terry Quinn 19 y.o. male returns to the clinic for a follow up appointment. The patient is feeling well today without any concerning complaints except for __. Otherwise, the patient continues to tolerate treatment with chemotherapywell without any adverse sideeffects. Denies any chills, night sweats, or weight loss. Denies any chest pain or hemoptysis. Denies any nausea, vomiting, diarrhea, or constipation. Denies any headache or visual changes. He denies any peripheral neuropathy. He denies any abnormal bleeding or bruising including melena, hematochezia, hematuria, epistaxis, or gingival bleeding.The patient is here today for evaluation prior to starting cycle # day 1 cycle5  MEDICAL HISTORY:No past medical history on file.  ALLERGIES:  has No Known Allergies.  MEDICATIONS:  Current Outpatient Medications  Medication Sig Dispense Refill  . allopurinol (ZYLOPRIM) 100 MG tablet Take 1 tablet (100 mg total) by mouth 2 (two) times daily. 60 tablet 2  . cetirizine (ZYRTEC) 10 MG tablet Take 10 mg by mouth daily.    Marland Kitchen lidocaine-prilocaine (EMLA) cream Apply 1 application topically as needed. 30 g 1  . prochlorperazine (COMPAZINE) 10 MG tablet Take 1 tablet (10 mg total) by mouth every 6 (six)  hours as needed for nausea or vomiting. (Patient not taking: Reported on 09/21/2019) 30 tablet 0   No current facility-administered medications for this visit.    SURGICAL HISTORY:  Past Surgical History:  Procedure Laterality Date  . IR IMAGING GUIDED PORT INSERTION  06/30/2019  . IR US GUIDE BX ASP/DRAIN  05/27/2019    REVIEW OF SYSTEMS:   Review of Systems  Constitutional: Negative for appetite change, chills, fatigue, fever and unexpected weight change.  HENT:   Negative for mouth sores, nosebleeds, sore throat and trouble swallowing.   Eyes: Negative for eye problems and icterus.  Respiratory: Negative for cough, hemoptysis, shortness of breath and wheezing.   Cardiovascular: Negative for chest pain and leg swelling.  Gastrointestinal: Negative for abdominal pain, constipation, diarrhea, nausea and vomiting.  Genitourinary: Negative for bladder incontinence, difficulty urinating, dysuria, frequency and hematuria.   Musculoskeletal: Negative for back pain, gait problem, neck pain and neck stiffness.  Skin: Negative for itching and rash.  Neurological: Negative for dizziness, extremity weakness, gait problem, headaches, light-headedness and seizures.  Hematological: Negative for adenopathy. Does not bruise/bleed easily.  Psychiatric/Behavioral: Negative for confusion, depression and sleep disturbance. The patient is not nervous/anxious.     PHYSICAL EXAMINATION:  There were no vitals taken for this visit.  ECOG PERFORMANCE STATUS: {CHL ONC ECOG [id number]  Physical Exam  Constitutional: Oriented to person, place, and time and well-developed, well-nourished, and in no distress. No distress.  HENT:  Head: Normocephalic and atraumatic.  Mouth/Throat: Oropharynx is clear and moist. No oropharyngeal exudate.  Eyes: Conjunctivae are normal. Right eye exhibits no discharge. Left eye exhibits no discharge. No scleral icterus.  Neck: Normal range of motion. Neck supple.   Cardiovascular: Normal rate, regular rhythm, normal heart sounds and intact distal pulses.  Pulmonary/Chest: Effort normal and breath sounds normal. No respiratory distress. No wheezes. No rales.  Abdominal: Soft. Bowel sounds are normal. Exhibits no distension and no mass. There is no tenderness.  Musculoskeletal: Normal range of motion. Exhibits no edema.  Lymphadenopathy:    No cervical adenopathy.  Neurological: Alert and oriented to person, place, and time. Exhibits normal muscle tone. Gait normal. Coordination normal.  Skin: Skin is warm and dry. No rash noted. Not diaphoretic. No erythema. No pallor.  Psychiatric: Mood, memory and judgment normal.  Vitals reviewed.  LABORATORY DATA: Lab Results  Component Value Date   WBC 6.9 10/21/2019   HGB 13.2 10/21/2019   HCT 39.2 10/21/2019   MCV 85.4 10/21/2019   PLT 161 10/21/2019      Chemistry      Component Value Date/Time   NA 139 10/21/2019 1040   K 4.2 10/21/2019 1040   CL 102 10/21/2019 1040   CO2 27 10/21/2019 1040   BUN 10 10/21/2019 1040   CREATININE 0.79 10/21/2019 1040      Component Value Date/Time   CALCIUM 9.3 10/21/2019 1040   ALKPHOS 63 10/21/2019 1040   AST 21 10/21/2019 1040   ALT 32 10/21/2019 1040   BILITOT 0.2 (L) 10/21/2019 1040       RADIOGRAPHIC STUDIES:  No results found.   ASSESSMENT/PLAN:  This is a very pleasant 19 year old male diagnosed with classical Hodgkin's lymphoma, nodular sclerosing subtype. He presented with bulky bilateral cervical lymphadenopathy, mediastinal lymphadenopathy, and mildly enlarged retroperitoneal,periaortic, and iliac adenopathy. He also presents with spleen involvement. He was diagnosed in December 2020.  The patient is currently undergoing systemic chemotherapy with ABVD(doxorubicin, dacarbazine, vinblastine, and bleomycin)on days 1 and 15 every 4 weeks. He is status post day 15 cycle5. He is tolerating treatment well except formild nausea with cycle  #1 which is controlled with his anti-emetic.Starting from cycle #4, bleomycin has been dropped.  The patient was seen with Dr. Julien Nordmann today.  Labs were reviewed.  Recommend he proceed with day 1 of cycle 6 today as scheduled.  We will see him back for follow-up visit in 2 weeks for evaluation before starting day 15 of cycle #6.  The patient was advised to call immediately if he has any concerning symptoms in the interval. The patient voices understanding of current disease status and treatment options and is in agreement with the current care plan. All questions were answered. The patient knows to call the clinic with any problems, questions or concerns. We can certainly see the patient much sooner if necessary        No orders of the defined types were placed in this encounter.    Nonie Lochner L Langston [place], PA-C 11/03/19

## 2019-11-04 ENCOUNTER — Inpatient Hospital Stay: Payer: Medicaid Other

## 2019-11-04 ENCOUNTER — Telehealth: Payer: Self-pay | Admitting: Physician Assistant

## 2019-11-04 ENCOUNTER — Inpatient Hospital Stay: Payer: Medicaid Other | Admitting: Physician Assistant

## 2019-11-04 NOTE — Telephone Encounter (Signed)
Tried calling the patient about his appointment for today. It does not appear that he has arrived to his 8:45 lab appointment yet. I called twice and was unable to reach him and unable to leave a voicemail.

## 2019-11-09 ENCOUNTER — Inpatient Hospital Stay: Payer: Medicaid Other

## 2019-11-09 ENCOUNTER — Other Ambulatory Visit: Payer: Self-pay

## 2019-11-09 ENCOUNTER — Inpatient Hospital Stay (HOSPITAL_BASED_OUTPATIENT_CLINIC_OR_DEPARTMENT_OTHER): Payer: Medicaid Other | Admitting: Internal Medicine

## 2019-11-09 ENCOUNTER — Encounter: Payer: Self-pay | Admitting: Internal Medicine

## 2019-11-09 VITALS — BP 122/75 | HR 82 | Temp 97.9°F | Resp 18 | Ht 72.0 in | Wt 265.3 lb

## 2019-11-09 DIAGNOSIS — C8111 Nodular sclerosis classical Hodgkin lymphoma, lymph nodes of head, face, and neck: Secondary | ICD-10-CM

## 2019-11-09 DIAGNOSIS — C8118 Nodular sclerosis classical Hodgkin lymphoma, lymph nodes of multiple sites: Secondary | ICD-10-CM | POA: Diagnosis present

## 2019-11-09 DIAGNOSIS — Z5111 Encounter for antineoplastic chemotherapy: Secondary | ICD-10-CM

## 2019-11-09 DIAGNOSIS — Z95828 Presence of other vascular implants and grafts: Secondary | ICD-10-CM

## 2019-11-09 LAB — CBC WITH DIFFERENTIAL (CANCER CENTER ONLY)
Abs Immature Granulocytes: 0.16 10*3/uL — ABNORMAL HIGH (ref 0.00–0.07)
Basophils Absolute: 0.1 10*3/uL (ref 0.0–0.1)
Basophils Relative: 1 %
Eosinophils Absolute: 0.1 10*3/uL (ref 0.0–0.5)
Eosinophils Relative: 2 %
HCT: 37.6 % — ABNORMAL LOW (ref 39.0–52.0)
Hemoglobin: 12.8 g/dL — ABNORMAL LOW (ref 13.0–17.0)
Immature Granulocytes: 2 %
Lymphocytes Relative: 29 %
Lymphs Abs: 2.1 10*3/uL (ref 0.7–4.0)
MCH: 29.6 pg (ref 26.0–34.0)
MCHC: 34 g/dL (ref 30.0–36.0)
MCV: 87 fL (ref 80.0–100.0)
Monocytes Absolute: 0.6 10*3/uL (ref 0.1–1.0)
Monocytes Relative: 8 %
Neutro Abs: 4.2 10*3/uL (ref 1.7–7.7)
Neutrophils Relative %: 58 %
Platelet Count: 208 10*3/uL (ref 150–400)
RBC: 4.32 MIL/uL (ref 4.22–5.81)
RDW: 15.6 % — ABNORMAL HIGH (ref 11.5–15.5)
WBC Count: 7.3 10*3/uL (ref 4.0–10.5)
nRBC: 0 % (ref 0.0–0.2)

## 2019-11-09 LAB — CMP (CANCER CENTER ONLY)
ALT: 56 U/L — ABNORMAL HIGH (ref 0–44)
AST: 34 U/L (ref 15–41)
Albumin: 4.2 g/dL (ref 3.5–5.0)
Alkaline Phosphatase: 69 U/L (ref 38–126)
Anion gap: 11 (ref 5–15)
BUN: 12 mg/dL (ref 6–20)
CO2: 27 mmol/L (ref 22–32)
Calcium: 9.5 mg/dL (ref 8.9–10.3)
Chloride: 102 mmol/L (ref 98–111)
Creatinine: 0.86 mg/dL (ref 0.61–1.24)
GFR, Est AFR Am: >60 mL/min (ref >60)
GFR, Estimated: >60 mL/min (ref >60)
Glucose, Bld: 82 mg/dL (ref 70–99)
Potassium: 4.3 mmol/L (ref 3.5–5.1)
Sodium: 140 mmol/L (ref 135–145)
Total Bilirubin: 0.3 mg/dL (ref 0.3–1.2)
Total Protein: 7 g/dL (ref 6.5–8.1)

## 2019-11-09 MED ORDER — SODIUM CHLORIDE 0.9 % IV SOLN
150.0000 mg | Freq: Once | INTRAVENOUS | Status: AC
  Administered 2019-11-09: 150 mg via INTRAVENOUS
  Filled 2019-11-09: qty 150

## 2019-11-09 MED ORDER — PALONOSETRON HCL INJECTION 0.25 MG/5ML
0.2500 mg | Freq: Once | INTRAVENOUS | Status: AC
  Administered 2019-11-09: 0.25 mg via INTRAVENOUS

## 2019-11-09 MED ORDER — SODIUM CHLORIDE 0.9 % IV SOLN: 375.0000 mg/m2 | Freq: Once | INTRAVENOUS | Status: DC

## 2019-11-09 MED ORDER — VINBLASTINE SULFATE CHEMO INJECTION 1 MG/ML
6.3000 mg/m2 | Freq: Once | INTRAVENOUS | Status: AC
  Administered 2019-11-09: 14 mg via INTRAVENOUS
  Filled 2019-11-09: qty 14

## 2019-11-09 MED ORDER — HEPARIN SOD (PORK) LOCK FLUSH 100 UNIT/ML IV SOLN
500.0000 [IU] | Freq: Once | INTRAVENOUS | Status: AC | PRN
  Administered 2019-11-09: 500 [IU]
  Filled 2019-11-09: qty 5

## 2019-11-09 MED ORDER — SODIUM CHLORIDE 0.9% FLUSH
10.0000 mL | INTRAVENOUS | Status: DC | PRN
  Administered 2019-11-09: 10 mL via INTRAVENOUS
  Filled 2019-11-09: qty 10

## 2019-11-09 MED ORDER — SODIUM CHLORIDE 0.9 % IV SOLN
375.0000 mg/m2 | Freq: Once | INTRAVENOUS | Status: AC
  Administered 2019-11-09: 930 mg via INTRAVENOUS
  Filled 2019-11-09: qty 93

## 2019-11-09 MED ORDER — SODIUM CHLORIDE 0.9% FLUSH
10.0000 mL | INTRAVENOUS | Status: DC | PRN
  Administered 2019-11-09: 10 mL
  Filled 2019-11-09: qty 10

## 2019-11-09 MED ORDER — SODIUM CHLORIDE 0.9 % IV SOLN
Freq: Once | INTRAVENOUS | Status: AC
  Filled 2019-11-09: qty 250

## 2019-11-09 MED ORDER — SODIUM CHLORIDE 0.9 % IV SOLN
10.0000 mg | Freq: Once | INTRAVENOUS | Status: AC
  Administered 2019-11-09: 10 mg via INTRAVENOUS
  Filled 2019-11-09: qty 10

## 2019-11-09 MED ORDER — PALONOSETRON HCL INJECTION 0.25 MG/5ML
INTRAVENOUS | Status: AC
  Filled 2019-11-09: qty 5

## 2019-11-09 MED ORDER — DOXORUBICIN HCL CHEMO IV INJECTION 2 MG/ML
25.0000 mg/m2 | Freq: Once | INTRAVENOUS | Status: AC
  Administered 2019-11-09: 56 mg via INTRAVENOUS
  Filled 2019-11-09: qty 28

## 2019-11-09 NOTE — Patient Instructions (Signed)

## 2019-11-09 NOTE — Progress Notes (Signed)
[employer] Telephone:([phone]   Fax:(336) [phone]  OFFICE PROGRESS NOTE  Patient, No Pcp Per No address on file  DIAGNOSIS: Classical Hodgkin Lymphoma, Nodular Sclerosis subtype. He presented with bulky bilateral cervical lymphadenopathy, mediastinal lymphadenopathy, and mildly enlarged retroperitoneal periaortic adenopathy and iliac adenopathy. He also presented with involvement of the spleen. He was diagnosed in December 2020.   PRIOR THERAPY: None   CURRENT THERAPY: Systemic chemotherapy with doxorubicin, dacarbazine, vinblastine, and bleomycin on days 1 and 15 every 4 weeks.  First dose expected on January 7th, 2021.  Status post 5 cycles.  Bleomycin was discontinued after cycle #3.  INTERVAL HISTORY: Terry Quinn 19 y.o. male returns to the clinic today for follow-up visit.  The patient is feeling fine today with no concerning complaints.  He denied having any current chest pain, shortness of breath, cough or hemoptysis.  He denied having any recent weight loss or night sweats.  He has no nausea, vomiting, diarrhea or constipation.  He denied having any headache or visual changes.  He has been delaying the start of cycle #6 because of different social issues but finally made it to the clinic today before resuming his treatment.  MEDICAL HISTORY:No past medical history on file.  ALLERGIES:  has No Known Allergies.  MEDICATIONS:  Current Outpatient Medications  Medication Sig Dispense Refill  . allopurinol (ZYLOPRIM) 100 MG tablet Take 1 tablet (100 mg total) by mouth 2 (two) times daily. 60 tablet 2  . cetirizine (ZYRTEC) 10 MG tablet Take 10 mg by mouth daily.    Marland Kitchen lidocaine-prilocaine (EMLA) cream Apply 1 application topically as needed. 30 g 1  . prochlorperazine (COMPAZINE) 10 MG tablet Take 1 tablet (10 mg total) by mouth every 6 (six) hours as needed for nausea or vomiting. (Patient not taking: Reported on 09/21/2019) 30 tablet 0   No current  facility-administered medications for this visit.    SURGICAL HISTORY:  Past Surgical History:  Procedure Laterality Date  . IR IMAGING GUIDED PORT INSERTION  06/30/2019  . IR US GUIDE BX ASP/DRAIN  05/27/2019    REVIEW OF SYSTEMS:  A comprehensive review of systems was negative.   PHYSICAL EXAMINATION: General appearance: alert, cooperative and no distress Head: Normocephalic, without obvious abnormality, atraumatic Neck: no carotid bruit, no JVD and supple, symmetrical, trachea midline Lymph nodes: Cervical, supraclavicular, and axillary nodes normal. Resp: clear to auscultation bilaterally Back: symmetric, no curvature. ROM normal. No CVA tenderness. Cardio: regular rate and rhythm, S1, S2 normal, no murmur, click, rub or gallop GI: soft, non-tender; bowel sounds normal; no masses,  no organomegaly Extremities: extremities normal, atraumatic, no cyanosis or edema  ECOG PERFORMANCE STATUS: 1 - Symptomatic but completely ambulatory  Blood pressure 122/75, pulse 82, temperature 97.9 F (36.6 C), temperature source Temporal, resp. rate 18, height 6' (1.829 m), weight 265 lb 4.8 oz (120.3 kg), SpO2 99 %.  LABORATORY DATA: Lab Results  Component Value Date   WBC 7.3 11/09/2019   HGB 12.8 (L) 11/09/2019   HCT 37.6 (L) 11/09/2019   MCV 87.0 11/09/2019   PLT 208 11/09/2019      Chemistry      Component Value Date/Time   NA 139 10/21/2019 1040   K 4.2 10/21/2019 1040   CL 102 10/21/2019 1040   CO2 27 10/21/2019 1040   BUN 10 10/21/2019 1040   CREATININE 0.79 10/21/2019 1040      Component Value Date/Time   CALCIUM 9.3 10/21/2019 1040  ALKPHOS 63 10/21/2019 1040   AST 21 10/21/2019 1040   ALT 32 10/21/2019 1040   BILITOT 0.2 (L) 10/21/2019 1040       RADIOGRAPHIC STUDIES: No results found.  ASSESSMENT AND PLAN:  This is a very pleasant 20 year old male diagnosed with Classical Hodgkin Lymphoma, Nodular Sclerosis subtype. He presented with bulky bilateral cervical  lymphadenopathy, mediastinal lymphadenopathy, and mildly enlarged retroperitoneal periaortic adenopathy and iliac adenopathy. He also presented with involvement of the spleen. He was diagnosed in December 2020.  The patient is currently undergoing treatment with ABVD status post 5 cycles.  He noticed significant improvement in his symptoms after starting the chemotherapy. Repeat PET scan after cycle #2 showed marked improvement in the previously demonstrated hypermetabolic activity in the neck, chest, abdomen and pelvis with Deauville 2. The patient has been tolerating his treatment fairly well with no concerning complaints. Starting from cycle #4 we will discontinue his treatment with bleomycin and the patient will continue the other component of his treatment with AVD every 2 weeks.   I recommended for the patient to proceed with day 1 of cycle #6 today as planned. He will come back for follow-up visit in 2 weeks for evaluation before starting day 15 of cycle #6. I strongly advised the patient and recommend for him to proceed with the last cycle of his treatment as planned and not to delay or skip treatment as he did in the past. The patient was advised to call immediately if he has any concerning symptoms in the interval. The patient voices understanding of current disease status and treatment options and is in agreement with the current care plan.  All questions were answered. The patient knows to call the clinic with any problems, questions or concerns. We can certainly see the patient much sooner if necessary.  Disclaimer: This note was dictated with voice recognition software. Similar sounding words can inadvertently be transcribed and may not be corrected upon review.

## 2019-11-09 NOTE — Progress Notes (Signed)
Spoke w/ Dr. Julien Nordmann, increase dacarbazine dose for weight gain/BSA today.   Demetrius Charity, PharmD, BCPS, [facility] Pharmacist Pharmacy Phone: [phone] 11/09/2019

## 2019-11-11 ENCOUNTER — Telehealth: Payer: Self-pay | Admitting: Internal Medicine

## 2019-11-11 NOTE — Telephone Encounter (Signed)
Called pt -  No answer and no vmail - called mother and left message with appt date and time per 6/7 los -

## 2019-11-17 ENCOUNTER — Ambulatory Visit: Payer: Medicaid Other | Admitting: Physician Assistant

## 2019-11-17 ENCOUNTER — Other Ambulatory Visit: Payer: Medicaid Other

## 2019-11-17 ENCOUNTER — Ambulatory Visit: Payer: Medicaid Other

## 2019-11-20 MED FILL — Fosaprepitant Dimeglumine For IV Infusion 150 MG (Base Eq): INTRAVENOUS | Qty: 5 | Status: AC

## 2019-11-20 MED FILL — Dexamethasone Sodium Phosphate Inj 100 MG/10ML: INTRAMUSCULAR | Qty: 1 | Status: AC

## 2019-11-23 ENCOUNTER — Telehealth: Payer: Self-pay | Admitting: Internal Medicine

## 2019-11-23 ENCOUNTER — Inpatient Hospital Stay: Payer: Medicaid Other

## 2019-11-23 ENCOUNTER — Telehealth: Payer: Self-pay | Admitting: Medical Oncology

## 2019-11-23 ENCOUNTER — Ambulatory Visit: Payer: Medicaid Other

## 2019-11-23 ENCOUNTER — Inpatient Hospital Stay: Payer: Medicaid Other | Admitting: Internal Medicine

## 2019-11-23 NOTE — Telephone Encounter (Signed)
He thought his apt were at 1100. Per [place] pt r/s.

## 2019-11-23 NOTE — Telephone Encounter (Signed)
Called Mr. Campos in regards to his appointments that were missed this morning. No voice mail was set up for a message to be left.   Georgina Snell

## 2019-11-24 ENCOUNTER — Telehealth: Payer: Self-pay | Admitting: Internal Medicine

## 2019-11-24 NOTE — Telephone Encounter (Signed)
R/s apt per 6/21 sch message - unable to reach pt. Messaged RN

## 2019-11-25 ENCOUNTER — Telehealth: Payer: Self-pay | Admitting: Physician Assistant

## 2019-11-25 NOTE — Telephone Encounter (Signed)
I personally spoke to the patient and instructed him that his next appointment is Monday 6/28. I advised him to arrive around 9 AM. He expressed understanding with the time and was able to repeat this back to me.

## 2019-11-30 ENCOUNTER — Inpatient Hospital Stay: Payer: Medicaid Other

## 2019-11-30 ENCOUNTER — Inpatient Hospital Stay (HOSPITAL_BASED_OUTPATIENT_CLINIC_OR_DEPARTMENT_OTHER): Payer: Medicaid Other | Admitting: Internal Medicine

## 2019-11-30 ENCOUNTER — Other Ambulatory Visit: Payer: Self-pay

## 2019-11-30 ENCOUNTER — Encounter: Payer: Self-pay | Admitting: Internal Medicine

## 2019-11-30 VITALS — BP 106/94 | HR 64 | Temp 97.6°F | Resp 18 | Ht 72.0 in | Wt 266.6 lb

## 2019-11-30 DIAGNOSIS — Z5111 Encounter for antineoplastic chemotherapy: Secondary | ICD-10-CM | POA: Diagnosis not present

## 2019-11-30 DIAGNOSIS — Z95828 Presence of other vascular implants and grafts: Secondary | ICD-10-CM

## 2019-11-30 DIAGNOSIS — C8111 Nodular sclerosis classical Hodgkin lymphoma, lymph nodes of head, face, and neck: Secondary | ICD-10-CM | POA: Diagnosis not present

## 2019-11-30 LAB — CMP (CANCER CENTER ONLY)
ALT: 27 U/L (ref 0–44)
AST: 16 U/L (ref 15–41)
Albumin: 3.8 g/dL (ref 3.5–5.0)
Alkaline Phosphatase: 66 U/L (ref 38–126)
Anion gap: 9 (ref 5–15)
BUN: 13 mg/dL (ref 6–20)
CO2: 26 mmol/L (ref 22–32)
Calcium: 9 mg/dL (ref 8.9–10.3)
Chloride: 105 mmol/L (ref 98–111)
Creatinine: 0.8 mg/dL (ref 0.61–1.24)
GFR, Est AFR Am: >60 mL/min (ref >60)
GFR, Estimated: >60 mL/min (ref >60)
Glucose, Bld: 101 mg/dL — ABNORMAL HIGH (ref 70–99)
Potassium: 3.7 mmol/L (ref 3.5–5.1)
Sodium: 140 mmol/L (ref 135–145)
Total Bilirubin: <0.2 mg/dL — ABNORMAL LOW (ref 0.3–1.2)
Total Protein: 6.5 g/dL (ref 6.5–8.1)

## 2019-11-30 LAB — CBC WITH DIFFERENTIAL (CANCER CENTER ONLY)
Abs Immature Granulocytes: 0.24 10*3/uL — ABNORMAL HIGH (ref 0.00–0.07)
Basophils Absolute: 0.1 10*3/uL (ref 0.0–0.1)
Basophils Relative: 1 %
Eosinophils Absolute: 0.3 10*3/uL (ref 0.0–0.5)
Eosinophils Relative: 3 %
HCT: 36.2 % — ABNORMAL LOW (ref 39.0–52.0)
Hemoglobin: 12.2 g/dL — ABNORMAL LOW (ref 13.0–17.0)
Immature Granulocytes: 2 %
Lymphocytes Relative: 24 %
Lymphs Abs: 2.5 10*3/uL (ref 0.7–4.0)
MCH: 29.4 pg (ref 26.0–34.0)
MCHC: 33.7 g/dL (ref 30.0–36.0)
MCV: 87.2 fL (ref 80.0–100.0)
Monocytes Absolute: 0.8 10*3/uL (ref 0.1–1.0)
Monocytes Relative: 8 %
Neutro Abs: 6.5 10*3/uL (ref 1.7–7.7)
Neutrophils Relative %: 62 %
Platelet Count: 195 10*3/uL (ref 150–400)
RBC: 4.15 MIL/uL — ABNORMAL LOW (ref 4.22–5.81)
RDW: 14.6 % (ref 11.5–15.5)
WBC Count: 10.3 10*3/uL (ref 4.0–10.5)
nRBC: 0 % (ref 0.0–0.2)

## 2019-11-30 MED ORDER — VINBLASTINE SULFATE CHEMO INJECTION 1 MG/ML
6.3000 mg/m2 | Freq: Once | INTRAVENOUS | Status: AC
  Administered 2019-11-30: 14 mg via INTRAVENOUS
  Filled 2019-11-30: qty 14

## 2019-11-30 MED ORDER — SODIUM CHLORIDE 0.9 % IV SOLN
10.0000 mg | Freq: Once | INTRAVENOUS | Status: AC
  Administered 2019-11-30: 10 mg via INTRAVENOUS
  Filled 2019-11-30: qty 10

## 2019-11-30 MED ORDER — SODIUM CHLORIDE 0.9% FLUSH
10.0000 mL | INTRAVENOUS | Status: DC | PRN
  Filled 2019-11-30: qty 10

## 2019-11-30 MED ORDER — DOXORUBICIN HCL CHEMO IV INJECTION 2 MG/ML
25.0000 mg/m2 | Freq: Once | INTRAVENOUS | Status: AC
  Administered 2019-11-30: 56 mg via INTRAVENOUS
  Filled 2019-11-30: qty 28

## 2019-11-30 MED ORDER — SODIUM CHLORIDE 0.9 % IV SOLN
375.0000 mg/m2 | Freq: Once | INTRAVENOUS | Status: AC
  Administered 2019-11-30: 930 mg via INTRAVENOUS
  Filled 2019-11-30: qty 93

## 2019-11-30 MED ORDER — PALONOSETRON HCL INJECTION 0.25 MG/5ML
INTRAVENOUS | Status: AC
  Filled 2019-11-30: qty 5

## 2019-11-30 MED ORDER — SODIUM CHLORIDE 0.9 % IV SOLN
Freq: Once | INTRAVENOUS | Status: AC
  Filled 2019-11-30: qty 250

## 2019-11-30 MED ORDER — SODIUM CHLORIDE 0.9 % IV SOLN
150.0000 mg | Freq: Once | INTRAVENOUS | Status: AC
  Administered 2019-11-30: 150 mg via INTRAVENOUS
  Filled 2019-11-30: qty 150

## 2019-11-30 MED ORDER — HEPARIN SOD (PORK) LOCK FLUSH 100 UNIT/ML IV SOLN
500.0000 [IU] | INTRAVENOUS | Status: DC | PRN
  Filled 2019-11-30: qty 5

## 2019-11-30 MED ORDER — HEPARIN SOD (PORK) LOCK FLUSH 100 UNIT/ML IV SOLN
500.0000 [IU] | Freq: Once | INTRAVENOUS | Status: AC | PRN
  Administered 2019-11-30: 500 [IU]
  Filled 2019-11-30: qty 5

## 2019-11-30 MED ORDER — PALONOSETRON HCL INJECTION 0.25 MG/5ML
0.2500 mg | Freq: Once | INTRAVENOUS | Status: AC
  Administered 2019-11-30: 0.25 mg via INTRAVENOUS

## 2019-11-30 MED ORDER — SODIUM CHLORIDE 0.9% FLUSH
10.0000 mL | INTRAVENOUS | Status: DC | PRN
  Administered 2019-11-30: 10 mL
  Filled 2019-11-30: qty 10

## 2019-11-30 NOTE — Progress Notes (Signed)
Blood return noted before, during, and after Adriamycin administration.

## 2019-11-30 NOTE — Patient Instructions (Signed)
Terry Quinn Discharge Instructions for Patients Receiving Chemotherapy  Today you received the following chemotherapy agents: Adriamycin, Vinblastine, and Dacarbazine.  To help prevent nausea and vomiting after your treatment, we encourage you to take your nausea medication as directed.   If you develop nausea and vomiting that is not controlled by your nausea medication, call the clinic.   BELOW ARE SYMPTOMS THAT SHOULD BE REPORTED IMMEDIATELY:  *FEVER GREATER THAN 100.5 F  *CHILLS WITH OR WITHOUT FEVER  NAUSEA AND VOMITING THAT IS NOT CONTROLLED WITH YOUR NAUSEA MEDICATION  *UNUSUAL SHORTNESS OF BREATH  *UNUSUAL BRUISING OR BLEEDING  TENDERNESS IN MOUTH AND THROAT WITH OR WITHOUT PRESENCE OF ULCERS  *URINARY PROBLEMS  *BOWEL PROBLEMS  UNUSUAL RASH Items with * indicate a potential emergency and should be followed up as soon as possible.  Feel free to call the clinic should you have any questions or concerns. The clinic phone number is [phone].  Please show the [place] at check-in to the Emergency Department and triage nurse.

## 2019-11-30 NOTE — Progress Notes (Signed)
Terry Quinn:(336) [phone]   Fax:(336) [phone]  OFFICE PROGRESS NOTE  Patient, No Pcp Per No address on file  DIAGNOSIS: Classical Hodgkin Lymphoma, Nodular Sclerosis subtype. He presented with bulky bilateral cervical lymphadenopathy, mediastinal lymphadenopathy, and mildly enlarged retroperitoneal periaortic adenopathy and iliac adenopathy. He also presented with involvement of the spleen. He was diagnosed in December 2020.   PRIOR THERAPY: None   CURRENT THERAPY: Systemic chemotherapy with doxorubicin, dacarbazine, vinblastine, and bleomycin on days 1 and 15 every 4 weeks.  First dose expected on January 7th, 2021.  Status post 5 cycles.  Bleomycin was discontinued after cycle #3.  INTERVAL HISTORY: Terry Quinn 19 y.o. male returns to the clinic today for follow-up visit.  The patient is feeling fine today with no concerning complaints.  He tolerated the last dose of his treatment fairly well.  He denied having any chest pain, shortness of breath, cough or hemoptysis.  He denied having any fever or chills.  He has no nausea, vomiting, diarrhea or constipation.  He has no headache or visual changes.  He is here today for evaluation before starting day 15 of cycle #6.  MEDICAL HISTORY:No past medical history on file.  ALLERGIES:  has No Known Allergies.  MEDICATIONS:  Current Outpatient Medications  Medication Sig Dispense Refill   allopurinol (ZYLOPRIM) 100 MG tablet Take 1 tablet (100 mg total) by mouth 2 (two) times daily. 60 tablet 2   cetirizine (ZYRTEC) 10 MG tablet Take 10 mg by mouth daily.     lidocaine-prilocaine (EMLA) cream Apply 1 application topically as needed. 30 g 1   prochlorperazine (COMPAZINE) 10 MG tablet Take 1 tablet (10 mg total) by mouth every 6 (six) hours as needed for nausea or vomiting. (Patient not taking: Reported on 09/21/2019) 30 tablet 0   No current facility-administered medications for this visit.     SURGICAL HISTORY:  Past Surgical History:  Procedure Laterality Date   IR IMAGING GUIDED PORT INSERTION  06/30/2019   IR US GUIDE BX ASP/DRAIN  05/27/2019    REVIEW OF SYSTEMS:  A comprehensive review of systems was negative.   PHYSICAL EXAMINATION: General appearance: alert, cooperative and no distress Head: Normocephalic, without obvious abnormality, atraumatic Neck: no carotid bruit, no JVD and supple, symmetrical, trachea midline Lymph nodes: Cervical, supraclavicular, and axillary nodes normal. Resp: clear to auscultation bilaterally Back: symmetric, no curvature. ROM normal. No CVA tenderness. Cardio: regular rate and rhythm, S1, S2 normal, no murmur, click, rub or gallop GI: soft, non-tender; bowel sounds normal; no masses,  no organomegaly Extremities: extremities normal, atraumatic, no cyanosis or edema  ECOG PERFORMANCE STATUS: 1 - Symptomatic but completely ambulatory  Blood pressure (!) 106/94, pulse 64, temperature 97.6 F (36.4 C), temperature source Temporal, resp. rate 18, height 6' (1.829 m), weight 266 lb 9.6 oz (120.9 kg), SpO2 100 %.  LABORATORY DATA: Lab Results  Component Value Date   WBC 10.3 11/30/2019   HGB 12.2 (L) 11/30/2019   HCT 36.2 (L) 11/30/2019   MCV 87.2 11/30/2019   PLT 195 11/30/2019      Chemistry      Component Value Date/Time   NA 140 11/09/2019 1150   K 4.3 11/09/2019 1150   CL 102 11/09/2019 1150   CO2 27 11/09/2019 1150   BUN 12 11/09/2019 1150   CREATININE 0.86 11/09/2019 1150      Component Value Date/Time   CALCIUM 9.5 11/09/2019 1150   ALKPHOS 69 11/09/2019 1150  AST 34 11/09/2019 1150   ALT 56 (H) 11/09/2019 1150   BILITOT 0.3 11/09/2019 1150       RADIOGRAPHIC STUDIES: No results found.  ASSESSMENT AND PLAN:  This is a very pleasant 19 year old male diagnosed with Classical Hodgkin Lymphoma, Nodular Sclerosis subtype. He presented with bulky bilateral cervical lymphadenopathy, mediastinal  lymphadenopathy, and mildly enlarged retroperitoneal periaortic adenopathy and iliac adenopathy. He also presented with involvement of the spleen. He was diagnosed in December 2020.  The patient is currently undergoing treatment with ABVD status post 5 cycles.  He noticed significant improvement in his symptoms after starting the chemotherapy. Repeat PET scan after cycle #2 showed marked improvement in the previously demonstrated hypermetabolic activity in the neck, chest, abdomen and pelvis with Deauville 2. Is here today to start day 15 of cycle #6.  The patient is feeling fine with no concerning issues. I recommended for him to proceed with the treatment today as planned. I will see him back for follow-up visit in 3 weeks for evaluation with repeat PET scan for restaging of his disease after cycle #6. The patient was advised to call immediately if he has any concerning symptoms in the interval. The patient voices understanding of current disease status and treatment options and is in agreement with the current care plan.  All questions were answered. The patient knows to call the clinic with any problems, questions or concerns. We can certainly see the patient much sooner if necessary.  Disclaimer: This note was dictated with voice recognition software. Similar sounding words can inadvertently be transcribed and may not be corrected upon review.

## 2019-12-02 ENCOUNTER — Telehealth: Payer: Self-pay | Admitting: Internal Medicine

## 2019-12-02 NOTE — Telephone Encounter (Signed)
Scheduled per los called and left msg. Mailed printout  

## 2019-12-03 DIAGNOSIS — Z419 Encounter for procedure for purposes other than remedying health state, unspecified: Secondary | ICD-10-CM | POA: Diagnosis not present

## 2019-12-08 ENCOUNTER — Inpatient Hospital Stay: Payer: Medicaid Other

## 2019-12-08 ENCOUNTER — Inpatient Hospital Stay: Payer: Medicaid Other | Admitting: Internal Medicine

## 2019-12-18 ENCOUNTER — Inpatient Hospital Stay: Payer: Medicaid Other

## 2019-12-21 ENCOUNTER — Inpatient Hospital Stay: Payer: Medicaid Other | Admitting: Internal Medicine

## 2019-12-21 ENCOUNTER — Telehealth: Payer: Self-pay | Admitting: *Deleted

## 2019-12-21 ENCOUNTER — Telehealth: Payer: Self-pay | Admitting: Medical Oncology

## 2019-12-21 NOTE — Telephone Encounter (Signed)
"   My lymph nodes in my neck are swelling." PET scan 7/26.

## 2019-12-21 NOTE — Telephone Encounter (Signed)
PET Scan Authorized.  Called patient to ask him to  Schedule his PET scan.  Spoke directly with patient to get his scan scheduled.

## 2019-12-21 NOTE — Telephone Encounter (Signed)
We have to wait for the results of the PET scan to figure out what is going on with him.  Thank you.

## 2019-12-22 ENCOUNTER — Telehealth: Payer: Self-pay | Admitting: Physician Assistant

## 2019-12-22 NOTE — Telephone Encounter (Signed)
Pt.notified

## 2019-12-22 NOTE — Telephone Encounter (Signed)
Scheduled appt per 7/20 sch msg - unable to reach pt .left message for pts mother with apt date and time

## 2019-12-28 ENCOUNTER — Encounter (HOSPITAL_COMMUNITY): Payer: Medicaid Other

## 2019-12-29 ENCOUNTER — Telehealth: Payer: Self-pay | Admitting: Physician Assistant

## 2019-12-29 NOTE — Telephone Encounter (Signed)
I called the patient because he did not show up to his PET scan yesterday. He stated that his new work schedule has affected his ability to come to appointments. I had a lengthy discussion with the patient about the importance of his scan in assessing his treatment response. I instructed him to call radiology scheduling and to reschedule his PET scan at his earliest availability. He was given the number. I discussed that if he needs a work note to excuse him from work for his follow up appointment with us/scan to please let us know. I instructed him to call us back to reschedule his appointment with me or Dr. Julien Nordmann until after his PET scan. I will cancel his appointment for tomorrow. He expressed understanding of the instructions.

## 2019-12-30 ENCOUNTER — Inpatient Hospital Stay: Payer: Medicaid Other | Admitting: Physician Assistant

## 2020-01-03 DIAGNOSIS — Z419 Encounter for procedure for purposes other than remedying health state, unspecified: Secondary | ICD-10-CM | POA: Diagnosis not present

## 2020-02-03 DIAGNOSIS — Z419 Encounter for procedure for purposes other than remedying health state, unspecified: Secondary | ICD-10-CM | POA: Diagnosis not present

## 2020-03-04 DIAGNOSIS — Z419 Encounter for procedure for purposes other than remedying health state, unspecified: Secondary | ICD-10-CM | POA: Diagnosis not present

## 2020-03-31 ENCOUNTER — Ambulatory Visit: Payer: Medicaid Other | Attending: Family Medicine | Admitting: Licensed Clinical Social Worker

## 2020-03-31 ENCOUNTER — Encounter: Payer: Self-pay | Admitting: Physician Assistant

## 2020-03-31 ENCOUNTER — Ambulatory Visit (INDEPENDENT_AMBULATORY_CARE_PROVIDER_SITE_OTHER): Payer: Medicaid Other | Admitting: Physician Assistant

## 2020-03-31 ENCOUNTER — Other Ambulatory Visit: Payer: Self-pay

## 2020-03-31 VITALS — BP 132/74 | HR 84 | Temp 97.6°F | Resp 18 | Ht 72.0 in | Wt 261.0 lb

## 2020-03-31 DIAGNOSIS — F4323 Adjustment disorder with mixed anxiety and depressed mood: Secondary | ICD-10-CM

## 2020-03-31 DIAGNOSIS — K219 Gastro-esophageal reflux disease without esophagitis: Secondary | ICD-10-CM | POA: Diagnosis not present

## 2020-03-31 DIAGNOSIS — M5441 Lumbago with sciatica, right side: Secondary | ICD-10-CM | POA: Diagnosis not present

## 2020-03-31 MED ORDER — PANTOPRAZOLE SODIUM 40 MG PO TBEC: 40.0000 mg | DELAYED_RELEASE_TABLET | Freq: Every day | ORAL | 0 refills | Status: DC

## 2020-03-31 MED ORDER — IBUPROFEN 800 MG PO TABS: 800.0000 mg | ORAL_TABLET | Freq: Three times a day (TID) | ORAL | 0 refills | Status: DC | PRN

## 2020-03-31 MED ORDER — METHYLPREDNISOLONE SODIUM SUCC 125 MG IJ SOLR
125.0000 mg | Freq: Once | INTRAMUSCULAR | Status: AC
  Administered 2020-03-31: 125 mg via INTRAMUSCULAR

## 2020-03-31 NOTE — Progress Notes (Signed)
New Patient Office Visit  Subjective:  Patient ID: Terry Quinn, male    DOB: 2000/10/11  Age: 19 y.o. MRN: [id number]  CC:  Chief Complaint  Patient presents with  . Back Pain     SUBJECTIVE:  Terry Quinn is a 19 y.o. male who complains of an injury causing low back pain , was working long shifts at a saw mill, requiring heavy lifitng 4 month(s) ago. The pain is positional with bending or lifting, with radiation down the legs. Symptoms have been acute and constant since that time. Prior history of back problems: no prior back problems  in the past. There is not numbness in the legs.  Has been taking ibuprofen and using hot showers without much relief.  States that he is no longer at that job and not lifting heavy on a daily basis has started to offer some relief.  States history of GERD, states that he has been having heartburn and acid reflux since taking higher doses of ibuprofen   History reviewed. No pertinent past medical history.  Past Surgical History:  Procedure Laterality Date  . IR IMAGING GUIDED PORT INSERTION  06/30/2019  . IR US GUIDE BX ASP/DRAIN  05/27/2019    History reviewed. No pertinent family history.  Social History   Socioeconomic History  . Marital status: Single    Spouse name: Not on file  . Number of children: Not on file  . Years of education: Not on file  . Highest education level: Not on file  Occupational History  . Not on file  Tobacco Use  . Smoking status: Former Smoker    Quit date: 05/31/2019    Years since quitting: 0.8  . Smokeless tobacco: Never Used  Substance and Sexual Activity  . Alcohol use: No  . Drug use: No  . Sexual activity: Not on file  Other Topics Concern  . Not on file  Social History Narrative  . Not on file   Social Determinants of Health   Financial Resource Strain:   . Difficulty of Paying Living Expenses: Not on file  Food Insecurity:   . Worried About Charity fundraiser in the  Last Year: Not on file  . Ran Out of Food in the Last Year: Not on file  Transportation Needs:   . Lack of Transportation (Medical): Not on file  . Lack of Transportation (Non-Medical): Not on file  Physical Activity:   . Days of Exercise per Week: Not on file  . Minutes of Exercise per Session: Not on file  Stress:   . Feeling of Stress : Not on file  Social Connections:   . Frequency of Communication with Friends and Family: Not on file  . Frequency of Social Gatherings with Friends and Family: Not on file  . Attends Religious Services: Not on file  . Active Member of Clubs or Organizations: Not on file  . Attends Archivist Meetings: Not on file  . Marital Status: Not on file  Intimate Partner Violence:   . Fear of Current or Ex-Partner: Not on file  . Emotionally Abused: Not on file  . Physically Abused: Not on file  . Sexually Abused: Not on file    ROS Review of Systems  Constitutional: Negative.   HENT: Negative.   Eyes: Negative.   Respiratory: Negative.   Cardiovascular: Negative.   Gastrointestinal: Negative.   Endocrine: Negative.   Genitourinary: Negative for difficulty urinating, scrotal swelling and testicular pain.  Musculoskeletal: Positive for back pain.  Skin: Negative.   Allergic/Immunologic: Negative.   Neurological: Negative.   Hematological: Negative.   Psychiatric/Behavioral: Negative.     Objective:   Today's Vitals: BP 132/74 (BP Location: Left Arm, Patient Position: Sitting, Cuff Size: Large)   Pulse 84   Temp 97.6 F (36.4 C) (Oral)   Resp 18   Ht 6' (1.829 m)   Wt 261 lb (118.4 kg)   SpO2 97%   BMI 35.40 kg/m   Physical Exam Vitals and nursing note reviewed.  Constitutional:      General: He is not in acute distress.    Appearance: Normal appearance. He is not ill-appearing.  HENT:     Head: Normocephalic and atraumatic.     Right Ear: External ear normal.     Left Ear: External ear normal.     Nose: Nose normal.      Mouth/Throat:     Mouth: Mucous membranes are moist.     Pharynx: Oropharynx is clear.  Eyes:     Extraocular Movements: Extraocular movements intact.     Conjunctiva/sclera: Conjunctivae normal.     Pupils: Pupils are equal, round, and reactive to light.  Cardiovascular:     Rate and Rhythm: Normal rate and regular rhythm.     Pulses: Normal pulses.     Heart sounds: Normal heart sounds.  Pulmonary:     Effort: Pulmonary effort is normal.     Breath sounds: Normal breath sounds.  Abdominal:     General: Abdomen is flat.     Palpations: Abdomen is soft.  Musculoskeletal:     Cervical back: Normal range of motion and neck supple.  Skin:    General: Skin is warm and dry.  Neurological:     General: No focal deficit present.     Mental Status: He is alert and oriented to person, place, and time.  Psychiatric:        Mood and Affect: Mood normal.        Behavior: Behavior normal.        Thought Content: Thought content normal.        Judgment: Judgment normal.   Patient appears to be in mild to moderate pain, antalgic gait noted. Lumbosacral spine area reveals no local tenderness or mass. Painful and reduced LS ROM noted. Straight leg raise is positive at 40 degrees on right. DTR's, motor strength and sensation normal, including heel and toe gait.  Peripheral pulses are palpable. Lumbar spine X-Ray: not indicated.     Assessment & Plan:   Problem List Items Addressed This Visit      Digestive   Gastroesophageal reflux disease without esophagitis   Relevant Medications   pantoprazole (PROTONIX) 40 MG tablet     Nervous and Auditory   Acute right-sided low back pain with right-sided sciatica - Primary   Relevant Medications   ibuprofen (ADVIL) 800 MG tablet      Outpatient Encounter Medications as of 03/31/2020  Medication Sig  . allopurinol (ZYLOPRIM) 100 MG tablet Take 1 tablet (100 mg total) by mouth 2 (two) times daily.  . cetirizine (ZYRTEC) 10 MG tablet Take 10  mg by mouth daily.  Marland Kitchen ibuprofen (ADVIL) 800 MG tablet Take 1 tablet (800 mg total) by mouth every 8 (eight) hours as needed.  . lidocaine-prilocaine (EMLA) cream Apply 1 application topically as needed.  . pantoprazole (PROTONIX) 40 MG tablet Take 1 tablet (40 mg total) by mouth daily.  . prochlorperazine (COMPAZINE)  10 MG tablet Take 1 tablet (10 mg total) by mouth every 6 (six) hours as needed for nausea or vomiting. (Patient not taking: Reported on 09/21/2019)  . [EXPIRED] methylPREDNISolone sodium succinate (SOLU-MEDROL) 125 mg/2 mL injection 125 mg    No facility-administered encounter medications on file as of 03/31/2020.  ASSESSMENT:  1. Acute right-sided low back pain with right-sided sciatica  - methylPREDNISolone sodium succinate (SOLU-MEDROL) 125 mg/2 mL injection 125 mg  2. Gastroesophageal reflux disease without esophagitis Trial protonix    PLAN: For acute pain, rest, intermittent application of cold packs (later, may switch to heat, but do not sleep on heating pad), analgesics. Discussed longer term treatment plan of prn NSAID's and discussed a home back care exercise program with flexion exercise routine. Proper lifting with avoidance of heavy lifting discussed. Consider Physical Therapy and XRay studies if not improving. Call or return to clinic prn if these symptoms worsen or fail to improve as anticipated.   I have reviewed the patient's medical history (PMH, PSH, Social History, Family History, Medications, and allergies) , and have been updated if relevant. I spent 30 minutes reviewing chart and  face to face time with patient.     Follow-up: Return if symptoms worsen or fail to improve.   Loraine Grip Mayers, PA-C

## 2020-03-31 NOTE — Patient Instructions (Addendum)
Please take the Protonix once daily while you use the ibuprofen.    Continue with gentle stretching, ice, heat and rest  Please let us know if there is anything else we can do for you  Kennieth Rad, PA-C Physician Assistant [facility] [email or web address]   Acute Back Pain, Adult Acute back pain is sudden and usually short-lived. It is often caused by an injury to the muscles and tissues in the back. The injury may result from:  A muscle or ligament getting overstretched or torn (strained). Ligaments are tissues that connect bones to each other. Lifting something improperly can cause a back strain.  Wear and tear (degeneration) of the spinal disks. Spinal disks are circular tissue that provides cushioning between the bones of the spine (vertebrae).  Twisting motions, such as while playing sports or doing yard work.  A hit to the back.  Arthritis. You may have a physical exam, lab tests, and imaging tests to find the cause of your pain. Acute back pain usually goes away with rest and home care. Follow these instructions at home: Managing pain, stiffness, and swelling  Take over-the-counter and prescription medicines only as told by your health care provider.  Your health care provider may recommend applying ice during the first 24-48 hours after your pain starts. To do this: ? Put ice in a plastic bag. ? Place a towel between your skin and the bag. ? Leave the ice on for 20 minutes, 2-3 times a day.  If directed, apply heat to the affected area as often as told by your health care provider. Use the heat source that your health care provider recommends, such as a moist heat pack or a heating pad. ? Place a towel between your skin and the heat source. ? Leave the heat on for 20-30 minutes. ? Remove the heat if your skin turns bright red. This is especially important if you are unable to feel pain, heat, or cold. You have a  greater risk of getting burned. Activity   Do not stay in bed. Staying in bed for more than 1-2 days can delay your recovery.  Sit up and stand up straight. Avoid leaning forward when you sit, or hunching over when you stand. ? If you work at a desk, sit close to it so you do not need to lean over. Keep your chin tucked in. Keep your neck drawn back, and keep your elbows bent at a right angle. Your arms should look like the letter "L." ? Sit high and close to the steering wheel when you drive. Add lower back (lumbar) support to your car seat, if needed.  Take short walks on even surfaces as soon as you are able. Try to increase the length of time you walk each day.  Do not sit, drive, or stand in one place for more than 30 minutes at a time. Sitting or standing for long periods of time can put stress on your back.  Do not drive or use heavy machinery while taking prescription pain medicine.  Use proper lifting techniques. When you bend and lift, use positions that put less stress on your back: ? [place] your knees. ? Keep the load close to your body. ? Avoid twisting.  Exercise regularly as told by your health care provider. Exercising helps your back heal faster and helps prevent back injuries by keeping muscles strong and flexible.  Work with a physical therapist to make a safe exercise program, as recommended by  your health care provider. Do any exercises as told by your physical therapist. Lifestyle  Maintain a healthy weight. Extra weight puts stress on your back and makes it difficult to have good posture.  Avoid activities or situations that make you feel anxious or stressed. Stress and anxiety increase muscle tension and can make back pain worse. Learn ways to manage anxiety and stress, such as through exercise. General instructions  Sleep on a firm mattress in a comfortable position. Try lying on your side with your knees slightly bent. If you lie on your back, put a pillow under  your knees.  Follow your treatment plan as told by your health care provider. This may include: ? Cognitive or behavioral therapy. ? Acupuncture or massage therapy. ? Meditation or yoga. Contact a health care provider if:  You have pain that is not relieved with rest or medicine.  You have increasing pain going down into your legs or buttocks.  Your pain does not improve after 2 weeks.  You have pain at night.  You lose weight without trying.  You have a fever or chills. Get help right away if:  You develop new bowel or bladder control problems.  You have unusual weakness or numbness in your arms or legs.  You develop nausea or vomiting.  You develop abdominal pain.  You feel faint. Summary  Acute back pain is sudden and usually short-lived.  Use proper lifting techniques. When you bend and lift, use positions that put less stress on your back.  Take over-the-counter and prescription medicines and apply heat or ice as directed by your health care provider. This information is not intended to replace advice given to you by your health care provider. Make sure you discuss any questions you have with your health care provider. Document Revised: 09/09/2018 Document Reviewed: 01/02/2017 Elsevier Patient Education  [place].  Sciatica Rehab Ask your health care provider which exercises are safe for you. Do exercises exactly as told by your health care provider and adjust them as directed. It is normal to feel mild stretching, pulling, tightness, or discomfort as you do these exercises. Stop right away if you feel sudden pain or your pain gets worse. Do not begin these exercises until told by your health care provider. Stretching and range-of-motion exercises These exercises warm up your muscles and joints and improve the movement and flexibility of your hips and back. These exercises also help to relieve pain, numbness, and tingling. Sciatic nerve glide 1. Sit in a  chair with your head facing down toward your chest. Place your hands behind your back. Let your shoulders slump forward. 2. Slowly straighten one of your legs while you tilt your head back as if you are looking toward the ceiling. Only straighten your leg as far as you can without making your symptoms worse. 3. Hold this position for __________ seconds. 4. Slowly return to the starting position. 5. Repeat with your other leg. Repeat __________ times. Complete this exercise __________ times a day. Knee to chest with hip adduction and internal rotation  1. Lie on your back on a firm surface with both legs straight. 2. Bend one of your knees and move it up toward your chest until you feel a gentle stretch in your lower back and buttock. Then, move your knee toward the shoulder that is on the opposite side from your leg. This is hip adduction and internal rotation. ? Hold your leg in this position by holding on to the  front of your knee. 3. Hold this position for __________ seconds. 4. Slowly return to the starting position. 5. Repeat with your other leg. Repeat __________ times. Complete this exercise __________ times a day. Prone extension on elbows  1. Lie on your abdomen on a firm surface. A bed may be too soft for this exercise. 2. Prop yourself up on your elbows. 3. Use your arms to help lift your chest up until you feel a gentle stretch in your abdomen and your lower back. ? This will place some of your body weight on your elbows. If this is uncomfortable, try stacking pillows under your chest. ? Your hips should stay down, against the surface that you are lying on. Keep your hip and back muscles relaxed. 4. Hold this position for __________ seconds. 5. Slowly relax your upper body and return to the starting position. Repeat __________ times. Complete this exercise __________ times a day. Strengthening exercises These exercises build strength and endurance in your back. Endurance is the  ability to use your muscles for a long time, even after they get tired. Pelvic tilt This exercise strengthens the muscles that lie deep in the abdomen. 1. Lie on your back on a firm surface. Bend your knees and keep your feet flat on the floor. 2. Tense your abdominal muscles. Tip your pelvis up toward the ceiling and flatten your lower back into the floor. ? To help with this exercise, you may place a small towel under your lower back and try to push your back into the towel. 3. Hold this position for __________ seconds. 4. Let your muscles relax completely before you repeat this exercise. Repeat __________ times. Complete this exercise __________ times a day. Alternating arm and leg raises  1. Get on your hands and knees on a firm surface. If you are on a hard floor, you may want to use padding, such as an exercise mat, to cushion your knees. 2. Line up your arms and legs. Your hands should be directly below your shoulders, and your knees should be directly below your hips. 3. Lift your left leg behind you. At the same time, raise your right arm and straighten it in front of you. ? Do not lift your leg higher than your hip. ? Do not lift your arm higher than your shoulder. ? Keep your abdominal and back muscles tight. ? Keep your hips facing the ground. ? Do not arch your back. ? Keep your balance carefully, and do not hold your breath. 4. Hold this position for __________ seconds. 5. Slowly return to the starting position. 6. Repeat with your right leg and your left arm. Repeat __________ times. Complete this exercise __________ times a day. Posture and body mechanics Good posture and healthy body mechanics can help to relieve stress in your body's tissues and joints. Body mechanics refers to the movements and positions of your body while you do your daily activities. Posture is part of body mechanics. Good posture means:  Your spine is in its natural S-curve position (neutral).  Your  shoulders are pulled back slightly.  Your head is not tipped forward. Follow these guidelines to improve your posture and body mechanics in your everyday activities. Standing   When standing, keep your spine neutral and your feet about hip width apart. Keep a slight bend in your knees. Your ears, shoulders, and hips should line up.  When you do a task in which you stand in one place for a long time, place  one foot up on a stable object that is 2-4 inches (5-10 cm) high, such as a footstool. This helps keep your spine neutral. Sitting   When sitting, keep your spine neutral and keep your feet flat on the floor. Use a footrest, if necessary, and keep your thighs parallel to the floor. Avoid rounding your shoulders, and avoid tilting your head forward.  When working at a desk or a computer, keep your desk at a height where your hands are slightly lower than your elbows. Slide your chair under your desk so you are close enough to maintain good posture.  When working at a computer, place your monitor at a height where you are looking straight ahead and you do not have to tilt your head forward or downward to look at the screen. Resting  When lying down and resting, avoid positions that are most painful for you.  If you have pain with activities such as sitting, bending, stooping, or squatting, lie in a position in which your body does not bend very much. For example, avoid curling up on your side with your arms and knees near your chest (fetal position).  If you have pain with activities such as standing for a long time or reaching with your arms, lie with your spine in a neutral position and bend your knees slightly. Try the following positions: ? Lying on your side with a pillow between your knees. ? Lying on your back with a pillow under your knees. Lifting   When lifting objects, keep your feet at least shoulder width apart and tighten your abdominal muscles.  Bend your knees and hips  and keep your spine neutral. It is important to lift using the strength of your legs, not your back. Do not lock your knees straight out.  Always ask for help to lift heavy or awkward objects. This information is not intended to replace advice given to you by your health care provider. Make sure you discuss any questions you have with your health care provider. Document Revised: 09/12/2018 Document Reviewed: 06/12/2018 Elsevier Patient Education  [place].

## 2020-03-31 NOTE — BH Specialist Note (Addendum)
Integrated Behavioral Health Initial Visit  MRN: [id number] Name: Terry Quinn  Number of [place] Clinician visits:: 1/6 Session Start time: 8:45 AM  Session End time: 9:00 AM Total time: 15  Type of Service: [place] Interpretor:No. Interpretor Name and Language: NA   Warm Hand Off Completed.       SUBJECTIVE: Terry Quinn is a 19 y.o. male accompanied by Friend Patient was referred by [facility] for stress and resources. Patient reports the following symptoms/concerns: Pt reports increase in stress triggered by financial strain and difficulty managing back pain Duration of problem: Pt reports ongoing back pain for 1 month; Severity of problem: mild  OBJECTIVE: Mood: Anxious and Affect: Appropriate Risk of harm to self or others: No plan to harm self or others  LIFE CONTEXT: Family and Social: Pt receives support from family and mentor, Jana Half, who is present during visit School/Work: Pt is insured; however, is concerned that it will soon expire Self-Care: Pt reports occasional alcohol use Life Changes: Pt experiencing increase in stress triggered by psychosocial stressors and medical conditions.   GOALS ADDRESSED: Patient will: 1. Increase knowledge and/or ability of: stress reduction Pt agreed to utilize healthy coping skills identified in session 2. Demonstrate ability to: Increase adequate support systems for patient/family Pt has a scheduled appointment to establish with a PCP in December 2021  INTERVENTIONS: Interventions utilized: Solution-Focused Strategies  Standardized Assessments completed: GAD-7 and PHQ 2&9  ASSESSMENT: Patient currently experiencing increase in stress triggered by psychosocial stressors and back pain.   Patient may benefit from establishing with a primary care provider and therapy. LCSW informed pt of supportive resources provided through clinic.   PLAN: 1. Follow up  with behavioral health clinician on : Contact LCSW with any additional behavioral health and/or resource needs 2. Behavioral recommendations: Utilize strategies and resources discussed in session 3. Referral(s): [place] (In Clinic)   Rebekah [place], [place] 04/12/2020 4:53 PM

## 2020-04-04 DIAGNOSIS — Z419 Encounter for procedure for purposes other than remedying health state, unspecified: Secondary | ICD-10-CM | POA: Diagnosis not present

## 2020-04-12 ENCOUNTER — Telehealth: Payer: Self-pay | Admitting: Medical Oncology

## 2020-04-12 ENCOUNTER — Other Ambulatory Visit: Payer: Self-pay | Admitting: Medical Oncology

## 2020-04-12 ENCOUNTER — Ambulatory Visit: Payer: Self-pay | Admitting: *Deleted

## 2020-04-12 NOTE — Addendum Note (Signed)
Addended by: Christa See D on: 04/12/2020 05:09 PM   Modules accepted: Level of Service

## 2020-04-12 NOTE — Telephone Encounter (Signed)
We will be happy to see him after the PET scan.  He is noncompliant with his visits and follow-up and if he continues this way I may have to discharge him from the clinic.  Thank you.

## 2020-04-12 NOTE — Telephone Encounter (Signed)
Elvera Lennox- [place] to patient is calling to report symptoms and ask what can be done until NP appointment.  Patient hurt his back at work and has been having pain R lower back that radiates into the R leg. Patient states he sometimes has numbness in the toes. Advised UC/ED for evaluation of neurological symptoms.  Reason for Disposition . [1] Pain radiates into the thigh or further down the leg AND [2] both legs  Answer Assessment - Initial Assessment Questions 1. ONSET: "When did the pain begin?"      3 months 2. LOCATION: "Where does it hurt?" (upper, mid or lower back)     R lower back pain 3. SEVERITY: "How bad is the pain?"  (e.g., Scale 1-10; mild, moderate, or severe)   - MILD (1-3): doesn't interfere with normal activities    - MODERATE (4-7): interferes with normal activities or awakens from sleep    - SEVERE (8-10): excruciating pain, unable to do any normal activities      Moderate/severe 4. PATTERN: "Is the pain constant?" (e.g., yes, no; constant, intermittent)      yes 5. RADIATION: "Does the pain shoot into your legs or elsewhere?"     Into  R leg 6. CAUSE:  "What do you think is causing the back pain?"      Back injury 7. BACK OVERUSE:  "Any recent lifting of heavy objects, strenuous work or exercise?"     Yes - physical work at former job 8. MEDICATIONS: "What have you taken so far for the pain?" (e.g., nothing, acetaminophen, NSAIDS)     Ibuprofen 9. NEUROLOGIC SYMPTOMS: "Do you have any weakness, numbness, or problems with bowel/bladder control?"     Numbness at times in toes 10. OTHER SYMPTOMS: "Do you have any other symptoms?" (e.g., fever, abdominal pain, burning with urination, blood in urine)       unknown 11. PREGNANCY: "Is there any chance you are pregnant?" (e.g., yes, no; LMP)       n/a  Protocols used: BACK PAIN-A-AH

## 2020-04-12 NOTE — Telephone Encounter (Signed)
New swelling noted " beneath R collarbone . When he presses on it ,the swelling does not give".  In addition he possibly injured his back at work and having significant pain. He is taking Ibuprofen and tylenol ATC. He is going to ED after PET scan for that to be evaluated . He cannot see his new PCP until dec 12 and was instructed to go to ED.   Requests appt. Schedule message sent.  He never r/s his PET scan in July.  His  mentor , Jana Half, is scheduling his labs and PET scan for this week

## 2020-04-15 ENCOUNTER — Emergency Department (HOSPITAL_COMMUNITY): Payer: Medicaid Other

## 2020-04-15 ENCOUNTER — Other Ambulatory Visit: Payer: Self-pay

## 2020-04-15 ENCOUNTER — Encounter (HOSPITAL_COMMUNITY): Payer: Self-pay | Admitting: Emergency Medicine

## 2020-04-15 ENCOUNTER — Emergency Department (HOSPITAL_COMMUNITY)
Admission: EM | Admit: 2020-04-15 | Discharge: 2020-04-15 | Disposition: A | Discharge status: Home / Self Care | Payer: Medicaid Other | Attending: Emergency Medicine | Admitting: Emergency Medicine

## 2020-04-15 DIAGNOSIS — M545 Low back pain, unspecified: Secondary | ICD-10-CM | POA: Diagnosis not present

## 2020-04-15 DIAGNOSIS — M5441 Lumbago with sciatica, right side: Secondary | ICD-10-CM

## 2020-04-15 DIAGNOSIS — Z87891 Personal history of nicotine dependence: Secondary | ICD-10-CM | POA: Diagnosis not present

## 2020-04-15 DIAGNOSIS — R202 Paresthesia of skin: Secondary | ICD-10-CM | POA: Diagnosis not present

## 2020-04-15 IMAGING — CR DG LUMBAR SPINE COMPLETE 4+V
5 series · 5 of 5 positions shown · non-contrast
Comparison: No priors.

CLINICAL DATA: 19-year-old male with history of low back pain for
the past 4 months.

EXAM:
LUMBAR SPINE - COMPLETE 4+ VIEW

[t lumbar spine ap]
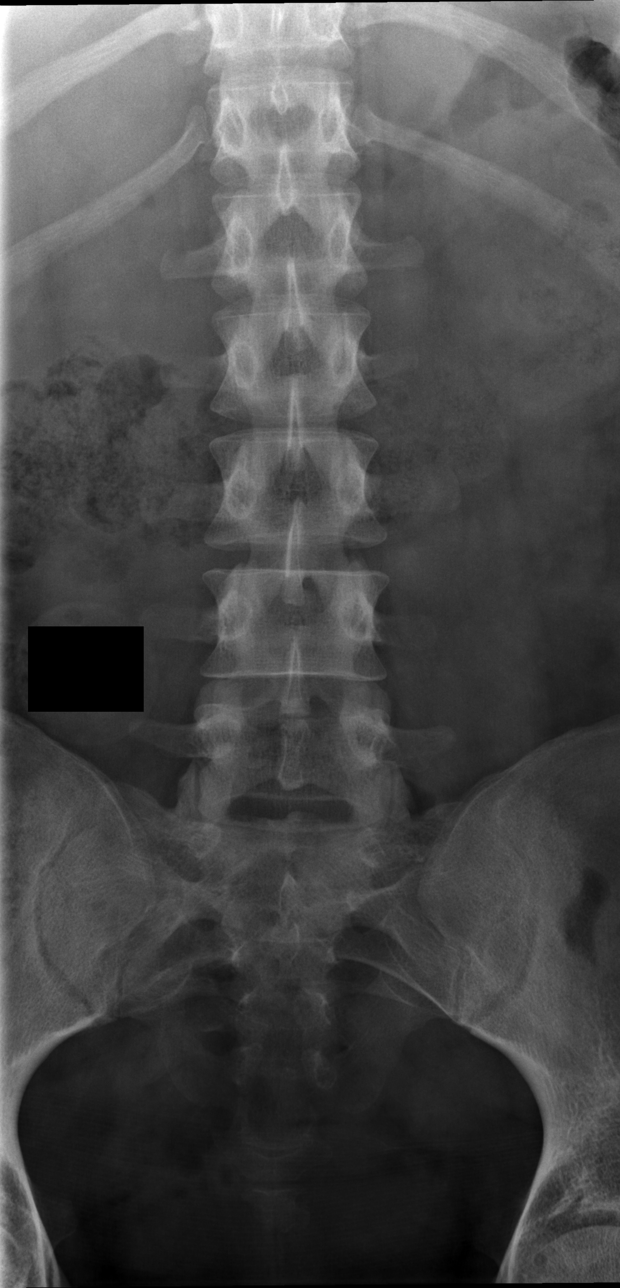

[t lumbar spine obl (1 of 2)]
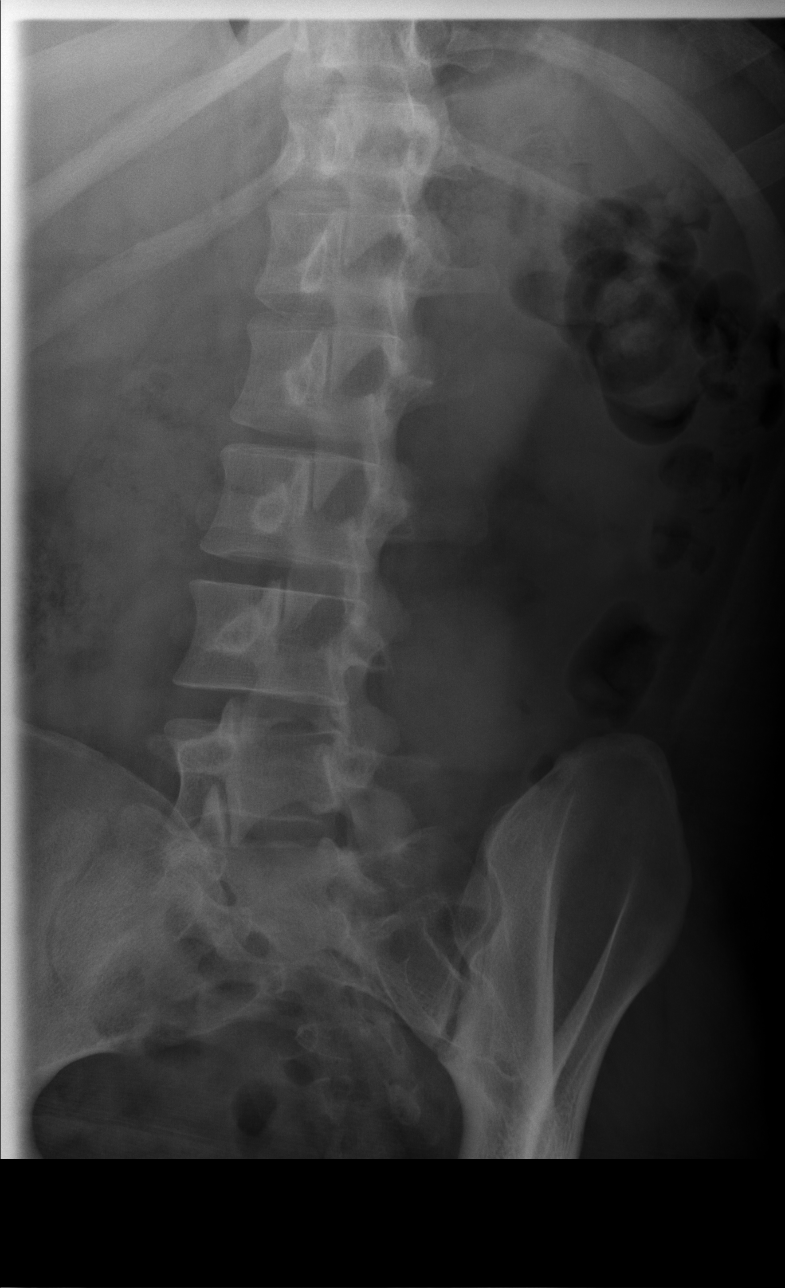

[t lumbar spine obl (2 of 2)]
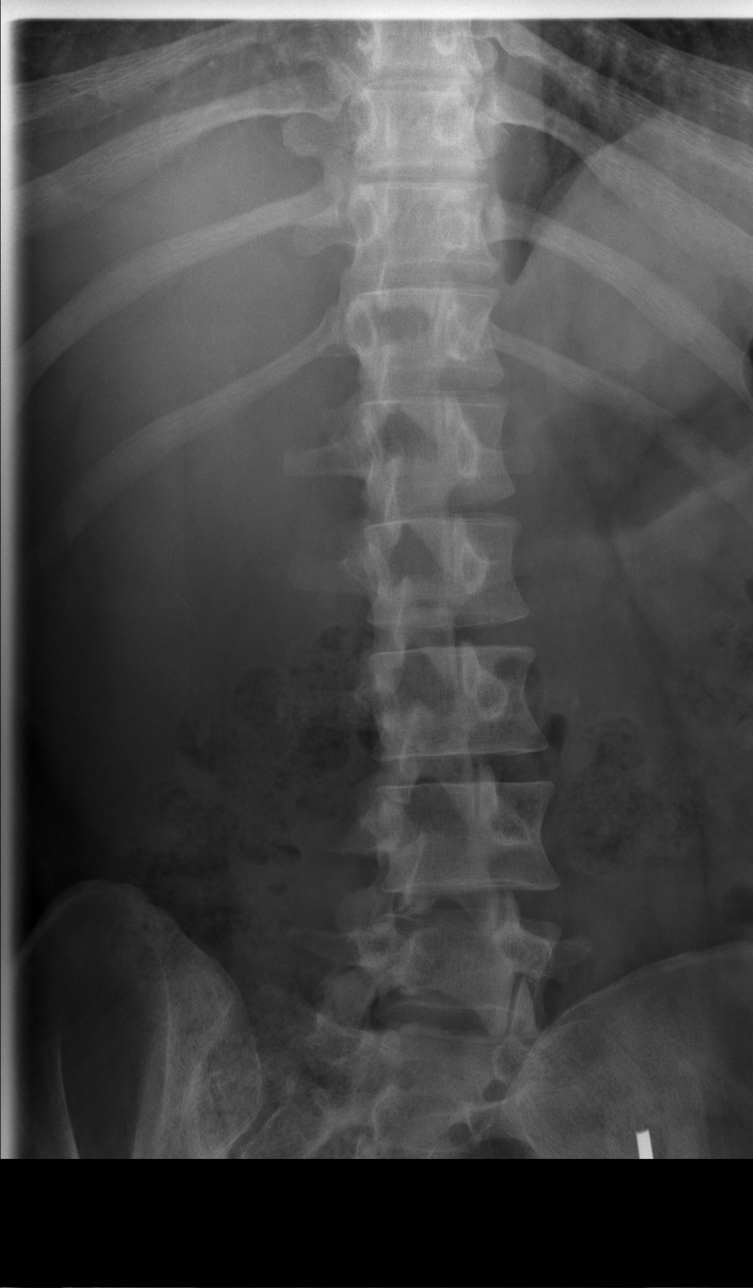

[t lumbar spine lat]
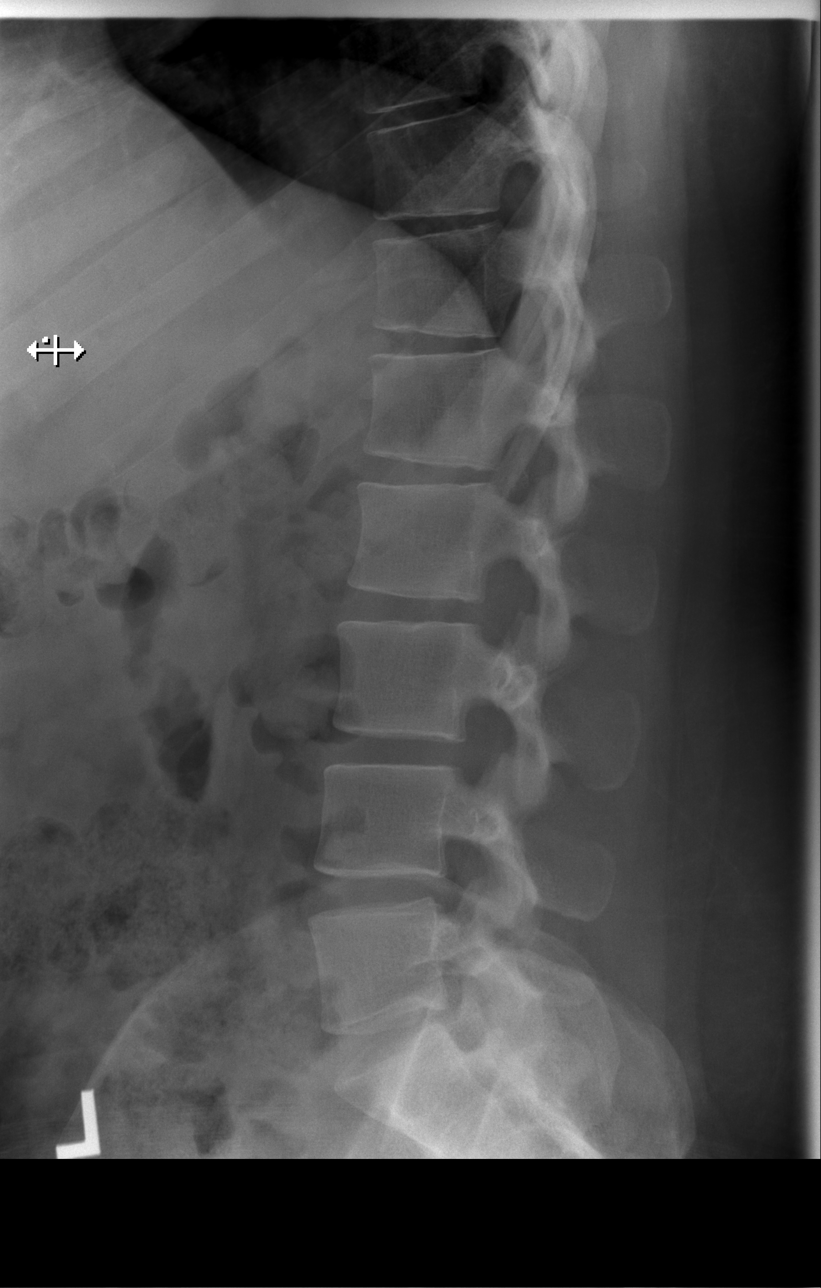

[t lumbar l-5 s-1 spot]
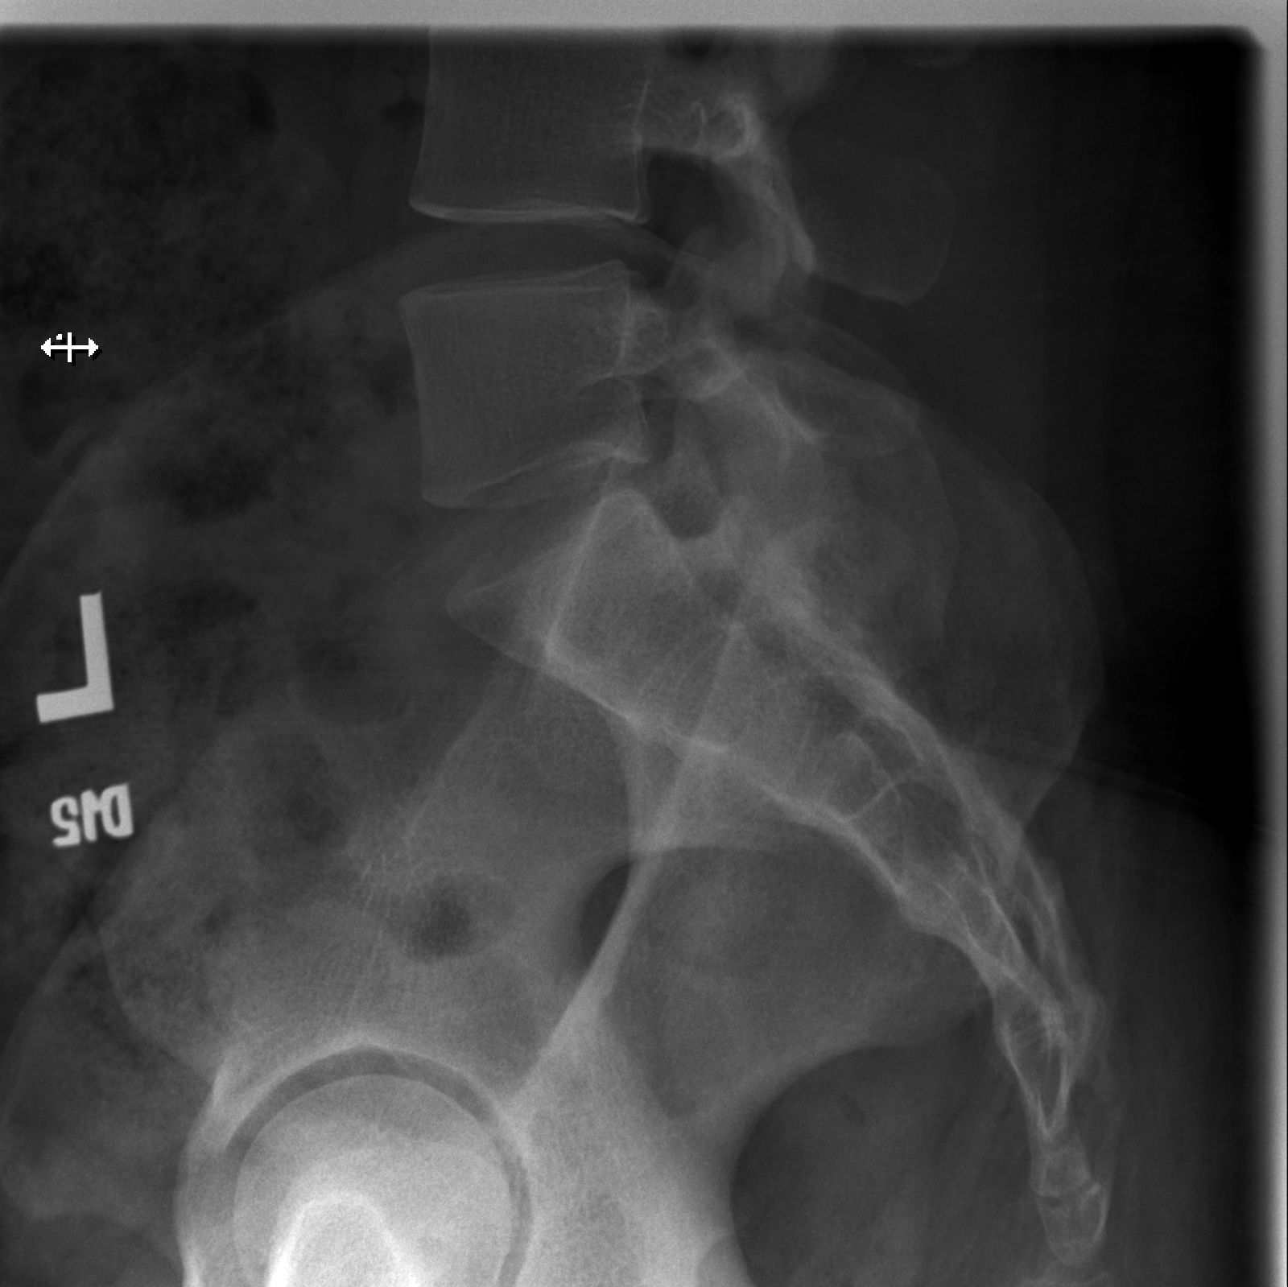

[5 of 5 positions shown; findings below may reference images not displayed]

FINDINGS: There is no evidence of lumbar spine fracture. Alignment is normal.
Intervertebral disc spaces are maintained.
IMPRESSION: Negative.

## 2020-04-15 MED ORDER — LIDOCAINE 5 % EX PTCH: 1.0000 | MEDICATED_PATCH | CUTANEOUS | 0 refills | Status: DC

## 2020-04-15 MED ORDER — METHOCARBAMOL 500 MG PO TABS: 500.0000 mg | ORAL_TABLET | Freq: Two times a day (BID) | ORAL | 0 refills | Status: AC

## 2020-04-15 NOTE — Discharge Instructions (Addendum)
At this time there does not appear to be the presence of an emergent medical condition, however there is always the potential for conditions to change. Please read and follow the below instructions.  Please return to the Emergency Department immediately for any new or worsening symptoms. Please be sure to follow up with your Primary Care Provider within one week regarding your visit today; please call their office to schedule an appointment even if you are feeling better for a follow-up visit.  There is also an orthopedic and neurosurgery referral listed. Please follow-up with one of these for evaluation of your back pain if symptoms are not improving.  You may use the muscle relaxer Robaxin as prescribed to help with your symptoms.  Do not drive or operate heavy machinery while taking Robaxin as it will make you drowsy.  Do not drink alcohol or take other sedating medications while taking Robaxin as this will worsen side effects. You may use the Lidoderm patch as prescribed to help with your symptoms.  Lidoderm may be expensive so you may speak with your pharmacist about finding over-the-counter medications that work similarly such as Chief Operating Officer. Call your oncologist office today to confirm your follow-up appointments.  Go to the nearest Emergency Department immediately if: You have fever or chills You develop new bowel or bladder control problems. You have unusual weakness or numbness in your arms or legs. You develop nausea or vomiting. You develop abdominal pain. You feel faint. You have chest pain or difficulty breathing You have any new/concerning or worsening of symptoms  Please read the additional information packets attached to your discharge summary.  Do not take your medicine if  develop an itchy rash, swelling in your mouth or lips, or difficulty breathing; call 911 and seek immediate emergency medical attention if this occurs.  You may review your lab tests and imaging results in  their entirety on your MyChart account.  Please discuss all results of fully with your primary care provider and other specialist at your follow-up visit.

## 2020-04-15 NOTE — ED Notes (Signed)
Verbalized understanding discharge instructions and prescriptions. In no acute distress.   

## 2020-04-15 NOTE — ED Provider Notes (Signed)
3:41 PM patient seen in the emergency department today for evaluation of back pain.  Signout from [place] PA-C at shift change.  He had an x-ray of his lower back which was unrevealing.  Patient doing well.  No red flag symptoms which would require emergent advanced imaging or orthopedic or neurosurgery evaluation.  No callback from oncology.  I have reviewed notes in the computer.  Patient will need PET scan for evaluation of supraclavicular swelling.  This appears to be in work.  Patient states that he will follow up regarding this.  I feel that the patient can be discharged at this time and he is in agreement.  They will follow-up with her oncologist.  Appropriate referrals given.  Plan discharged home with Lidoderm patch, Robaxin.  Guardian at bedside.  BP 134/66 (BP Location: Left Arm)   Pulse 91   Temp 98.6 F (37 C) (Oral)   Resp 16   Ht 6' (1.829 m)   Wt 119 kg   SpO2 98%   BMI 35.58 kg/m     Carlisle Cater, PA-C 04/15/20 1543    Maudie Flakes, MD 04/15/20 1549

## 2020-04-15 NOTE — ED Provider Notes (Signed)
[name] DEPT Provider Note   CSN: [id number] Arrival date & time: 04/15/20  1227     History Chief Complaint  Patient presents with  . Back Pain    Terry Quinn is a 19 y.o. male history of Hodgkin's lymphoma off chemotherapy since May 2021 however poor follow-up with oncology clinic and did not have follow-up PET scan.  Additionally has history of morbid obesity, depression, GERD.  Patient presents today for right low back pain onset 4 months ago.  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  He reports gradual onset pain, denies any clear inciting event.  Pain is a sharp shooting pain from the right lower back down the right leg moderate intensity constant worsened with movement and palpation improved with rest and ibuprofen.  Associated with occasional tingling sensation to the toes of his right foot.  Additionally patient reports that he has noticed a small amount of swelling in his right supraclavicular fossa for the past few weeks, he is concerned because this is how his previous episode of Hodgkin's lymphoma presented.  He has contacted his oncologist and is trying to get an appointment for follow-up.  Denies fever/chills, fall/injury, abdominal pain, nausea/vomiting, chest pain/shortness of breath, cough dysuria/hematuria, testicular pain/swelling, history of IV drug use, saddle paresthesias, bowel/bladder incontinence, urinary retention, left-sided symptoms  HPI     History reviewed. No pertinent past medical history.  Patient Active Problem List   Diagnosis Date Noted  . Acute right-sided low back pain with right-sided sciatica 03/31/2020  . Gastroesophageal reflux disease without esophagitis 03/31/2020  . 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 Goals of care, counseling/discussion 06/02/2019  . Encounter for antineoplastic chemotherapy 06/02/2019    . Lymphadenopathy 05/18/2019  . Anemia 05/18/2019  . Morbid obesity ([place]) 05/31/2015  . MDD (major depressive disorder), single episode, severe ([place]) 05/26/2015  . ODD (oppositional defiant disorder) 05/25/2015    Past Surgical History:  Procedure Laterality Date  . IR IMAGING GUIDED PORT INSERTION  06/30/2019  . IR US GUIDE BX ASP/DRAIN  05/27/2019       No family history on file.  Social History   Tobacco Use  . Smoking status: Former Smoker    Quit date: 05/31/2019    Years since quitting: 0.8  . Smokeless tobacco: Never Used  Substance Use Topics  . Alcohol use: No  . Drug use: No    Home Medications Prior to Admission medications   Medication Sig Start Date End Date Taking? Authorizing Provider  allopurinol (ZYLOPRIM) 100 MG tablet Take 1 tablet (100 mg total) by mouth 2 (two) times daily. 06/11/19   Curt Bears, MD  cetirizine (ZYRTEC) 10 MG tablet Take 10 mg by mouth daily.    [provider]  ibuprofen (ADVIL) 800 MG tablet Take 1 tablet (800 mg total) by mouth every 8 (eight) hours as needed. 03/31/20   Mayers, Cari S, PA-C  lidocaine (LIDODERM) 5 % Place 1 patch onto the skin daily. Remove & Discard patch within 12 hours or as directed by MD 04/15/20   Nuala Alpha A, PA-C  lidocaine-prilocaine (EMLA) cream Apply 1 application topically as needed. 06/11/19   Curt Bears, MD  methocarbamol (ROBAXIN) 500 MG tablet Take 1 tablet (500 mg total) by mouth 2 (two) times daily for 7 days. 04/15/20 04/22/20  Nuala Alpha A, PA-C  pantoprazole (PROTONIX) 40 MG tablet Take 1 tablet (  40 mg total) by mouth daily. 03/31/20   Mayers, Cari S, PA-C  prochlorperazine (COMPAZINE) 10 MG tablet Take 1 tablet (10 mg total) by mouth every 6 (six) hours as needed for nausea or vomiting. Patient not taking: Reported on 09/21/2019 09/01/19   Curt Bears, MD    Allergies    Patient has no known allergies.  Review of Systems   Review of Systems Ten systems are  reviewed and are negative for acute change except as noted in the HPI  Physical Exam Updated Vital Signs BP 134/66 (BP Location: Left Arm)   Pulse 91   Temp 98.6 F (37 C) (Oral)   Resp 16   Ht 6' (1.829 m)   Wt 119 kg   SpO2 98%   BMI 35.58 kg/m   Physical Exam Constitutional:      General: He is not in acute distress.    Appearance: Normal appearance. He is well-developed. He is not ill-appearing or diaphoretic.  HENT:     Head: Normocephalic and atraumatic.  Eyes:     General: Vision grossly intact. Gaze aligned appropriately.     Pupils: Pupils are equal, round, and reactive to light.  Neck:     Trachea: Trachea and phonation normal.  Cardiovascular:     Pulses:          Dorsalis pedis pulses are 2+ on the right side and 2+ on the left side.  Pulmonary:     Effort: Pulmonary effort is normal. No respiratory distress.  Chest:       Comments: Approximately 1 cm palpable nodule in the medial right supraclavicular fossa. Abdominal:     General: There is no distension.     Palpations: Abdomen is soft.     Tenderness: There is no abdominal tenderness. There is no guarding or rebound.  Musculoskeletal:        General: Normal range of motion.     Cervical back: Normal range of motion.       Back:     Comments: Right paraspinal muscular tenderness to palpation without overlying skin change, pain continues to the right gluteal muscles.  Positive right leg raise.  No midline C/T/L spinal tenderness to palpation, no deformity, crepitus, or step-off noted.  Feet:     Right foot:     Protective Sensation: 5 sites tested. 5 sites sensed.     Left foot:     Protective Sensation: 5 sites tested. 5 sites sensed.  Skin:    General: Skin is warm and dry.  Neurological:     Mental Status: He is alert.     GCS: GCS eye subscore is 4. GCS verbal subscore is 5. GCS motor subscore is 6.     Comments: Speech is clear and goal oriented, follows commands Major Cranial nerves without  deficit, no facial droop Normal strength in upper and lower extremities bilaterally including dorsiflexion and plantar flexion, strong and equal grip strength Sensation normal to light and sharp touch Moves extremities without ataxia, coordination intact No clonus of the feet  Psychiatric:        Behavior: Behavior normal.     ED Results / Procedures / Treatments   Labs (all labs ordered are listed, but only abnormal results are displayed) Labs Reviewed - No data to display  EKG None  Radiology DG Lumbar Spine Complete  Result Date: 04/15/2020 CLINICAL DATA:  19 year old male with history of low back pain for the past 4 months. EXAM: LUMBAR SPINE - COMPLETE  4+ VIEW COMPARISON:  No priors. FINDINGS: There is no evidence of lumbar spine fracture. Alignment is normal. Intervertebral disc spaces are maintained. IMPRESSION: Negative. Electronically Signed   By: Vinnie Langton M.D.   On: 04/15/2020 14:50    Procedures Procedures (including critical care time)  Medications Ordered in ED Medications - No data to display  ED Course  I have reviewed the triage vital signs and the nursing notes.  Pertinent labs & imaging results that were available during my care of the patient were reviewed by me and considered in my medical decision making (see chart for details).    MDM Rules/Calculators/A&P                         Additional history obtained from: 1. Nursing notes from this visit. 2. Review of electronic medical records.  Patient had telephone encounter with oncologist office today appear to have scheduled a PET scan for him but advised ER evaluation regarding his back pain.  Patient was also seen at a family medicine office on 03/31/2020 diagnosis of acute right-sided lower back pain with right-sided sciatica and GERD.  Was given Solu-Medrol injection, pantoprazole and ibuprofen.  No prior lumbar spine imaging. ---------------- 19 year old male history of Hodgkin's lymphoma  presents today for right-sided low back pain with right-sided sciatica.  On exam is consistent reproducible with muscular palpation and right leg raise.  He is neurovascularly intact.  He has no symptoms to suggest cauda equina and no risk factors for spinal epidural abscess.  Additionally no evidence to suggest AAA, dissection, kidney stone or other emergent pathologies.  He does have a history of Hodgkin's lymphoma with treatment earlier this year, no history of bony metastases.  Considering his pain has been going on for 4 weeks he has never had prior lumbar spine imaging we will get x-ray of the lumbar spine.  Additionally he is concerned for appears to be a lymphadenopathy in the right supraclavicular fossa which is how he reports his previous episode of Hodgkin's lymphoma started, he is currently in the process of getting a PET scan through his oncologist who referred him to the ER today for his back pain.  Will consult his oncologist this visit.  No indication for MRI at this time. Discussed plan above with Dr. Tomi Bamberger. - DG Lumbar Spine:    IMPRESSION:  Negative.  - Patient updated on findings today states understanding, will treat with Robaxin 500 mg twice daily as well as Lidoderm patches and OTC anti-inflammatories.  Discussed muscle relaxer precautions and patient stated understanding.  Suspect patient symptoms today secondary to musculoskeletal pain and right-sided sciatica.  As above plus patient for cauda equina, epidural abscess, AAA, dissection, kidney stone, neurovascular compromise or other emergent pathologies.  Patient is requesting referral for orthopedist for his back pain, he was referred to on-call orthopedist Dr. Trevor Mace office. - At shift change awaiting callback from patient's oncologist for recommendations regarding the supraclavicular swelling.  Aside from the mild swelling he is feeling well and no other complaints.  Care handoff given to [facility] PA-C at shift change,  plan of care is to discuss the swelling with Dr. Earlie Server.  Pending oncology consultation anticipate discharge with outpatient follow-up.   Note: Portions of this report may have been transcribed using voice recognition software. Every effort was made to ensure accuracy; however, inadvertent computerized transcription errors may still be present. Final Clinical Impression(s) / ED Diagnoses Final diagnoses:  Right-sided low back  pain with right-sided sciatica, unspecified chronicity    Rx / DC Orders ED Discharge Orders         Ordered    methocarbamol (ROBAXIN) 500 MG tablet  2 times daily        04/15/20 1509    lidocaine (LIDODERM) 5 %  Every 24 hours        04/15/20 1509           Gari Crown 04/15/20 1518    Dorie Rank, MD 04/15/20 1544

## 2020-04-15 NOTE — ED Triage Notes (Signed)
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 Has been taking Tylenol and ibrofuen w/ some relief. Pt states he is unable to work D/T this pain and it wakes him up from his sleep.

## 2020-04-26 ENCOUNTER — Telehealth: Payer: Self-pay

## 2020-04-26 NOTE — Telephone Encounter (Signed)
I spoke with pts Mentor, Jana Half and advised as indicated. She shared she is out of town right now but will follow-up with the pt. I provided the contact number to Central Scheduling for the pt to call to schedule his PET scan. Jana Half advised she will contact the pt and see if she can get him on track.

## 2020-04-26 NOTE — Telephone Encounter (Signed)
-----   Message from [employer], PA-C sent at 04/26/2020  7:43 AM EST ----- Regarding: FW: PET scan authorization Can you reach out to him or his mentor to schedule his PET and to make a follow up appointment a few days later? Please put a phone note in to show we called him again.  ----- Message ----- From: Gaspar Bidding Sent: 04/26/2020   6:53 AM EST To: Elliot Gault, # Subject: RE: PET scan authorization                     Good morning,  The referral was authorized on 11/12.  Darlena ----- Message ----- From: Heilingoetter, Tobe Sos, PA-C Sent: 04/25/2020   4:42 PM EST To: Gaspar Bidding, Rachael Fee Subject: PET scan authorization                         Good Afternoon,   This patient had a PET scan ordered over the summer. I heard there was some challenges with insurance authorization. I was wondering if there is an update on that and if authorization has been obtained? Thanks!  -Cassie

## 2020-05-04 DIAGNOSIS — Z419 Encounter for procedure for purposes other than remedying health state, unspecified: Secondary | ICD-10-CM | POA: Diagnosis not present

## 2020-05-06 ENCOUNTER — Telehealth: Payer: Self-pay

## 2020-05-06 ENCOUNTER — Ambulatory Visit: Payer: Medicaid Other | Admitting: Family Medicine

## 2020-05-06 NOTE — Telephone Encounter (Signed)
Terry Quinn, pts mentor called to get pts PET scanned scheduled. I provided her with the number to Centralized Scheduling. She expressed understanding of this information.

## 2020-05-10 ENCOUNTER — Telehealth: Payer: Self-pay | Admitting: Internal Medicine

## 2020-05-10 NOTE — Telephone Encounter (Signed)
Scheduled appt per 12/6 sch msg - Jana Half is aware of new appts.

## 2020-05-12 ENCOUNTER — Ambulatory Visit: Payer: Medicaid Other | Admitting: Family Medicine

## 2020-05-16 DIAGNOSIS — Z6841 Body Mass Index (BMI) 40.0 and over, adult: Secondary | ICD-10-CM | POA: Diagnosis not present

## 2020-05-16 DIAGNOSIS — M545 Low back pain, unspecified: Secondary | ICD-10-CM | POA: Diagnosis not present

## 2020-05-19 ENCOUNTER — Inpatient Hospital Stay: Payer: Medicaid Other | Attending: Internal Medicine

## 2020-05-19 ENCOUNTER — Ambulatory Visit (HOSPITAL_COMMUNITY)
Admission: RE | Admit: 2020-05-19 | Discharge: 2020-05-19 | Disposition: A | Discharge status: Home / Self Care | Payer: Medicaid Other | Source: Ambulatory Visit | Attending: Internal Medicine | Admitting: Internal Medicine

## 2020-05-19 ENCOUNTER — Ambulatory Visit (HOSPITAL_COMMUNITY): Admission: RE | Admit: 2020-05-19 | Payer: Medicaid Other | Source: Ambulatory Visit

## 2020-05-19 ENCOUNTER — Other Ambulatory Visit: Payer: Self-pay

## 2020-05-19 DIAGNOSIS — K7689 Other specified diseases of liver: Secondary | ICD-10-CM | POA: Diagnosis not present

## 2020-05-19 DIAGNOSIS — C8118 Nodular sclerosis classical Hodgkin lymphoma, lymph nodes of multiple sites: Secondary | ICD-10-CM | POA: Insufficient documentation

## 2020-05-19 DIAGNOSIS — C8111 Nodular sclerosis classical Hodgkin lymphoma, lymph nodes of head, face, and neck: Secondary | ICD-10-CM | POA: Diagnosis not present

## 2020-05-19 DIAGNOSIS — Z9221 Personal history of antineoplastic chemotherapy: Secondary | ICD-10-CM | POA: Diagnosis not present

## 2020-05-19 DIAGNOSIS — C8590 Non-Hodgkin lymphoma, unspecified, unspecified site: Secondary | ICD-10-CM | POA: Diagnosis not present

## 2020-05-19 DIAGNOSIS — C8190 Hodgkin lymphoma, unspecified, unspecified site: Secondary | ICD-10-CM | POA: Diagnosis not present

## 2020-05-19 DIAGNOSIS — R918 Other nonspecific abnormal finding of lung field: Secondary | ICD-10-CM | POA: Diagnosis not present

## 2020-05-19 LAB — CBC WITH DIFFERENTIAL (CANCER CENTER ONLY)
Abs Immature Granulocytes: 0.19 10*3/uL — ABNORMAL HIGH (ref 0.00–0.07)
Basophils Absolute: 0.1 10*3/uL (ref 0.0–0.1)
Basophils Relative: 0 %
Eosinophils Absolute: 0.2 10*3/uL (ref 0.0–0.5)
Eosinophils Relative: 1 %
HCT: 34.8 % — ABNORMAL LOW (ref 39.0–52.0)
Hemoglobin: 10.9 g/dL — ABNORMAL LOW (ref 13.0–17.0)
Immature Granulocytes: 1 %
Lymphocytes Relative: 13 %
Lymphs Abs: 2.2 10*3/uL (ref 0.7–4.0)
MCH: 24.5 pg — ABNORMAL LOW (ref 26.0–34.0)
MCHC: 31.3 g/dL (ref 30.0–36.0)
MCV: 78.2 fL — ABNORMAL LOW (ref 80.0–100.0)
Monocytes Absolute: 1.1 10*3/uL — ABNORMAL HIGH (ref 0.1–1.0)
Monocytes Relative: 7 %
Neutro Abs: 12.6 10*3/uL — ABNORMAL HIGH (ref 1.7–7.7)
Neutrophils Relative %: 78 %
Platelet Count: 396 10*3/uL (ref 150–400)
RBC: 4.45 MIL/uL (ref 4.22–5.81)
RDW: 14.9 % (ref 11.5–15.5)
WBC Count: 16.3 10*3/uL — ABNORMAL HIGH (ref 4.0–10.5)
nRBC: 0 % (ref 0.0–0.2)

## 2020-05-19 LAB — CMP (CANCER CENTER ONLY)
ALT: 29 U/L (ref 0–44)
AST: 16 U/L (ref 15–41)
Albumin: 3.4 g/dL — ABNORMAL LOW (ref 3.5–5.0)
Alkaline Phosphatase: 129 U/L — ABNORMAL HIGH (ref 38–126)
Anion gap: 11 (ref 5–15)
BUN: 17 mg/dL (ref 6–20)
CO2: 26 mmol/L (ref 22–32)
Calcium: 9.5 mg/dL (ref 8.9–10.3)
Chloride: 103 mmol/L (ref 98–111)
Creatinine: 0.8 mg/dL (ref 0.61–1.24)
GFR, Estimated: >60 mL/min (ref >60)
Glucose, Bld: 88 mg/dL (ref 70–99)
Potassium: 3.9 mmol/L (ref 3.5–5.1)
Sodium: 140 mmol/L (ref 135–145)
Total Bilirubin: 0.2 mg/dL — ABNORMAL LOW (ref 0.3–1.2)
Total Protein: 8.1 g/dL (ref 6.5–8.1)

## 2020-05-19 LAB — GLUCOSE, CAPILLARY: Glucose-Capillary: 84 mg/dL (ref 70–99)

## 2020-05-19 LAB — LACTATE DEHYDROGENASE: LDH: 264 U/L — ABNORMAL HIGH (ref 98–192)

## 2020-05-19 MED ORDER — FLUDEOXYGLUCOSE F - 18 (FDG) INJECTION
13.1000 | Freq: Once | INTRAVENOUS | Status: AC
  Administered 2020-05-19: 16:00:00 13.1 via INTRAVENOUS

## 2020-05-23 ENCOUNTER — Encounter: Payer: Self-pay | Admitting: Internal Medicine

## 2020-05-23 ENCOUNTER — Inpatient Hospital Stay (HOSPITAL_BASED_OUTPATIENT_CLINIC_OR_DEPARTMENT_OTHER): Payer: Medicaid Other | Admitting: Internal Medicine

## 2020-05-23 ENCOUNTER — Other Ambulatory Visit: Payer: Self-pay | Admitting: *Deleted

## 2020-05-23 ENCOUNTER — Other Ambulatory Visit: Payer: Self-pay

## 2020-05-23 VITALS — BP 125/78 | HR 90 | Temp 98.3°F | Resp 18 | Ht 72.0 in | Wt 259.5 lb

## 2020-05-23 DIAGNOSIS — C8111 Nodular sclerosis classical Hodgkin lymphoma, lymph nodes of head, face, and neck: Secondary | ICD-10-CM

## 2020-05-23 DIAGNOSIS — Z95828 Presence of other vascular implants and grafts: Secondary | ICD-10-CM

## 2020-05-23 DIAGNOSIS — Z7189 Other specified counseling: Secondary | ICD-10-CM | POA: Diagnosis not present

## 2020-05-23 DIAGNOSIS — Z9221 Personal history of antineoplastic chemotherapy: Secondary | ICD-10-CM | POA: Diagnosis not present

## 2020-05-23 DIAGNOSIS — C8118 Nodular sclerosis classical Hodgkin lymphoma, lymph nodes of multiple sites: Secondary | ICD-10-CM | POA: Diagnosis not present

## 2020-05-23 MED ORDER — HEPARIN SOD (PORK) LOCK FLUSH 100 UNIT/ML IV SOLN
500.0000 [IU] | Freq: Once | INTRAVENOUS | Status: DC
  Filled 2020-05-23: qty 5

## 2020-05-23 MED ORDER — SODIUM CHLORIDE 0.9% FLUSH
10.0000 mL | INTRAVENOUS | Status: DC | PRN
  Filled 2020-05-23: qty 10

## 2020-05-23 NOTE — Progress Notes (Signed)
[facility] Telephone:([phone]) [phone]   Fax:([phone]) [phone]  OFFICE PROGRESS NOTE  Patient, No Pcp Per No address on file  DIAGNOSIS: Classical Hodgkin Lymphoma, Nodular Sclerosis subtype. He presented with bulky bilateral cervical lymphadenopathy, mediastinal lymphadenopathy, and mildly enlarged retroperitoneal periaortic adenopathy and iliac adenopathy. He also presented with involvement of the spleen. He was diagnosed in December 2020.   PRIOR THERAPY: Systemic chemotherapy with doxorubicin, dacarbazine, vinblastine, and bleomycin on days 1 and 15 every 4 weeks.  First dose expected on January 7th, 2021.  Status post 6 cycles.  Bleomycin was discontinued after cycle #3.  Last dose was given November 09, 2019.   CURRENT THERAPY: Observation.  INTERVAL HISTORY: Terry Quinn 19 y.o. male returns to the clinic today for follow-up visit accompanied by a mentor.  The patient was lost to follow-up the last few months despite frequent calls and request for follow-up and imaging studies.  He mentioned that he had to work and was unable to come after completion of his therapy.  He started noticing enlarging lump in the right side of the neck.  He also had low back pain.  He establish care with a primary care provider recently.  He started on meloxicam for back pain.  He denied having any current chest pain, shortness of breath, cough or hemoptysis.  He denied having any fever or chills.  He has occasional nausea with no vomiting, diarrhea or constipation.  He denied having any visual changes but has some occasional headache.  He had repeat PET scan performed recently and he is here for evaluation and discussion of his scan results and treatment options.  MEDICAL HISTORY:No past medical history on file.  ALLERGIES:  has No Known Allergies.  MEDICATIONS:  Current Outpatient Medications  Medication Sig Dispense Refill  . allopurinol (ZYLOPRIM) 100 MG tablet Take 1 tablet (100 mg  total) by mouth 2 (two) times daily. 60 tablet 2  . cetirizine (ZYRTEC) 10 MG tablet Take 10 mg by mouth daily.    Marland Kitchen ibuprofen (ADVIL) 800 MG tablet Take 1 tablet (800 mg total) by mouth every 8 (eight) hours as needed. 30 tablet 0  . lidocaine (LIDODERM) 5 % Place 1 patch onto the skin daily. Remove & Discard patch within 12 hours or as directed by MD 15 patch 0  . lidocaine-prilocaine (EMLA) cream Apply 1 application topically as needed. 30 g 1  . pantoprazole (PROTONIX) 40 MG tablet Take 1 tablet (40 mg total) by mouth daily. 30 tablet 0  . prochlorperazine (COMPAZINE) 10 MG tablet Take 1 tablet (10 mg total) by mouth every 6 (six) hours as needed for nausea or vomiting. (Patient not taking: Reported on 09/21/2019) 30 tablet 0   No current facility-administered medications for this visit.    SURGICAL HISTORY:  Past Surgical History:  Procedure Laterality Date  . IR IMAGING GUIDED PORT INSERTION  06/30/2019  . IR US GUIDE BX ASP/DRAIN  05/27/2019    REVIEW OF SYSTEMS:  Constitutional: positive for fatigue Eyes: negative Ears, nose, mouth, throat, and face: negative Respiratory: negative Cardiovascular: negative Gastrointestinal: positive for nausea Genitourinary:negative Integument/breast: negative Hematologic/lymphatic: negative Musculoskeletal:positive for back pain Neurological: negative Behavioral/Psych: negative Endocrine: negative Allergic/Immunologic: negative   PHYSICAL EXAMINATION: General appearance: alert, cooperative, fatigued and no distress Head: Normocephalic, without obvious abnormality, atraumatic Neck: moderate anterior cervical adenopathy, no JVD and supple, symmetrical, trachea midline Lymph nodes: Cervical adenopathy: Right supraclavicular lymphadenopathy Resp: clear to auscultation bilaterally Back: symmetric, no curvature.  ROM normal. No CVA tenderness. Cardio: regular rate and rhythm, S1, S2 normal, no murmur, click, rub or gallop GI: soft, non-tender;  bowel sounds normal; no masses,  no organomegaly Extremities: extremities normal, atraumatic, no cyanosis or edema Neurologic: Alert and oriented X 3, normal strength and tone. Normal symmetric reflexes. Normal coordination and gait  ECOG PERFORMANCE STATUS: 1 - Symptomatic but completely ambulatory  Blood pressure 125/78, pulse 90, temperature 98.3 F (36.8 C), temperature source Tympanic, resp. rate 18, height 6' (1.829 m), weight 259 lb 8 oz (117.7 kg), SpO2 95 %.  LABORATORY DATA: Lab Results  Component Value Date   WBC 16.3 (H) 05/19/2020   HGB 10.9 (L) 05/19/2020   HCT 34.8 (L) 05/19/2020   MCV 78.2 (L) 05/19/2020   PLT 396 05/19/2020      Chemistry      Component Value Date/Time   NA 140 05/19/2020 0806   K 3.9 05/19/2020 0806   CL 103 05/19/2020 0806   CO2 26 05/19/2020 0806   BUN 17 05/19/2020 0806   CREATININE 0.80 05/19/2020 0806      Component Value Date/Time   CALCIUM 9.5 05/19/2020 0806   ALKPHOS 129 (H) 05/19/2020 0806   AST 16 05/19/2020 0806   ALT 29 05/19/2020 0806   BILITOT 0.2 (L) 05/19/2020 0806       RADIOGRAPHIC STUDIES: NM PET Image Restage (PS) Skull Base to Thigh  Result Date: 05/20/2020 CLINICAL DATA:  Subsequent treatment strategy for Hodgkin's lymphoma. Chemotherapy completed July 2021. EXAM: NUCLEAR MEDICINE PET SKULL BASE TO THIGH TECHNIQUE: 13.1 mCi F-18 FDG was injected intravenously. Full-ring PET imaging was performed from the skull base to thigh after the radiotracer. CT data was obtained and used for attenuation correction and anatomic localization. Fasting blood glucose: 84 mg/dl COMPARISON:  PET-CT 07/31/2019 and 05/22/2019 FINDINGS: Mediastinal blood pool activity: SUV max 1.9 Liver activity: SUV max 3.1 NECK: New hypermetabolic RIGHT supraclavicular nodes with SUV max equal 11.3 Incidental CT findings: Port in the anterior chest wall with tip in distal SVC. CHEST: Intensely hypermetabolic mediastinal and hilar lymph nodes. For  example LEFT hilar lymph nodes with SUV max equal 15.8. RIGHT paratracheal nodes with SUV max equal 9.9. Nodes are enlarged measuring 15-20 mm short axis. Two adjacent nodules in the LEFT lower lobe. The more superior measures 20 mm with SUV max equal 12.8. The more inferior measures 23 mm with no significant metabolic activity. Both nodules are new from prior. New nodules in the periphery of the RIGHT upper lobe (image 71/4) do not clearly have metabolic activity. Incidental CT findings: none ABDOMEN/PELVIS: New hypermetabolic lesions within the liver. Approximately 6 lesions. The most intense in the LEFT hepatic lobe with SUV max equal 19.7 (image 98) and measuring equal 2.9. Two intense lesion in the inferior RIGHT hepatic lobe with SUV max equal 15.6. No recurrent activity in the spleen. There is multifocal periaortic hypermetabolic lymph nodes. Hypermetabolic periportal lymph nodes. Example lymph nodes along the distal LEFT periaortic retroperitoneum with SUV max equal 12.8. No hypermetabolic inguinal nodes. Incidental CT findings: none SKELETON: There is new hypermetabolic activity within the central sacrum with SUV max equal 17.3 with extension into the LEFT and RIGHT sacral ala. Metastatic lesion also evident within the L4 vertebral body. Lesions are not well appreciated on CT image Incidental CT findings: none IMPRESSION: 1. Unfortunately there is widespread recurrence of intensely hypermetabolic multifocal lymphoma involving lymph nodes, liver, and bone marrow ([place]auville 5) 2. The pattern of recurrence is different than  the original FDG PET scan (05/22/2019) in that there is no involvement of the spleen and new involvement of the liver. 3. Additionally there are pulmonary nodules with varying degree of metabolic activity. These results will be called to the ordering clinician or representative by the Radiologist Assistant, and communication documented in the PACS or [employer]. Electronically Signed    By: Suzy Bouchard M.D.   On: 05/20/2020 13:27    ASSESSMENT AND PLAN:  This is a very pleasant 19 year old male diagnosed with Classical Hodgkin Lymphoma, Nodular Sclerosis subtype. He presented with bulky bilateral cervical lymphadenopathy, mediastinal lymphadenopathy, and mildly enlarged retroperitoneal periaortic adenopathy and iliac adenopathy. He also presented with involvement of the spleen. He was diagnosed in December 2020.  The patient is currently undergoing treatment with ABVD status post 6 cycles.  He noticed significant improvement in his symptoms after starting the chemotherapy. Repeat PET scan after cycle #2 showed marked improvement in the previously demonstrated hypermetabolic activity in the neck, chest, abdomen and pelvis with Deauville 2.  Bleomycin was discontinued after cycle #2 and the patient completed four more cycles with AVD and has been tolerating them fairly well.  He was supposed to have a follow-up visit with repeat PET scan after completion of cycle #6 in June 2021 but he was lost to follow-up and we were unable to get him back to the clinic for evaluation. Recently started having more symptoms with lymphadenopathy in the right side of the neck and the patient finally agreed to have a PET scan done and he is here today for evaluation and discussion of the scan results. Unfortunately the PET scan showed widespread recurrence of his disease involving the lymph node, liver as well as bone marrow and the pattern of this recurrence is different than the original finding on the PET scan of May 22, 2019.  There is no involvement of the spleen on this one and there was no new involvement of the liver. I had a lengthy discussion with the patient and his mentor today about his current condition and treatment options. I recommended for the patient referral to a tertiary center for second line treatment as well as consideration of stem cell transplant in the future. I will  refer him to Dr. Berton Bon at [facility] to treat him for his disease recurrence. I will see the patient on as-needed basis at this point. He was advised to call me immediately if he does not hear about the appointment from [facility] in the next 1-2 weeks. He was also advised to call immediately if he has any other concerning symptoms in the interval. The patient voices understanding of current disease status and treatment options and is in agreement with the current care plan.  All questions were answered. The patient knows to call the clinic with any problems, questions or concerns. We can certainly see the patient much sooner if necessary.  Disclaimer: This note was dictated with voice recognition software. Similar sounding words can inadvertently be transcribed and may not be corrected upon review.

## 2020-06-04 DIAGNOSIS — Z419 Encounter for procedure for purposes other than remedying health state, unspecified: Secondary | ICD-10-CM | POA: Diagnosis not present

## 2020-06-06 DIAGNOSIS — Z114 Encounter for screening for human immunodeficiency virus [HIV]: Secondary | ICD-10-CM | POA: Diagnosis not present

## 2020-06-06 DIAGNOSIS — C8190 Hodgkin lymphoma, unspecified, unspecified site: Secondary | ICD-10-CM | POA: Diagnosis not present

## 2020-06-06 DIAGNOSIS — Z1159 Encounter for screening for other viral diseases: Secondary | ICD-10-CM | POA: Diagnosis not present

## 2020-06-08 DIAGNOSIS — Z20822 Contact with and (suspected) exposure to covid-19: Secondary | ICD-10-CM | POA: Diagnosis not present

## 2020-06-08 DIAGNOSIS — R202 Paresthesia of skin: Secondary | ICD-10-CM | POA: Diagnosis not present

## 2020-06-08 DIAGNOSIS — R590 Localized enlarged lymph nodes: Secondary | ICD-10-CM | POA: Diagnosis not present

## 2020-06-08 DIAGNOSIS — Z01812 Encounter for preprocedural laboratory examination: Secondary | ICD-10-CM | POA: Diagnosis not present

## 2020-06-08 DIAGNOSIS — C8190 Hodgkin lymphoma, unspecified, unspecified site: Secondary | ICD-10-CM | POA: Diagnosis not present

## 2020-06-08 DIAGNOSIS — M545 Low back pain, unspecified: Secondary | ICD-10-CM | POA: Diagnosis not present

## 2020-06-13 DIAGNOSIS — C8111 Nodular sclerosis classical Hodgkin lymphoma, lymph nodes of head, face, and neck: Secondary | ICD-10-CM | POA: Diagnosis not present

## 2020-06-13 DIAGNOSIS — C8110 Nodular sclerosis classical Hodgkin lymphoma, unspecified site: Secondary | ICD-10-CM | POA: Diagnosis not present

## 2020-06-16 DIAGNOSIS — C8190 Hodgkin lymphoma, unspecified, unspecified site: Secondary | ICD-10-CM | POA: Diagnosis not present

## 2020-06-16 DIAGNOSIS — C412 Malignant neoplasm of vertebral column: Secondary | ICD-10-CM | POA: Diagnosis not present

## 2020-06-16 DIAGNOSIS — D649 Anemia, unspecified: Secondary | ICD-10-CM | POA: Diagnosis not present

## 2020-06-16 DIAGNOSIS — C8111 Nodular sclerosis classical Hodgkin lymphoma, lymph nodes of head, face, and neck: Secondary | ICD-10-CM | POA: Diagnosis not present

## 2020-06-16 DIAGNOSIS — D509 Iron deficiency anemia, unspecified: Secondary | ICD-10-CM | POA: Diagnosis not present

## 2020-06-16 DIAGNOSIS — C8110 Nodular sclerosis classical Hodgkin lymphoma, unspecified site: Secondary | ICD-10-CM | POA: Diagnosis not present

## 2020-06-16 DIAGNOSIS — R591 Generalized enlarged lymph nodes: Secondary | ICD-10-CM | POA: Diagnosis not present

## 2020-06-16 DIAGNOSIS — Z79899 Other long term (current) drug therapy: Secondary | ICD-10-CM | POA: Diagnosis not present

## 2020-06-17 DIAGNOSIS — Z5111 Encounter for antineoplastic chemotherapy: Secondary | ICD-10-CM | POA: Diagnosis not present

## 2020-06-17 DIAGNOSIS — C8111 Nodular sclerosis classical Hodgkin lymphoma, lymph nodes of head, face, and neck: Secondary | ICD-10-CM | POA: Diagnosis not present

## 2020-06-17 DIAGNOSIS — D509 Iron deficiency anemia, unspecified: Secondary | ICD-10-CM | POA: Diagnosis not present

## 2020-06-17 DIAGNOSIS — C412 Malignant neoplasm of vertebral column: Secondary | ICD-10-CM | POA: Diagnosis not present

## 2020-06-23 DIAGNOSIS — C8190 Hodgkin lymphoma, unspecified, unspecified site: Secondary | ICD-10-CM | POA: Diagnosis not present

## 2020-07-05 DIAGNOSIS — Z419 Encounter for procedure for purposes other than remedying health state, unspecified: Secondary | ICD-10-CM | POA: Diagnosis not present

## 2020-07-07 DIAGNOSIS — T451X5A Adverse effect of antineoplastic and immunosuppressive drugs, initial encounter: Secondary | ICD-10-CM | POA: Diagnosis not present

## 2020-07-07 DIAGNOSIS — C412 Malignant neoplasm of vertebral column: Secondary | ICD-10-CM | POA: Diagnosis not present

## 2020-07-07 DIAGNOSIS — Z79899 Other long term (current) drug therapy: Secondary | ICD-10-CM | POA: Diagnosis not present

## 2020-07-07 DIAGNOSIS — Y9289 Other specified places as the place of occurrence of the external cause: Secondary | ICD-10-CM | POA: Diagnosis not present

## 2020-07-07 DIAGNOSIS — L299 Pruritus, unspecified: Secondary | ICD-10-CM | POA: Diagnosis not present

## 2020-07-07 DIAGNOSIS — D509 Iron deficiency anemia, unspecified: Secondary | ICD-10-CM | POA: Diagnosis not present

## 2020-07-07 DIAGNOSIS — Z5111 Encounter for antineoplastic chemotherapy: Secondary | ICD-10-CM | POA: Diagnosis not present

## 2020-07-07 DIAGNOSIS — X58XXXA Exposure to other specified factors, initial encounter: Secondary | ICD-10-CM | POA: Diagnosis not present

## 2020-07-07 DIAGNOSIS — C8190 Hodgkin lymphoma, unspecified, unspecified site: Secondary | ICD-10-CM | POA: Diagnosis not present

## 2020-07-07 DIAGNOSIS — C8110 Nodular sclerosis classical Hodgkin lymphoma, unspecified site: Secondary | ICD-10-CM | POA: Diagnosis not present

## 2020-07-07 DIAGNOSIS — C8111 Nodular sclerosis classical Hodgkin lymphoma, lymph nodes of head, face, and neck: Secondary | ICD-10-CM | POA: Diagnosis not present

## 2020-07-08 DIAGNOSIS — C8111 Nodular sclerosis classical Hodgkin lymphoma, lymph nodes of head, face, and neck: Secondary | ICD-10-CM | POA: Diagnosis not present

## 2020-07-08 DIAGNOSIS — Z5111 Encounter for antineoplastic chemotherapy: Secondary | ICD-10-CM | POA: Diagnosis not present

## 2020-07-09 DIAGNOSIS — C8111 Nodular sclerosis classical Hodgkin lymphoma, lymph nodes of head, face, and neck: Secondary | ICD-10-CM | POA: Diagnosis not present

## 2020-07-09 DIAGNOSIS — Z5111 Encounter for antineoplastic chemotherapy: Secondary | ICD-10-CM | POA: Diagnosis not present

## 2020-07-25 ENCOUNTER — Ambulatory Visit: Payer: Medicaid Other | Admitting: Nurse Practitioner

## 2020-07-26 DIAGNOSIS — C8198 Hodgkin lymphoma, unspecified, lymph nodes of multiple sites: Secondary | ICD-10-CM | POA: Diagnosis not present

## 2020-07-26 DIAGNOSIS — K449 Diaphragmatic hernia without obstruction or gangrene: Secondary | ICD-10-CM | POA: Diagnosis not present

## 2020-07-28 DIAGNOSIS — T451X5D Adverse effect of antineoplastic and immunosuppressive drugs, subsequent encounter: Secondary | ICD-10-CM | POA: Diagnosis not present

## 2020-07-28 DIAGNOSIS — C8190 Hodgkin lymphoma, unspecified, unspecified site: Secondary | ICD-10-CM | POA: Diagnosis not present

## 2020-07-28 DIAGNOSIS — C8110 Nodular sclerosis classical Hodgkin lymphoma, unspecified site: Secondary | ICD-10-CM | POA: Diagnosis not present

## 2020-07-28 DIAGNOSIS — Z5111 Encounter for antineoplastic chemotherapy: Secondary | ICD-10-CM | POA: Diagnosis not present

## 2020-07-28 DIAGNOSIS — D509 Iron deficiency anemia, unspecified: Secondary | ICD-10-CM | POA: Diagnosis not present

## 2020-07-29 DIAGNOSIS — Z79899 Other long term (current) drug therapy: Secondary | ICD-10-CM | POA: Diagnosis not present

## 2020-07-29 DIAGNOSIS — C8190 Hodgkin lymphoma, unspecified, unspecified site: Secondary | ICD-10-CM | POA: Diagnosis not present

## 2020-07-29 DIAGNOSIS — Z5111 Encounter for antineoplastic chemotherapy: Secondary | ICD-10-CM | POA: Diagnosis not present

## 2020-08-02 DIAGNOSIS — Z419 Encounter for procedure for purposes other than remedying health state, unspecified: Secondary | ICD-10-CM | POA: Diagnosis not present

## 2020-08-08 DIAGNOSIS — Z5111 Encounter for antineoplastic chemotherapy: Secondary | ICD-10-CM | POA: Diagnosis not present

## 2020-08-08 DIAGNOSIS — C8118 Nodular sclerosis classical Hodgkin lymphoma, lymph nodes of multiple sites: Secondary | ICD-10-CM | POA: Diagnosis not present

## 2020-08-08 DIAGNOSIS — F419 Anxiety disorder, unspecified: Secondary | ICD-10-CM | POA: Diagnosis not present

## 2020-08-08 DIAGNOSIS — T8090XA Unspecified complication following infusion and therapeutic injection, initial encounter: Secondary | ICD-10-CM | POA: Diagnosis not present

## 2020-08-08 DIAGNOSIS — R112 Nausea with vomiting, unspecified: Secondary | ICD-10-CM | POA: Diagnosis not present

## 2020-08-08 DIAGNOSIS — L509 Urticaria, unspecified: Secondary | ICD-10-CM | POA: Diagnosis not present

## 2020-08-08 DIAGNOSIS — T451X5A Adverse effect of antineoplastic and immunosuppressive drugs, initial encounter: Secondary | ICD-10-CM | POA: Diagnosis not present

## 2020-08-08 DIAGNOSIS — C8590 Non-Hodgkin lymphoma, unspecified, unspecified site: Secondary | ICD-10-CM | POA: Diagnosis not present

## 2020-09-02 DIAGNOSIS — Z419 Encounter for procedure for purposes other than remedying health state, unspecified: Secondary | ICD-10-CM | POA: Diagnosis not present

## 2020-09-08 DIAGNOSIS — C8118 Nodular sclerosis classical Hodgkin lymphoma, lymph nodes of multiple sites: Secondary | ICD-10-CM | POA: Diagnosis not present

## 2020-09-08 DIAGNOSIS — C8110 Nodular sclerosis classical Hodgkin lymphoma, unspecified site: Secondary | ICD-10-CM | POA: Diagnosis not present

## 2020-09-12 DIAGNOSIS — D61810 Antineoplastic chemotherapy induced pancytopenia: Secondary | ICD-10-CM | POA: Diagnosis not present

## 2020-09-12 DIAGNOSIS — T451X5A Adverse effect of antineoplastic and immunosuppressive drugs, initial encounter: Secondary | ICD-10-CM | POA: Diagnosis not present

## 2020-09-12 DIAGNOSIS — I517 Cardiomegaly: Secondary | ICD-10-CM | POA: Diagnosis not present

## 2020-09-12 DIAGNOSIS — I351 Nonrheumatic aortic (valve) insufficiency: Secondary | ICD-10-CM | POA: Diagnosis not present

## 2020-09-12 DIAGNOSIS — F913 Oppositional defiant disorder: Secondary | ICD-10-CM | POA: Diagnosis not present

## 2020-09-12 DIAGNOSIS — Z79899 Other long term (current) drug therapy: Secondary | ICD-10-CM | POA: Diagnosis not present

## 2020-09-12 DIAGNOSIS — Z52011 Autologous donor, stem cells: Secondary | ICD-10-CM | POA: Diagnosis not present

## 2020-09-12 DIAGNOSIS — R112 Nausea with vomiting, unspecified: Secondary | ICD-10-CM | POA: Diagnosis not present

## 2020-09-12 DIAGNOSIS — Z01818 Encounter for other preprocedural examination: Secondary | ICD-10-CM | POA: Diagnosis not present

## 2020-09-12 DIAGNOSIS — F419 Anxiety disorder, unspecified: Secondary | ICD-10-CM | POA: Diagnosis not present

## 2020-09-12 DIAGNOSIS — C8110 Nodular sclerosis classical Hodgkin lymphoma, unspecified site: Secondary | ICD-10-CM | POA: Diagnosis not present

## 2020-09-12 DIAGNOSIS — Z7689 Persons encountering health services in other specified circumstances: Secondary | ICD-10-CM | POA: Diagnosis not present

## 2020-09-14 DIAGNOSIS — Z01818 Encounter for other preprocedural examination: Secondary | ICD-10-CM | POA: Diagnosis not present

## 2020-09-14 DIAGNOSIS — C8110 Nodular sclerosis classical Hodgkin lymphoma, unspecified site: Secondary | ICD-10-CM | POA: Diagnosis not present

## 2020-09-22 DIAGNOSIS — Z7689 Persons encountering health services in other specified circumstances: Secondary | ICD-10-CM | POA: Diagnosis not present

## 2020-09-22 DIAGNOSIS — C8110 Nodular sclerosis classical Hodgkin lymphoma, unspecified site: Secondary | ICD-10-CM | POA: Diagnosis not present

## 2020-09-22 DIAGNOSIS — Z01818 Encounter for other preprocedural examination: Secondary | ICD-10-CM | POA: Diagnosis not present

## 2020-09-26 DIAGNOSIS — Z7689 Persons encountering health services in other specified circumstances: Secondary | ICD-10-CM | POA: Diagnosis not present

## 2020-09-26 DIAGNOSIS — C8110 Nodular sclerosis classical Hodgkin lymphoma, unspecified site: Secondary | ICD-10-CM | POA: Diagnosis not present

## 2020-09-26 DIAGNOSIS — Z452 Encounter for adjustment and management of vascular access device: Secondary | ICD-10-CM | POA: Diagnosis not present

## 2020-09-26 DIAGNOSIS — Z52011 Autologous donor, stem cells: Secondary | ICD-10-CM | POA: Diagnosis not present

## 2020-09-27 DIAGNOSIS — Z52011 Autologous donor, stem cells: Secondary | ICD-10-CM | POA: Diagnosis not present

## 2020-09-27 DIAGNOSIS — C8110 Nodular sclerosis classical Hodgkin lymphoma, unspecified site: Secondary | ICD-10-CM | POA: Diagnosis not present

## 2020-09-29 DIAGNOSIS — Z7689 Persons encountering health services in other specified circumstances: Secondary | ICD-10-CM | POA: Diagnosis not present

## 2020-10-02 DIAGNOSIS — Z419 Encounter for procedure for purposes other than remedying health state, unspecified: Secondary | ICD-10-CM | POA: Diagnosis not present

## 2020-10-03 DIAGNOSIS — Z20822 Contact with and (suspected) exposure to covid-19: Secondary | ICD-10-CM | POA: Diagnosis not present

## 2020-10-03 DIAGNOSIS — Z01812 Encounter for preprocedural laboratory examination: Secondary | ICD-10-CM | POA: Diagnosis not present

## 2020-10-03 DIAGNOSIS — C8110 Nodular sclerosis classical Hodgkin lymphoma, unspecified site: Secondary | ICD-10-CM | POA: Diagnosis not present

## 2020-10-05 DIAGNOSIS — D630 Anemia in neoplastic disease: Secondary | ICD-10-CM | POA: Diagnosis not present

## 2020-10-05 DIAGNOSIS — Z5111 Encounter for antineoplastic chemotherapy: Secondary | ICD-10-CM | POA: Diagnosis not present

## 2020-10-05 DIAGNOSIS — C8110 Nodular sclerosis classical Hodgkin lymphoma, unspecified site: Secondary | ICD-10-CM | POA: Diagnosis not present

## 2020-10-05 DIAGNOSIS — D61810 Antineoplastic chemotherapy induced pancytopenia: Secondary | ICD-10-CM | POA: Diagnosis not present

## 2020-10-05 DIAGNOSIS — R112 Nausea with vomiting, unspecified: Secondary | ICD-10-CM | POA: Diagnosis not present

## 2020-10-05 DIAGNOSIS — D6481 Anemia due to antineoplastic chemotherapy: Secondary | ICD-10-CM | POA: Diagnosis not present

## 2020-10-05 DIAGNOSIS — D509 Iron deficiency anemia, unspecified: Secondary | ICD-10-CM | POA: Diagnosis not present

## 2020-10-05 DIAGNOSIS — T451X5A Adverse effect of antineoplastic and immunosuppressive drugs, initial encounter: Secondary | ICD-10-CM | POA: Diagnosis not present

## 2020-10-05 DIAGNOSIS — Z52011 Autologous donor, stem cells: Secondary | ICD-10-CM | POA: Diagnosis not present

## 2020-10-05 DIAGNOSIS — Z79899 Other long term (current) drug therapy: Secondary | ICD-10-CM | POA: Diagnosis not present

## 2020-10-06 DIAGNOSIS — D61810 Antineoplastic chemotherapy induced pancytopenia: Secondary | ICD-10-CM | POA: Diagnosis not present

## 2020-10-06 DIAGNOSIS — Z01812 Encounter for preprocedural laboratory examination: Secondary | ICD-10-CM | POA: Diagnosis not present

## 2020-10-06 DIAGNOSIS — C8110 Nodular sclerosis classical Hodgkin lymphoma, unspecified site: Secondary | ICD-10-CM | POA: Diagnosis not present

## 2020-10-06 DIAGNOSIS — D6481 Anemia due to antineoplastic chemotherapy: Secondary | ICD-10-CM | POA: Diagnosis not present

## 2020-10-06 DIAGNOSIS — F913 Oppositional defiant disorder: Secondary | ICD-10-CM | POA: Diagnosis not present

## 2020-10-06 DIAGNOSIS — R110 Nausea: Secondary | ICD-10-CM | POA: Diagnosis not present

## 2020-10-06 DIAGNOSIS — T451X5A Adverse effect of antineoplastic and immunosuppressive drugs, initial encounter: Secondary | ICD-10-CM | POA: Diagnosis not present

## 2020-10-06 DIAGNOSIS — R112 Nausea with vomiting, unspecified: Secondary | ICD-10-CM | POA: Diagnosis not present

## 2020-10-06 DIAGNOSIS — D509 Iron deficiency anemia, unspecified: Secondary | ICD-10-CM | POA: Diagnosis not present

## 2020-10-06 DIAGNOSIS — Z5111 Encounter for antineoplastic chemotherapy: Secondary | ICD-10-CM | POA: Diagnosis not present

## 2020-10-06 DIAGNOSIS — Z79899 Other long term (current) drug therapy: Secondary | ICD-10-CM | POA: Diagnosis not present

## 2020-10-06 DIAGNOSIS — F329 Major depressive disorder, single episode, unspecified: Secondary | ICD-10-CM | POA: Diagnosis not present

## 2020-10-06 DIAGNOSIS — Z52011 Autologous donor, stem cells: Secondary | ICD-10-CM | POA: Diagnosis not present

## 2020-10-07 DIAGNOSIS — Z5111 Encounter for antineoplastic chemotherapy: Secondary | ICD-10-CM | POA: Diagnosis not present

## 2020-10-07 DIAGNOSIS — D509 Iron deficiency anemia, unspecified: Secondary | ICD-10-CM | POA: Diagnosis not present

## 2020-10-07 DIAGNOSIS — C8110 Nodular sclerosis classical Hodgkin lymphoma, unspecified site: Secondary | ICD-10-CM | POA: Diagnosis not present

## 2020-10-07 DIAGNOSIS — D6481 Anemia due to antineoplastic chemotherapy: Secondary | ICD-10-CM | POA: Diagnosis not present

## 2020-10-07 DIAGNOSIS — R110 Nausea: Secondary | ICD-10-CM | POA: Diagnosis not present

## 2020-10-07 DIAGNOSIS — T451X5A Adverse effect of antineoplastic and immunosuppressive drugs, initial encounter: Secondary | ICD-10-CM | POA: Diagnosis not present

## 2020-10-08 DIAGNOSIS — C8110 Nodular sclerosis classical Hodgkin lymphoma, unspecified site: Secondary | ICD-10-CM | POA: Diagnosis not present

## 2020-10-08 DIAGNOSIS — R110 Nausea: Secondary | ICD-10-CM | POA: Diagnosis not present

## 2020-10-08 DIAGNOSIS — D6481 Anemia due to antineoplastic chemotherapy: Secondary | ICD-10-CM | POA: Diagnosis not present

## 2020-10-08 DIAGNOSIS — R112 Nausea with vomiting, unspecified: Secondary | ICD-10-CM | POA: Diagnosis not present

## 2020-10-08 DIAGNOSIS — D61810 Antineoplastic chemotherapy induced pancytopenia: Secondary | ICD-10-CM | POA: Diagnosis not present

## 2020-10-08 DIAGNOSIS — F419 Anxiety disorder, unspecified: Secondary | ICD-10-CM | POA: Diagnosis not present

## 2020-10-08 DIAGNOSIS — Z5111 Encounter for antineoplastic chemotherapy: Secondary | ICD-10-CM | POA: Diagnosis not present

## 2020-10-08 DIAGNOSIS — F329 Major depressive disorder, single episode, unspecified: Secondary | ICD-10-CM | POA: Diagnosis not present

## 2020-10-08 DIAGNOSIS — D509 Iron deficiency anemia, unspecified: Secondary | ICD-10-CM | POA: Diagnosis not present

## 2020-10-08 DIAGNOSIS — Z79899 Other long term (current) drug therapy: Secondary | ICD-10-CM | POA: Diagnosis not present

## 2020-10-08 DIAGNOSIS — Z52011 Autologous donor, stem cells: Secondary | ICD-10-CM | POA: Diagnosis not present

## 2020-10-08 DIAGNOSIS — F913 Oppositional defiant disorder: Secondary | ICD-10-CM | POA: Diagnosis not present

## 2020-10-08 DIAGNOSIS — T451X5A Adverse effect of antineoplastic and immunosuppressive drugs, initial encounter: Secondary | ICD-10-CM | POA: Diagnosis not present

## 2020-10-09 DIAGNOSIS — C8110 Nodular sclerosis classical Hodgkin lymphoma, unspecified site: Secondary | ICD-10-CM | POA: Diagnosis not present

## 2020-10-09 DIAGNOSIS — D6481 Anemia due to antineoplastic chemotherapy: Secondary | ICD-10-CM | POA: Diagnosis not present

## 2020-10-09 DIAGNOSIS — Z79899 Other long term (current) drug therapy: Secondary | ICD-10-CM | POA: Diagnosis not present

## 2020-10-09 DIAGNOSIS — T451X5A Adverse effect of antineoplastic and immunosuppressive drugs, initial encounter: Secondary | ICD-10-CM | POA: Diagnosis not present

## 2020-10-09 DIAGNOSIS — R112 Nausea with vomiting, unspecified: Secondary | ICD-10-CM | POA: Diagnosis not present

## 2020-10-09 DIAGNOSIS — F419 Anxiety disorder, unspecified: Secondary | ICD-10-CM | POA: Diagnosis not present

## 2020-10-09 DIAGNOSIS — C8118 Nodular sclerosis classical Hodgkin lymphoma, lymph nodes of multiple sites: Secondary | ICD-10-CM | POA: Diagnosis not present

## 2020-10-09 DIAGNOSIS — Z52011 Autologous donor, stem cells: Secondary | ICD-10-CM | POA: Diagnosis not present

## 2020-10-09 DIAGNOSIS — D61810 Antineoplastic chemotherapy induced pancytopenia: Secondary | ICD-10-CM | POA: Diagnosis not present

## 2020-10-09 DIAGNOSIS — D509 Iron deficiency anemia, unspecified: Secondary | ICD-10-CM | POA: Diagnosis not present

## 2020-10-09 DIAGNOSIS — F913 Oppositional defiant disorder: Secondary | ICD-10-CM | POA: Diagnosis not present

## 2020-10-09 DIAGNOSIS — Z5111 Encounter for antineoplastic chemotherapy: Secondary | ICD-10-CM | POA: Diagnosis not present

## 2020-10-10 DIAGNOSIS — R112 Nausea with vomiting, unspecified: Secondary | ICD-10-CM | POA: Diagnosis not present

## 2020-10-10 DIAGNOSIS — Z52011 Autologous donor, stem cells: Secondary | ICD-10-CM | POA: Diagnosis not present

## 2020-10-10 DIAGNOSIS — Z79899 Other long term (current) drug therapy: Secondary | ICD-10-CM | POA: Diagnosis not present

## 2020-10-10 DIAGNOSIS — D61810 Antineoplastic chemotherapy induced pancytopenia: Secondary | ICD-10-CM | POA: Diagnosis not present

## 2020-10-10 DIAGNOSIS — T451X5A Adverse effect of antineoplastic and immunosuppressive drugs, initial encounter: Secondary | ICD-10-CM | POA: Diagnosis not present

## 2020-10-10 DIAGNOSIS — C8110 Nodular sclerosis classical Hodgkin lymphoma, unspecified site: Secondary | ICD-10-CM | POA: Diagnosis not present

## 2020-10-10 DIAGNOSIS — F913 Oppositional defiant disorder: Secondary | ICD-10-CM | POA: Diagnosis not present

## 2020-10-10 DIAGNOSIS — F419 Anxiety disorder, unspecified: Secondary | ICD-10-CM | POA: Diagnosis not present

## 2020-10-11 DIAGNOSIS — R112 Nausea with vomiting, unspecified: Secondary | ICD-10-CM | POA: Diagnosis not present

## 2020-10-11 DIAGNOSIS — T451X5A Adverse effect of antineoplastic and immunosuppressive drugs, initial encounter: Secondary | ICD-10-CM | POA: Diagnosis not present

## 2020-10-11 DIAGNOSIS — D61810 Antineoplastic chemotherapy induced pancytopenia: Secondary | ICD-10-CM | POA: Diagnosis not present

## 2020-10-11 DIAGNOSIS — F419 Anxiety disorder, unspecified: Secondary | ICD-10-CM | POA: Diagnosis not present

## 2020-10-11 DIAGNOSIS — Z79899 Other long term (current) drug therapy: Secondary | ICD-10-CM | POA: Diagnosis not present

## 2020-10-11 DIAGNOSIS — Z52011 Autologous donor, stem cells: Secondary | ICD-10-CM | POA: Diagnosis not present

## 2020-10-11 DIAGNOSIS — D509 Iron deficiency anemia, unspecified: Secondary | ICD-10-CM | POA: Diagnosis not present

## 2020-10-11 DIAGNOSIS — C8110 Nodular sclerosis classical Hodgkin lymphoma, unspecified site: Secondary | ICD-10-CM | POA: Diagnosis not present

## 2020-10-11 DIAGNOSIS — F913 Oppositional defiant disorder: Secondary | ICD-10-CM | POA: Diagnosis not present

## 2020-10-11 DIAGNOSIS — Z9484 Stem cells transplant status: Secondary | ICD-10-CM | POA: Diagnosis not present

## 2020-10-11 DIAGNOSIS — D6481 Anemia due to antineoplastic chemotherapy: Secondary | ICD-10-CM | POA: Diagnosis not present

## 2020-10-12 DIAGNOSIS — Z79899 Other long term (current) drug therapy: Secondary | ICD-10-CM | POA: Diagnosis not present

## 2020-10-12 DIAGNOSIS — Z52011 Autologous donor, stem cells: Secondary | ICD-10-CM | POA: Diagnosis not present

## 2020-10-12 DIAGNOSIS — R197 Diarrhea, unspecified: Secondary | ICD-10-CM | POA: Diagnosis not present

## 2020-10-12 DIAGNOSIS — Z792 Long term (current) use of antibiotics: Secondary | ICD-10-CM | POA: Diagnosis not present

## 2020-10-12 DIAGNOSIS — T451X5A Adverse effect of antineoplastic and immunosuppressive drugs, initial encounter: Secondary | ICD-10-CM | POA: Diagnosis not present

## 2020-10-12 DIAGNOSIS — F419 Anxiety disorder, unspecified: Secondary | ICD-10-CM | POA: Diagnosis not present

## 2020-10-12 DIAGNOSIS — F913 Oppositional defiant disorder: Secondary | ICD-10-CM | POA: Diagnosis not present

## 2020-10-12 DIAGNOSIS — D61810 Antineoplastic chemotherapy induced pancytopenia: Secondary | ICD-10-CM | POA: Diagnosis not present

## 2020-10-12 DIAGNOSIS — D509 Iron deficiency anemia, unspecified: Secondary | ICD-10-CM | POA: Diagnosis not present

## 2020-10-12 DIAGNOSIS — L02214 Cutaneous abscess of groin: Secondary | ICD-10-CM | POA: Diagnosis not present

## 2020-10-12 DIAGNOSIS — R112 Nausea with vomiting, unspecified: Secondary | ICD-10-CM | POA: Diagnosis not present

## 2020-10-12 DIAGNOSIS — C8110 Nodular sclerosis classical Hodgkin lymphoma, unspecified site: Secondary | ICD-10-CM | POA: Diagnosis not present

## 2020-10-12 DIAGNOSIS — Z95828 Presence of other vascular implants and grafts: Secondary | ICD-10-CM | POA: Diagnosis not present

## 2020-10-13 DIAGNOSIS — R112 Nausea with vomiting, unspecified: Secondary | ICD-10-CM | POA: Diagnosis not present

## 2020-10-13 DIAGNOSIS — C8110 Nodular sclerosis classical Hodgkin lymphoma, unspecified site: Secondary | ICD-10-CM | POA: Diagnosis not present

## 2020-10-13 DIAGNOSIS — Z95828 Presence of other vascular implants and grafts: Secondary | ICD-10-CM | POA: Diagnosis not present

## 2020-10-13 DIAGNOSIS — F913 Oppositional defiant disorder: Secondary | ICD-10-CM | POA: Diagnosis not present

## 2020-10-13 DIAGNOSIS — T451X5A Adverse effect of antineoplastic and immunosuppressive drugs, initial encounter: Secondary | ICD-10-CM | POA: Diagnosis not present

## 2020-10-13 DIAGNOSIS — Z792 Long term (current) use of antibiotics: Secondary | ICD-10-CM | POA: Diagnosis not present

## 2020-10-13 DIAGNOSIS — Z79899 Other long term (current) drug therapy: Secondary | ICD-10-CM | POA: Diagnosis not present

## 2020-10-13 DIAGNOSIS — D509 Iron deficiency anemia, unspecified: Secondary | ICD-10-CM | POA: Diagnosis not present

## 2020-10-13 DIAGNOSIS — F419 Anxiety disorder, unspecified: Secondary | ICD-10-CM | POA: Diagnosis not present

## 2020-10-13 DIAGNOSIS — Z52011 Autologous donor, stem cells: Secondary | ICD-10-CM | POA: Diagnosis not present

## 2020-10-13 DIAGNOSIS — D61810 Antineoplastic chemotherapy induced pancytopenia: Secondary | ICD-10-CM | POA: Diagnosis not present

## 2020-10-14 DIAGNOSIS — Z9484 Stem cells transplant status: Secondary | ICD-10-CM | POA: Diagnosis not present

## 2020-10-14 DIAGNOSIS — F419 Anxiety disorder, unspecified: Secondary | ICD-10-CM | POA: Diagnosis not present

## 2020-10-14 DIAGNOSIS — C8110 Nodular sclerosis classical Hodgkin lymphoma, unspecified site: Secondary | ICD-10-CM | POA: Diagnosis not present

## 2020-10-14 DIAGNOSIS — L02214 Cutaneous abscess of groin: Secondary | ICD-10-CM | POA: Diagnosis not present

## 2020-10-14 DIAGNOSIS — Z95828 Presence of other vascular implants and grafts: Secondary | ICD-10-CM | POA: Diagnosis not present

## 2020-10-14 DIAGNOSIS — R112 Nausea with vomiting, unspecified: Secondary | ICD-10-CM | POA: Diagnosis not present

## 2020-10-14 DIAGNOSIS — Z52011 Autologous donor, stem cells: Secondary | ICD-10-CM | POA: Diagnosis not present

## 2020-10-14 DIAGNOSIS — R197 Diarrhea, unspecified: Secondary | ICD-10-CM | POA: Diagnosis not present

## 2020-10-14 DIAGNOSIS — Z79899 Other long term (current) drug therapy: Secondary | ICD-10-CM | POA: Diagnosis not present

## 2020-10-14 DIAGNOSIS — D61810 Antineoplastic chemotherapy induced pancytopenia: Secondary | ICD-10-CM | POA: Diagnosis not present

## 2020-10-14 DIAGNOSIS — F913 Oppositional defiant disorder: Secondary | ICD-10-CM | POA: Diagnosis not present

## 2020-10-14 DIAGNOSIS — D509 Iron deficiency anemia, unspecified: Secondary | ICD-10-CM | POA: Diagnosis not present

## 2020-10-14 DIAGNOSIS — T451X5A Adverse effect of antineoplastic and immunosuppressive drugs, initial encounter: Secondary | ICD-10-CM | POA: Diagnosis not present

## 2020-10-14 DIAGNOSIS — Z792 Long term (current) use of antibiotics: Secondary | ICD-10-CM | POA: Diagnosis not present

## 2020-10-15 DIAGNOSIS — K209 Esophagitis, unspecified without bleeding: Secondary | ICD-10-CM | POA: Diagnosis not present

## 2020-10-15 DIAGNOSIS — D509 Iron deficiency anemia, unspecified: Secondary | ICD-10-CM | POA: Diagnosis not present

## 2020-10-15 DIAGNOSIS — R112 Nausea with vomiting, unspecified: Secondary | ICD-10-CM | POA: Diagnosis not present

## 2020-10-15 DIAGNOSIS — L02214 Cutaneous abscess of groin: Secondary | ICD-10-CM | POA: Diagnosis not present

## 2020-10-15 DIAGNOSIS — C8110 Nodular sclerosis classical Hodgkin lymphoma, unspecified site: Secondary | ICD-10-CM | POA: Diagnosis not present

## 2020-10-15 DIAGNOSIS — D61810 Antineoplastic chemotherapy induced pancytopenia: Secondary | ICD-10-CM | POA: Diagnosis not present

## 2020-10-15 DIAGNOSIS — Z52011 Autologous donor, stem cells: Secondary | ICD-10-CM | POA: Diagnosis not present

## 2020-10-15 DIAGNOSIS — D6481 Anemia due to antineoplastic chemotherapy: Secondary | ICD-10-CM | POA: Diagnosis not present

## 2020-10-15 DIAGNOSIS — C8118 Nodular sclerosis classical Hodgkin lymphoma, lymph nodes of multiple sites: Secondary | ICD-10-CM | POA: Diagnosis not present

## 2020-10-15 DIAGNOSIS — K1230 Oral mucositis (ulcerative), unspecified: Secondary | ICD-10-CM | POA: Diagnosis not present

## 2020-10-15 DIAGNOSIS — R197 Diarrhea, unspecified: Secondary | ICD-10-CM | POA: Diagnosis not present

## 2020-10-15 DIAGNOSIS — F419 Anxiety disorder, unspecified: Secondary | ICD-10-CM | POA: Diagnosis not present

## 2020-10-15 DIAGNOSIS — Z95828 Presence of other vascular implants and grafts: Secondary | ICD-10-CM | POA: Diagnosis not present

## 2020-10-15 DIAGNOSIS — Z9484 Stem cells transplant status: Secondary | ICD-10-CM | POA: Diagnosis not present

## 2020-10-15 DIAGNOSIS — T451X5A Adverse effect of antineoplastic and immunosuppressive drugs, initial encounter: Secondary | ICD-10-CM | POA: Diagnosis not present

## 2020-10-15 DIAGNOSIS — F913 Oppositional defiant disorder: Secondary | ICD-10-CM | POA: Diagnosis not present

## 2020-10-16 DIAGNOSIS — Z52011 Autologous donor, stem cells: Secondary | ICD-10-CM | POA: Diagnosis not present

## 2020-10-16 DIAGNOSIS — R112 Nausea with vomiting, unspecified: Secondary | ICD-10-CM | POA: Diagnosis not present

## 2020-10-16 DIAGNOSIS — T451X5A Adverse effect of antineoplastic and immunosuppressive drugs, initial encounter: Secondary | ICD-10-CM | POA: Diagnosis not present

## 2020-10-16 DIAGNOSIS — Z79899 Other long term (current) drug therapy: Secondary | ICD-10-CM | POA: Diagnosis not present

## 2020-10-16 DIAGNOSIS — C8110 Nodular sclerosis classical Hodgkin lymphoma, unspecified site: Secondary | ICD-10-CM | POA: Diagnosis not present

## 2020-10-16 DIAGNOSIS — Z95828 Presence of other vascular implants and grafts: Secondary | ICD-10-CM | POA: Diagnosis not present

## 2020-10-16 DIAGNOSIS — R109 Unspecified abdominal pain: Secondary | ICD-10-CM | POA: Diagnosis not present

## 2020-10-16 DIAGNOSIS — R197 Diarrhea, unspecified: Secondary | ICD-10-CM | POA: Diagnosis not present

## 2020-10-16 DIAGNOSIS — Z940 Kidney transplant status: Secondary | ICD-10-CM | POA: Diagnosis not present

## 2020-10-16 DIAGNOSIS — Z792 Long term (current) use of antibiotics: Secondary | ICD-10-CM | POA: Diagnosis not present

## 2020-10-17 DIAGNOSIS — K529 Noninfective gastroenteritis and colitis, unspecified: Secondary | ICD-10-CM | POA: Diagnosis not present

## 2020-10-17 DIAGNOSIS — L02214 Cutaneous abscess of groin: Secondary | ICD-10-CM | POA: Diagnosis not present

## 2020-10-17 DIAGNOSIS — T451X5A Adverse effect of antineoplastic and immunosuppressive drugs, initial encounter: Secondary | ICD-10-CM | POA: Diagnosis not present

## 2020-10-17 DIAGNOSIS — Z9484 Stem cells transplant status: Secondary | ICD-10-CM | POA: Diagnosis not present

## 2020-10-17 DIAGNOSIS — Z79899 Other long term (current) drug therapy: Secondary | ICD-10-CM | POA: Diagnosis not present

## 2020-10-17 DIAGNOSIS — C8110 Nodular sclerosis classical Hodgkin lymphoma, unspecified site: Secondary | ICD-10-CM | POA: Diagnosis not present

## 2020-10-17 DIAGNOSIS — R197 Diarrhea, unspecified: Secondary | ICD-10-CM | POA: Diagnosis not present

## 2020-10-17 DIAGNOSIS — Z792 Long term (current) use of antibiotics: Secondary | ICD-10-CM | POA: Diagnosis not present

## 2020-10-17 DIAGNOSIS — R109 Unspecified abdominal pain: Secondary | ICD-10-CM | POA: Diagnosis not present

## 2020-10-17 DIAGNOSIS — Z940 Kidney transplant status: Secondary | ICD-10-CM | POA: Diagnosis not present

## 2020-10-17 DIAGNOSIS — D6481 Anemia due to antineoplastic chemotherapy: Secondary | ICD-10-CM | POA: Diagnosis not present

## 2020-10-17 DIAGNOSIS — Z95828 Presence of other vascular implants and grafts: Secondary | ICD-10-CM | POA: Diagnosis not present

## 2020-10-17 DIAGNOSIS — D509 Iron deficiency anemia, unspecified: Secondary | ICD-10-CM | POA: Diagnosis not present

## 2020-10-17 DIAGNOSIS — K1230 Oral mucositis (ulcerative), unspecified: Secondary | ICD-10-CM | POA: Diagnosis not present

## 2020-10-17 DIAGNOSIS — C8118 Nodular sclerosis classical Hodgkin lymphoma, lymph nodes of multiple sites: Secondary | ICD-10-CM | POA: Diagnosis not present

## 2020-10-17 DIAGNOSIS — R112 Nausea with vomiting, unspecified: Secondary | ICD-10-CM | POA: Diagnosis not present

## 2020-10-17 DIAGNOSIS — K209 Esophagitis, unspecified without bleeding: Secondary | ICD-10-CM | POA: Diagnosis not present

## 2020-10-18 DIAGNOSIS — K209 Esophagitis, unspecified without bleeding: Secondary | ICD-10-CM | POA: Diagnosis not present

## 2020-10-18 DIAGNOSIS — Z79899 Other long term (current) drug therapy: Secondary | ICD-10-CM | POA: Diagnosis not present

## 2020-10-18 DIAGNOSIS — Z9484 Stem cells transplant status: Secondary | ICD-10-CM | POA: Diagnosis not present

## 2020-10-18 DIAGNOSIS — D6481 Anemia due to antineoplastic chemotherapy: Secondary | ICD-10-CM | POA: Diagnosis not present

## 2020-10-18 DIAGNOSIS — C8118 Nodular sclerosis classical Hodgkin lymphoma, lymph nodes of multiple sites: Secondary | ICD-10-CM | POA: Diagnosis not present

## 2020-10-18 DIAGNOSIS — K529 Noninfective gastroenteritis and colitis, unspecified: Secondary | ICD-10-CM | POA: Diagnosis not present

## 2020-10-18 DIAGNOSIS — D61810 Antineoplastic chemotherapy induced pancytopenia: Secondary | ICD-10-CM | POA: Diagnosis not present

## 2020-10-18 DIAGNOSIS — D509 Iron deficiency anemia, unspecified: Secondary | ICD-10-CM | POA: Diagnosis not present

## 2020-10-18 DIAGNOSIS — K1230 Oral mucositis (ulcerative), unspecified: Secondary | ICD-10-CM | POA: Diagnosis not present

## 2020-10-18 DIAGNOSIS — R197 Diarrhea, unspecified: Secondary | ICD-10-CM | POA: Diagnosis not present

## 2020-10-18 DIAGNOSIS — Z792 Long term (current) use of antibiotics: Secondary | ICD-10-CM | POA: Diagnosis not present

## 2020-10-18 DIAGNOSIS — C8110 Nodular sclerosis classical Hodgkin lymphoma, unspecified site: Secondary | ICD-10-CM | POA: Diagnosis not present

## 2020-10-18 DIAGNOSIS — T451X5A Adverse effect of antineoplastic and immunosuppressive drugs, initial encounter: Secondary | ICD-10-CM | POA: Diagnosis not present

## 2020-10-18 DIAGNOSIS — Z95828 Presence of other vascular implants and grafts: Secondary | ICD-10-CM | POA: Diagnosis not present

## 2020-10-18 DIAGNOSIS — R112 Nausea with vomiting, unspecified: Secondary | ICD-10-CM | POA: Diagnosis not present

## 2020-10-18 DIAGNOSIS — L02214 Cutaneous abscess of groin: Secondary | ICD-10-CM | POA: Diagnosis not present

## 2020-10-19 ENCOUNTER — Telehealth: Payer: Self-pay | Admitting: Internal Medicine

## 2020-10-19 DIAGNOSIS — L02214 Cutaneous abscess of groin: Secondary | ICD-10-CM | POA: Diagnosis not present

## 2020-10-19 DIAGNOSIS — F913 Oppositional defiant disorder: Secondary | ICD-10-CM | POA: Diagnosis not present

## 2020-10-19 DIAGNOSIS — D509 Iron deficiency anemia, unspecified: Secondary | ICD-10-CM | POA: Diagnosis not present

## 2020-10-19 DIAGNOSIS — D6481 Anemia due to antineoplastic chemotherapy: Secondary | ICD-10-CM | POA: Diagnosis not present

## 2020-10-19 DIAGNOSIS — C8118 Nodular sclerosis classical Hodgkin lymphoma, lymph nodes of multiple sites: Secondary | ICD-10-CM | POA: Diagnosis not present

## 2020-10-19 DIAGNOSIS — T451X5A Adverse effect of antineoplastic and immunosuppressive drugs, initial encounter: Secondary | ICD-10-CM | POA: Diagnosis not present

## 2020-10-19 DIAGNOSIS — Z9484 Stem cells transplant status: Secondary | ICD-10-CM | POA: Diagnosis not present

## 2020-10-19 DIAGNOSIS — R112 Nausea with vomiting, unspecified: Secondary | ICD-10-CM | POA: Diagnosis not present

## 2020-10-19 DIAGNOSIS — K529 Noninfective gastroenteritis and colitis, unspecified: Secondary | ICD-10-CM | POA: Diagnosis not present

## 2020-10-19 NOTE — Telephone Encounter (Signed)
Elmyra Ricks from [facility] called to schedule appt 60 days post transplate with Dr. Julien Nordmann. Appt scheduled and she said she would notify pt.

## 2020-10-20 DIAGNOSIS — R197 Diarrhea, unspecified: Secondary | ICD-10-CM | POA: Diagnosis not present

## 2020-10-20 DIAGNOSIS — C8110 Nodular sclerosis classical Hodgkin lymphoma, unspecified site: Secondary | ICD-10-CM | POA: Diagnosis not present

## 2020-10-20 DIAGNOSIS — D61810 Antineoplastic chemotherapy induced pancytopenia: Secondary | ICD-10-CM | POA: Diagnosis not present

## 2020-10-20 DIAGNOSIS — F913 Oppositional defiant disorder: Secondary | ICD-10-CM | POA: Diagnosis not present

## 2020-10-20 DIAGNOSIS — T451X5A Adverse effect of antineoplastic and immunosuppressive drugs, initial encounter: Secondary | ICD-10-CM | POA: Diagnosis not present

## 2020-10-20 DIAGNOSIS — R112 Nausea with vomiting, unspecified: Secondary | ICD-10-CM | POA: Diagnosis not present

## 2020-10-20 DIAGNOSIS — F329 Major depressive disorder, single episode, unspecified: Secondary | ICD-10-CM | POA: Diagnosis not present

## 2020-10-20 DIAGNOSIS — Z9484 Stem cells transplant status: Secondary | ICD-10-CM | POA: Diagnosis not present

## 2020-10-20 DIAGNOSIS — L02214 Cutaneous abscess of groin: Secondary | ICD-10-CM | POA: Diagnosis not present

## 2020-10-20 DIAGNOSIS — Z79899 Other long term (current) drug therapy: Secondary | ICD-10-CM | POA: Diagnosis not present

## 2020-10-20 DIAGNOSIS — Z95828 Presence of other vascular implants and grafts: Secondary | ICD-10-CM | POA: Diagnosis not present

## 2020-10-21 DIAGNOSIS — C8190 Hodgkin lymphoma, unspecified, unspecified site: Secondary | ICD-10-CM | POA: Diagnosis not present

## 2020-10-21 DIAGNOSIS — C8110 Nodular sclerosis classical Hodgkin lymphoma, unspecified site: Secondary | ICD-10-CM | POA: Diagnosis not present

## 2020-10-21 DIAGNOSIS — Z9484 Stem cells transplant status: Secondary | ICD-10-CM | POA: Diagnosis not present

## 2020-10-22 DIAGNOSIS — C8110 Nodular sclerosis classical Hodgkin lymphoma, unspecified site: Secondary | ICD-10-CM | POA: Diagnosis not present

## 2020-10-22 DIAGNOSIS — T451X5D Adverse effect of antineoplastic and immunosuppressive drugs, subsequent encounter: Secondary | ICD-10-CM | POA: Diagnosis not present

## 2020-10-22 DIAGNOSIS — D759 Disease of blood and blood-forming organs, unspecified: Secondary | ICD-10-CM | POA: Diagnosis not present

## 2020-10-22 DIAGNOSIS — C8118 Nodular sclerosis classical Hodgkin lymphoma, lymph nodes of multiple sites: Secondary | ICD-10-CM | POA: Diagnosis not present

## 2020-10-22 DIAGNOSIS — Z7952 Long term (current) use of systemic steroids: Secondary | ICD-10-CM | POA: Diagnosis not present

## 2020-10-22 DIAGNOSIS — Z9484 Stem cells transplant status: Secondary | ICD-10-CM | POA: Diagnosis not present

## 2020-10-22 DIAGNOSIS — Z79899 Other long term (current) drug therapy: Secondary | ICD-10-CM | POA: Diagnosis not present

## 2020-10-22 DIAGNOSIS — F913 Oppositional defiant disorder: Secondary | ICD-10-CM | POA: Diagnosis not present

## 2020-10-22 DIAGNOSIS — L02214 Cutaneous abscess of groin: Secondary | ICD-10-CM | POA: Diagnosis not present

## 2020-10-22 DIAGNOSIS — R112 Nausea with vomiting, unspecified: Secondary | ICD-10-CM | POA: Diagnosis not present

## 2020-10-22 DIAGNOSIS — F329 Major depressive disorder, single episode, unspecified: Secondary | ICD-10-CM | POA: Diagnosis not present

## 2020-10-22 DIAGNOSIS — T451X5A Adverse effect of antineoplastic and immunosuppressive drugs, initial encounter: Secondary | ICD-10-CM | POA: Diagnosis not present

## 2020-10-24 DIAGNOSIS — C8110 Nodular sclerosis classical Hodgkin lymphoma, unspecified site: Secondary | ICD-10-CM | POA: Diagnosis not present

## 2020-10-24 DIAGNOSIS — Z9484 Stem cells transplant status: Secondary | ICD-10-CM | POA: Diagnosis not present

## 2020-10-26 ENCOUNTER — Telehealth: Payer: Self-pay | Admitting: Medical Oncology

## 2020-10-26 ENCOUNTER — Other Ambulatory Visit: Payer: Self-pay | Admitting: Medical Oncology

## 2020-10-26 DIAGNOSIS — C8111 Nodular sclerosis classical Hodgkin lymphoma, lymph nodes of head, face, and neck: Secondary | ICD-10-CM

## 2020-10-26 NOTE — Telephone Encounter (Signed)
F/U -Dr  Julien Nordmann will not provide follow up labs or visit on pt due to pt hx non-compliance. This was communicated to Heme-onc staff today . I told her Dr Tana Conch wants pt to get labs tomorrow.

## 2020-10-27 ENCOUNTER — Telehealth: Payer: Self-pay | Admitting: Internal Medicine

## 2020-10-27 ENCOUNTER — Inpatient Hospital Stay: Payer: Medicaid Other

## 2020-10-27 DIAGNOSIS — C8110 Nodular sclerosis classical Hodgkin lymphoma, unspecified site: Secondary | ICD-10-CM | POA: Diagnosis not present

## 2020-10-27 NOTE — Telephone Encounter (Signed)
Scheduled appt per 5/25 sch msg. When I spoke to pt he said that he had been contacted by [facility] and told to go to [facility] for his labs today. I scheduled the lab appt in July per sch msg, but did not schedule the appt today per pt's request.

## 2020-11-02 DIAGNOSIS — Z419 Encounter for procedure for purposes other than remedying health state, unspecified: Secondary | ICD-10-CM | POA: Diagnosis not present

## 2020-11-14 DIAGNOSIS — Z792 Long term (current) use of antibiotics: Secondary | ICD-10-CM | POA: Diagnosis not present

## 2020-11-14 DIAGNOSIS — Z9484 Stem cells transplant status: Secondary | ICD-10-CM | POA: Diagnosis not present

## 2020-11-14 DIAGNOSIS — D61810 Antineoplastic chemotherapy induced pancytopenia: Secondary | ICD-10-CM | POA: Diagnosis not present

## 2020-11-14 DIAGNOSIS — C8110 Nodular sclerosis classical Hodgkin lymphoma, unspecified site: Secondary | ICD-10-CM | POA: Diagnosis not present

## 2020-11-14 DIAGNOSIS — Z79899 Other long term (current) drug therapy: Secondary | ICD-10-CM | POA: Diagnosis not present

## 2020-12-02 DIAGNOSIS — Z419 Encounter for procedure for purposes other than remedying health state, unspecified: Secondary | ICD-10-CM | POA: Diagnosis not present

## 2020-12-09 ENCOUNTER — Other Ambulatory Visit: Payer: Self-pay | Admitting: Medical Oncology

## 2020-12-09 DIAGNOSIS — C8111 Nodular sclerosis classical Hodgkin lymphoma, lymph nodes of head, face, and neck: Secondary | ICD-10-CM

## 2020-12-12 ENCOUNTER — Other Ambulatory Visit: Payer: Medicaid Other

## 2020-12-12 ENCOUNTER — Inpatient Hospital Stay: Payer: Medicaid Other

## 2020-12-12 ENCOUNTER — Inpatient Hospital Stay: Payer: Medicaid Other | Attending: Internal Medicine

## 2020-12-12 ENCOUNTER — Ambulatory Visit: Payer: Medicaid Other | Admitting: Internal Medicine

## 2020-12-12 ENCOUNTER — Other Ambulatory Visit: Payer: Self-pay

## 2020-12-22 DIAGNOSIS — C8110 Nodular sclerosis classical Hodgkin lymphoma, unspecified site: Secondary | ICD-10-CM | POA: Diagnosis not present

## 2020-12-22 DIAGNOSIS — Z9484 Stem cells transplant status: Secondary | ICD-10-CM | POA: Diagnosis not present

## 2021-01-02 DIAGNOSIS — Z419 Encounter for procedure for purposes other than remedying health state, unspecified: Secondary | ICD-10-CM | POA: Diagnosis not present

## 2021-01-19 DIAGNOSIS — Z95828 Presence of other vascular implants and grafts: Secondary | ICD-10-CM | POA: Diagnosis not present

## 2021-01-19 DIAGNOSIS — C8110 Nodular sclerosis classical Hodgkin lymphoma, unspecified site: Secondary | ICD-10-CM | POA: Diagnosis not present

## 2021-01-19 DIAGNOSIS — Z9484 Stem cells transplant status: Secondary | ICD-10-CM | POA: Diagnosis not present

## 2021-01-19 DIAGNOSIS — Z888 Allergy status to other drugs, medicaments and biological substances status: Secondary | ICD-10-CM | POA: Diagnosis not present

## 2021-01-19 DIAGNOSIS — C8118 Nodular sclerosis classical Hodgkin lymphoma, lymph nodes of multiple sites: Secondary | ICD-10-CM | POA: Diagnosis not present

## 2021-02-02 DIAGNOSIS — Z419 Encounter for procedure for purposes other than remedying health state, unspecified: Secondary | ICD-10-CM | POA: Diagnosis not present

## 2021-02-08 DIAGNOSIS — Z5111 Encounter for antineoplastic chemotherapy: Secondary | ICD-10-CM | POA: Diagnosis not present

## 2021-02-08 DIAGNOSIS — Z9484 Stem cells transplant status: Secondary | ICD-10-CM | POA: Diagnosis not present

## 2021-02-08 DIAGNOSIS — Z888 Allergy status to other drugs, medicaments and biological substances status: Secondary | ICD-10-CM | POA: Diagnosis not present

## 2021-02-08 DIAGNOSIS — Z95828 Presence of other vascular implants and grafts: Secondary | ICD-10-CM | POA: Diagnosis not present

## 2021-02-08 DIAGNOSIS — Z516 Encounter for desensitization to allergens: Secondary | ICD-10-CM | POA: Diagnosis not present

## 2021-02-08 DIAGNOSIS — C8110 Nodular sclerosis classical Hodgkin lymphoma, unspecified site: Secondary | ICD-10-CM | POA: Diagnosis not present

## 2021-02-08 DIAGNOSIS — Z79899 Other long term (current) drug therapy: Secondary | ICD-10-CM | POA: Diagnosis not present

## 2021-03-02 DIAGNOSIS — Z9484 Stem cells transplant status: Secondary | ICD-10-CM | POA: Diagnosis not present

## 2021-03-02 DIAGNOSIS — Z516 Encounter for desensitization to allergens: Secondary | ICD-10-CM | POA: Diagnosis not present

## 2021-03-02 DIAGNOSIS — Z79899 Other long term (current) drug therapy: Secondary | ICD-10-CM | POA: Diagnosis not present

## 2021-03-02 DIAGNOSIS — D6481 Anemia due to antineoplastic chemotherapy: Secondary | ICD-10-CM | POA: Diagnosis not present

## 2021-03-02 DIAGNOSIS — C8110 Nodular sclerosis classical Hodgkin lymphoma, unspecified site: Secondary | ICD-10-CM | POA: Diagnosis not present

## 2021-03-02 DIAGNOSIS — Z5111 Encounter for antineoplastic chemotherapy: Secondary | ICD-10-CM | POA: Diagnosis not present

## 2021-03-04 DIAGNOSIS — Z419 Encounter for procedure for purposes other than remedying health state, unspecified: Secondary | ICD-10-CM | POA: Diagnosis not present

## 2021-03-23 DIAGNOSIS — Z7689 Persons encountering health services in other specified circumstances: Secondary | ICD-10-CM | POA: Diagnosis not present

## 2021-03-23 DIAGNOSIS — Z95828 Presence of other vascular implants and grafts: Secondary | ICD-10-CM | POA: Diagnosis not present

## 2021-03-23 DIAGNOSIS — C8110 Nodular sclerosis classical Hodgkin lymphoma, unspecified site: Secondary | ICD-10-CM | POA: Diagnosis not present

## 2021-03-23 DIAGNOSIS — Z5111 Encounter for antineoplastic chemotherapy: Secondary | ICD-10-CM | POA: Diagnosis not present

## 2021-04-04 DIAGNOSIS — Z419 Encounter for procedure for purposes other than remedying health state, unspecified: Secondary | ICD-10-CM | POA: Diagnosis not present

## 2021-04-13 DIAGNOSIS — Z9484 Stem cells transplant status: Secondary | ICD-10-CM | POA: Diagnosis not present

## 2021-04-13 DIAGNOSIS — Z7689 Persons encountering health services in other specified circumstances: Secondary | ICD-10-CM | POA: Diagnosis not present

## 2021-04-13 DIAGNOSIS — Z5111 Encounter for antineoplastic chemotherapy: Secondary | ICD-10-CM | POA: Diagnosis not present

## 2021-04-13 DIAGNOSIS — Z95828 Presence of other vascular implants and grafts: Secondary | ICD-10-CM | POA: Diagnosis not present

## 2021-04-13 DIAGNOSIS — C8190 Hodgkin lymphoma, unspecified, unspecified site: Secondary | ICD-10-CM | POA: Diagnosis not present

## 2021-04-13 DIAGNOSIS — Z9221 Personal history of antineoplastic chemotherapy: Secondary | ICD-10-CM | POA: Diagnosis not present

## 2021-04-13 DIAGNOSIS — C8110 Nodular sclerosis classical Hodgkin lymphoma, unspecified site: Secondary | ICD-10-CM | POA: Diagnosis not present

## 2021-04-13 DIAGNOSIS — Z516 Encounter for desensitization to allergens: Secondary | ICD-10-CM | POA: Diagnosis not present

## 2021-04-14 DIAGNOSIS — Z516 Encounter for desensitization to allergens: Secondary | ICD-10-CM | POA: Diagnosis not present

## 2021-04-14 DIAGNOSIS — C8110 Nodular sclerosis classical Hodgkin lymphoma, unspecified site: Secondary | ICD-10-CM | POA: Diagnosis not present

## 2021-05-04 DIAGNOSIS — Z419 Encounter for procedure for purposes other than remedying health state, unspecified: Secondary | ICD-10-CM | POA: Diagnosis not present

## 2021-05-08 DIAGNOSIS — Z5111 Encounter for antineoplastic chemotherapy: Secondary | ICD-10-CM | POA: Diagnosis not present

## 2021-05-08 DIAGNOSIS — Z516 Encounter for desensitization to allergens: Secondary | ICD-10-CM | POA: Diagnosis not present

## 2021-05-08 DIAGNOSIS — Z95828 Presence of other vascular implants and grafts: Secondary | ICD-10-CM | POA: Diagnosis not present

## 2021-05-08 DIAGNOSIS — C8110 Nodular sclerosis classical Hodgkin lymphoma, unspecified site: Secondary | ICD-10-CM | POA: Diagnosis not present

## 2021-05-08 DIAGNOSIS — Z9484 Stem cells transplant status: Secondary | ICD-10-CM | POA: Diagnosis not present

## 2021-05-09 DIAGNOSIS — C8110 Nodular sclerosis classical Hodgkin lymphoma, unspecified site: Secondary | ICD-10-CM | POA: Diagnosis not present

## 2021-06-04 DIAGNOSIS — Z419 Encounter for procedure for purposes other than remedying health state, unspecified: Secondary | ICD-10-CM | POA: Diagnosis not present

## 2021-06-12 DIAGNOSIS — Z7689 Persons encountering health services in other specified circumstances: Secondary | ICD-10-CM | POA: Diagnosis not present

## 2021-06-12 DIAGNOSIS — Z95828 Presence of other vascular implants and grafts: Secondary | ICD-10-CM | POA: Diagnosis not present

## 2021-06-12 DIAGNOSIS — Z9484 Stem cells transplant status: Secondary | ICD-10-CM | POA: Diagnosis not present

## 2021-06-12 DIAGNOSIS — C8110 Nodular sclerosis classical Hodgkin lymphoma, unspecified site: Secondary | ICD-10-CM | POA: Diagnosis not present

## 2021-06-12 DIAGNOSIS — Z516 Encounter for desensitization to allergens: Secondary | ICD-10-CM | POA: Diagnosis not present

## 2021-06-12 DIAGNOSIS — Z5111 Encounter for antineoplastic chemotherapy: Secondary | ICD-10-CM | POA: Diagnosis not present

## 2021-06-13 DIAGNOSIS — C8110 Nodular sclerosis classical Hodgkin lymphoma, unspecified site: Secondary | ICD-10-CM | POA: Diagnosis not present

## 2021-06-13 DIAGNOSIS — Z516 Encounter for desensitization to allergens: Secondary | ICD-10-CM | POA: Diagnosis not present

## 2021-06-13 DIAGNOSIS — Z7689 Persons encountering health services in other specified circumstances: Secondary | ICD-10-CM | POA: Diagnosis not present

## 2021-07-03 DIAGNOSIS — Z9484 Stem cells transplant status: Secondary | ICD-10-CM | POA: Diagnosis not present

## 2021-07-03 DIAGNOSIS — Z5111 Encounter for antineoplastic chemotherapy: Secondary | ICD-10-CM | POA: Diagnosis not present

## 2021-07-03 DIAGNOSIS — Z95828 Presence of other vascular implants and grafts: Secondary | ICD-10-CM | POA: Diagnosis not present

## 2021-07-03 DIAGNOSIS — C8110 Nodular sclerosis classical Hodgkin lymphoma, unspecified site: Secondary | ICD-10-CM | POA: Diagnosis not present

## 2021-07-04 DIAGNOSIS — Z516 Encounter for desensitization to allergens: Secondary | ICD-10-CM | POA: Diagnosis not present

## 2021-07-04 DIAGNOSIS — C8110 Nodular sclerosis classical Hodgkin lymphoma, unspecified site: Secondary | ICD-10-CM | POA: Diagnosis not present

## 2021-07-05 DIAGNOSIS — Z419 Encounter for procedure for purposes other than remedying health state, unspecified: Secondary | ICD-10-CM | POA: Diagnosis not present

## 2021-07-24 DIAGNOSIS — Z516 Encounter for desensitization to allergens: Secondary | ICD-10-CM | POA: Diagnosis not present

## 2021-07-24 DIAGNOSIS — Z5111 Encounter for antineoplastic chemotherapy: Secondary | ICD-10-CM | POA: Diagnosis not present

## 2021-07-24 DIAGNOSIS — C8110 Nodular sclerosis classical Hodgkin lymphoma, unspecified site: Secondary | ICD-10-CM | POA: Diagnosis not present

## 2021-07-24 DIAGNOSIS — Z9484 Stem cells transplant status: Secondary | ICD-10-CM | POA: Diagnosis not present

## 2021-07-24 DIAGNOSIS — C8118 Nodular sclerosis classical Hodgkin lymphoma, lymph nodes of multiple sites: Secondary | ICD-10-CM | POA: Diagnosis not present

## 2021-07-24 DIAGNOSIS — Z95828 Presence of other vascular implants and grafts: Secondary | ICD-10-CM | POA: Diagnosis not present

## 2021-07-25 DIAGNOSIS — Z516 Encounter for desensitization to allergens: Secondary | ICD-10-CM | POA: Diagnosis not present

## 2021-07-25 DIAGNOSIS — C8110 Nodular sclerosis classical Hodgkin lymphoma, unspecified site: Secondary | ICD-10-CM | POA: Diagnosis not present

## 2021-07-25 DIAGNOSIS — Z95828 Presence of other vascular implants and grafts: Secondary | ICD-10-CM | POA: Diagnosis not present

## 2021-07-25 DIAGNOSIS — Z9484 Stem cells transplant status: Secondary | ICD-10-CM | POA: Diagnosis not present

## 2021-08-02 DIAGNOSIS — Z419 Encounter for procedure for purposes other than remedying health state, unspecified: Secondary | ICD-10-CM | POA: Diagnosis not present

## 2021-08-21 DIAGNOSIS — Z95828 Presence of other vascular implants and grafts: Secondary | ICD-10-CM | POA: Diagnosis not present

## 2021-08-21 DIAGNOSIS — Z9484 Stem cells transplant status: Secondary | ICD-10-CM | POA: Diagnosis not present

## 2021-08-21 DIAGNOSIS — Z516 Encounter for desensitization to allergens: Secondary | ICD-10-CM | POA: Diagnosis not present

## 2021-08-21 DIAGNOSIS — C8110 Nodular sclerosis classical Hodgkin lymphoma, unspecified site: Secondary | ICD-10-CM | POA: Diagnosis not present

## 2021-08-21 DIAGNOSIS — Z5111 Encounter for antineoplastic chemotherapy: Secondary | ICD-10-CM | POA: Diagnosis not present

## 2021-08-22 DIAGNOSIS — C8110 Nodular sclerosis classical Hodgkin lymphoma, unspecified site: Secondary | ICD-10-CM | POA: Diagnosis not present

## 2021-08-22 DIAGNOSIS — Z516 Encounter for desensitization to allergens: Secondary | ICD-10-CM | POA: Diagnosis not present

## 2021-09-02 DIAGNOSIS — Z419 Encounter for procedure for purposes other than remedying health state, unspecified: Secondary | ICD-10-CM | POA: Diagnosis not present

## 2021-09-11 DIAGNOSIS — Z516 Encounter for desensitization to allergens: Secondary | ICD-10-CM | POA: Diagnosis not present

## 2021-09-11 DIAGNOSIS — Z95828 Presence of other vascular implants and grafts: Secondary | ICD-10-CM | POA: Diagnosis not present

## 2021-09-11 DIAGNOSIS — C8110 Nodular sclerosis classical Hodgkin lymphoma, unspecified site: Secondary | ICD-10-CM | POA: Diagnosis not present

## 2021-09-11 DIAGNOSIS — T451X5A Adverse effect of antineoplastic and immunosuppressive drugs, initial encounter: Secondary | ICD-10-CM | POA: Diagnosis not present

## 2021-09-11 DIAGNOSIS — Z9484 Stem cells transplant status: Secondary | ICD-10-CM | POA: Diagnosis not present

## 2021-09-11 DIAGNOSIS — D6481 Anemia due to antineoplastic chemotherapy: Secondary | ICD-10-CM | POA: Diagnosis not present

## 2021-09-11 DIAGNOSIS — Z5111 Encounter for antineoplastic chemotherapy: Secondary | ICD-10-CM | POA: Diagnosis not present

## 2021-09-12 DIAGNOSIS — Z5111 Encounter for antineoplastic chemotherapy: Secondary | ICD-10-CM | POA: Diagnosis not present

## 2021-09-12 DIAGNOSIS — C8110 Nodular sclerosis classical Hodgkin lymphoma, unspecified site: Secondary | ICD-10-CM | POA: Diagnosis not present

## 2021-09-12 DIAGNOSIS — Z516 Encounter for desensitization to allergens: Secondary | ICD-10-CM | POA: Diagnosis not present

## 2021-10-02 DIAGNOSIS — K760 Fatty (change of) liver, not elsewhere classified: Secondary | ICD-10-CM | POA: Diagnosis not present

## 2021-10-02 DIAGNOSIS — Z6831 Body mass index (BMI) 31.0-31.9, adult: Secondary | ICD-10-CM | POA: Diagnosis not present

## 2021-10-02 DIAGNOSIS — L218 Other seborrheic dermatitis: Secondary | ICD-10-CM | POA: Diagnosis not present

## 2021-10-02 DIAGNOSIS — Z5111 Encounter for antineoplastic chemotherapy: Secondary | ICD-10-CM | POA: Diagnosis not present

## 2021-10-02 DIAGNOSIS — Z516 Encounter for desensitization to allergens: Secondary | ICD-10-CM | POA: Diagnosis not present

## 2021-10-02 DIAGNOSIS — Z419 Encounter for procedure for purposes other than remedying health state, unspecified: Secondary | ICD-10-CM | POA: Diagnosis not present

## 2021-10-02 DIAGNOSIS — L210 Seborrhea capitis: Secondary | ICD-10-CM | POA: Diagnosis not present

## 2021-10-02 DIAGNOSIS — C8110 Nodular sclerosis classical Hodgkin lymphoma, unspecified site: Secondary | ICD-10-CM | POA: Diagnosis not present

## 2021-10-02 DIAGNOSIS — Z5941 Food insecurity: Secondary | ICD-10-CM | POA: Diagnosis not present

## 2021-10-02 DIAGNOSIS — K219 Gastro-esophageal reflux disease without esophagitis: Secondary | ICD-10-CM | POA: Diagnosis not present

## 2021-10-02 DIAGNOSIS — Z95828 Presence of other vascular implants and grafts: Secondary | ICD-10-CM | POA: Diagnosis not present

## 2021-10-02 DIAGNOSIS — L659 Nonscarring hair loss, unspecified: Secondary | ICD-10-CM | POA: Diagnosis not present

## 2021-10-02 DIAGNOSIS — E8881 Metabolic syndrome: Secondary | ICD-10-CM | POA: Diagnosis not present

## 2021-10-02 DIAGNOSIS — R7401 Elevation of levels of liver transaminase levels: Secondary | ICD-10-CM | POA: Diagnosis not present

## 2021-10-02 DIAGNOSIS — Z87892 Personal history of anaphylaxis: Secondary | ICD-10-CM | POA: Diagnosis not present

## 2021-10-03 DIAGNOSIS — Z516 Encounter for desensitization to allergens: Secondary | ICD-10-CM | POA: Diagnosis not present

## 2021-10-03 DIAGNOSIS — Z5941 Food insecurity: Secondary | ICD-10-CM | POA: Diagnosis not present

## 2021-10-03 DIAGNOSIS — K760 Fatty (change of) liver, not elsewhere classified: Secondary | ICD-10-CM | POA: Diagnosis not present

## 2021-10-03 DIAGNOSIS — C8110 Nodular sclerosis classical Hodgkin lymphoma, unspecified site: Secondary | ICD-10-CM | POA: Diagnosis not present

## 2021-10-12 DIAGNOSIS — Z5941 Food insecurity: Secondary | ICD-10-CM | POA: Diagnosis not present

## 2021-10-12 DIAGNOSIS — R059 Cough, unspecified: Secondary | ICD-10-CM | POA: Diagnosis not present

## 2021-10-12 DIAGNOSIS — Z9484 Stem cells transplant status: Secondary | ICD-10-CM | POA: Diagnosis not present

## 2021-10-12 DIAGNOSIS — Z79899 Other long term (current) drug therapy: Secondary | ICD-10-CM | POA: Diagnosis not present

## 2021-10-12 DIAGNOSIS — C8110 Nodular sclerosis classical Hodgkin lymphoma, unspecified site: Secondary | ICD-10-CM | POA: Diagnosis not present

## 2021-10-12 DIAGNOSIS — Z006 Encounter for examination for normal comparison and control in clinical research program: Secondary | ICD-10-CM | POA: Diagnosis not present

## 2021-10-12 DIAGNOSIS — Z23 Encounter for immunization: Secondary | ICD-10-CM | POA: Diagnosis not present

## 2021-10-12 DIAGNOSIS — Z9221 Personal history of antineoplastic chemotherapy: Secondary | ICD-10-CM | POA: Diagnosis not present

## 2021-10-12 DIAGNOSIS — Z5111 Encounter for antineoplastic chemotherapy: Secondary | ICD-10-CM | POA: Diagnosis not present

## 2021-10-12 DIAGNOSIS — J069 Acute upper respiratory infection, unspecified: Secondary | ICD-10-CM | POA: Diagnosis not present

## 2021-10-12 DIAGNOSIS — Z20822 Contact with and (suspected) exposure to covid-19: Secondary | ICD-10-CM | POA: Diagnosis not present

## 2021-11-01 DIAGNOSIS — C8111 Nodular sclerosis classical Hodgkin lymphoma, lymph nodes of head, face, and neck: Secondary | ICD-10-CM | POA: Diagnosis not present

## 2021-11-01 DIAGNOSIS — Z9484 Stem cells transplant status: Secondary | ICD-10-CM | POA: Diagnosis not present

## 2021-11-01 DIAGNOSIS — M542 Cervicalgia: Secondary | ICD-10-CM | POA: Diagnosis not present

## 2021-11-01 DIAGNOSIS — C8110 Nodular sclerosis classical Hodgkin lymphoma, unspecified site: Secondary | ICD-10-CM | POA: Diagnosis not present

## 2021-11-01 DIAGNOSIS — R590 Localized enlarged lymph nodes: Secondary | ICD-10-CM | POA: Diagnosis not present

## 2021-11-01 DIAGNOSIS — Z9221 Personal history of antineoplastic chemotherapy: Secondary | ICD-10-CM | POA: Diagnosis not present

## 2021-11-01 DIAGNOSIS — R1310 Dysphagia, unspecified: Secondary | ICD-10-CM | POA: Diagnosis not present

## 2021-11-01 DIAGNOSIS — Z79899 Other long term (current) drug therapy: Secondary | ICD-10-CM | POA: Diagnosis not present

## 2021-11-02 DIAGNOSIS — Z419 Encounter for procedure for purposes other than remedying health state, unspecified: Secondary | ICD-10-CM | POA: Diagnosis not present

## 2021-11-08 DIAGNOSIS — M542 Cervicalgia: Secondary | ICD-10-CM | POA: Diagnosis not present

## 2021-11-08 DIAGNOSIS — R1310 Dysphagia, unspecified: Secondary | ICD-10-CM | POA: Diagnosis not present

## 2021-11-08 DIAGNOSIS — Z7689 Persons encountering health services in other specified circumstances: Secondary | ICD-10-CM | POA: Diagnosis not present

## 2021-11-08 DIAGNOSIS — C8111 Nodular sclerosis classical Hodgkin lymphoma, lymph nodes of head, face, and neck: Secondary | ICD-10-CM | POA: Diagnosis not present

## 2021-11-24 ENCOUNTER — Other Ambulatory Visit: Payer: Self-pay | Admitting: Physician Assistant

## 2021-12-01 ENCOUNTER — Ambulatory Visit
Admission: EM | Admit: 2021-12-01 | Discharge: 2021-12-01 | Disposition: A | Discharge status: Home / Self Care | Payer: Medicaid Other

## 2021-12-01 DIAGNOSIS — Z4889 Encounter for other specified surgical aftercare: Secondary | ICD-10-CM

## 2021-12-01 DIAGNOSIS — C8190 Hodgkin lymphoma, unspecified, unspecified site: Secondary | ICD-10-CM | POA: Diagnosis not present

## 2021-12-01 DIAGNOSIS — C8111 Nodular sclerosis classical Hodgkin lymphoma, lymph nodes of head, face, and neck: Secondary | ICD-10-CM | POA: Diagnosis not present

## 2021-12-01 DIAGNOSIS — R58 Hemorrhage, not elsewhere classified: Secondary | ICD-10-CM | POA: Diagnosis not present

## 2021-12-01 DIAGNOSIS — C8110 Nodular sclerosis classical Hodgkin lymphoma, unspecified site: Secondary | ICD-10-CM | POA: Diagnosis not present

## 2021-12-01 DIAGNOSIS — T888XXA Other specified complications of surgical and medical care, not elsewhere classified, initial encounter: Secondary | ICD-10-CM | POA: Diagnosis not present

## 2021-12-01 DIAGNOSIS — R590 Localized enlarged lymph nodes: Secondary | ICD-10-CM | POA: Diagnosis not present

## 2021-12-01 NOTE — ED Triage Notes (Signed)
Patient states he had a cervical lymph node excisional biopsy completed today and states the dressing is drenched in blood. The dressing was saturated with blood that seeped out of the Tegaderm. The patient denies having lightheadedness and he was A&Ox4.

## 2021-12-01 NOTE — ED Provider Notes (Signed)
Patient seen in triage by provider, patient's mother is providing documented information of history: Patient brought by mother patient is status post right cervical node removal for biopsy today, d/c from [facility] in [place] at 2:00 pm after needing to return to OR due to bleeding post procedure.  He subsequently was cauterized by the surgeon to try and control bleeding.  Reviewed chart.  Patient was discharged with return precautions along with information to go to the ER if he had any significant bleeding or any other red flag symptoms.  Discussed with patient and mother that he needs to go immediately to the ER.  His vital signs are stable compared with vital signs taken postoperative procedure today.  Mother requested dressing to be changed, advised I cannot remove surgical dressings however dressings were reinforced with gauze and tape for pressure.  Patient endorsed that he felt stable alert and okay to ride with his mother.  He was ambulatory.  I did advise him to call the number on the discharge paperwork for further instructions on where to follow-up or go to [facility] as a surgical provider is always on call.  Mother verbalized understanding and agreement with plan.   Scot Jun, FNP 12/01/21 [phone]

## 2021-12-01 NOTE — ED Notes (Signed)
Patient is being discharged from the Urgent Care and sent to the Emergency Department via [facility]. Per Ashley Mariner FNP, patient is in need of higher level of care due to need for further evaluation. Patient is aware and verbalizes understanding of plan of care.  Vitals:   12/01/21 1750  BP: 100/62  Pulse: 61  Resp: 18  Temp: 97.7 F (36.5 C)  SpO2: 97%

## 2021-12-02 DIAGNOSIS — Z419 Encounter for procedure for purposes other than remedying health state, unspecified: Secondary | ICD-10-CM | POA: Diagnosis not present

## 2022-01-02 DIAGNOSIS — Z419 Encounter for procedure for purposes other than remedying health state, unspecified: Secondary | ICD-10-CM | POA: Diagnosis not present

## 2022-01-18 DIAGNOSIS — C8110 Nodular sclerosis classical Hodgkin lymphoma, unspecified site: Secondary | ICD-10-CM | POA: Diagnosis not present

## 2022-01-18 DIAGNOSIS — Z9484 Stem cells transplant status: Secondary | ICD-10-CM | POA: Diagnosis not present

## 2022-01-18 DIAGNOSIS — C8111 Nodular sclerosis classical Hodgkin lymphoma, lymph nodes of head, face, and neck: Secondary | ICD-10-CM | POA: Diagnosis not present

## 2022-01-18 DIAGNOSIS — Z23 Encounter for immunization: Secondary | ICD-10-CM | POA: Diagnosis not present

## 2022-02-02 DIAGNOSIS — Z419 Encounter for procedure for purposes other than remedying health state, unspecified: Secondary | ICD-10-CM | POA: Diagnosis not present

## 2023-04-24 NOTE — Telephone Encounter (Signed)
Telephone call  

## 2024-04-04 DEATH — deceased
